# Patient Record
Sex: Female | Born: 1970
Health system: Southern US, Community
[De-identification: ages and names within clinical notes are randomized; demographics above are authoritative.]

## PROBLEM LIST (undated history)

## (undated) DIAGNOSIS — K219 Gastro-esophageal reflux disease without esophagitis: Secondary | ICD-10-CM

## (undated) DIAGNOSIS — K5792 Diverticulitis of intestine, part unspecified, without perforation or abscess without bleeding: Secondary | ICD-10-CM

## (undated) DIAGNOSIS — C801 Malignant (primary) neoplasm, unspecified: Secondary | ICD-10-CM

## (undated) HISTORY — PX: SMALL INTESTINE SURGERY: SHX150

## (undated) HISTORY — PX: LAPAROSCOPIC BILATERAL SALPINGO OOPHERECTOMY: SHX5890

## (undated) HISTORY — DX: Diverticulitis of intestine, part unspecified, without perforation or abscess without bleeding: K57.92

## (undated) HISTORY — PX: REMOVAL OF GASTROINTESTINAL STOMATIC  TUMOR OF STOMACH: SHX6339

## (undated) HISTORY — PX: SPLENECTOMY, TOTAL: SHX788

## (undated) HISTORY — PX: COLON SURGERY: SHX602

## (undated) HISTORY — PX: ABDOMINAL HYSTERECTOMY: SHX81

---

## 2000-10-20 ENCOUNTER — Ambulatory Visit (HOSPITAL_COMMUNITY): Admission: RE | Admit: 2000-10-20 | Discharge: 2000-10-20 | Payer: Self-pay | Admitting: Internal Medicine

## 2000-10-20 ENCOUNTER — Encounter: Payer: Self-pay | Admitting: Internal Medicine

## 2000-11-02 ENCOUNTER — Encounter: Admission: RE | Admit: 2000-11-02 | Discharge: 2000-11-02 | Payer: Self-pay | Admitting: Oncology

## 2000-11-02 ENCOUNTER — Encounter (HOSPITAL_COMMUNITY): Payer: Self-pay | Admitting: Oncology

## 2000-11-02 ENCOUNTER — Encounter (HOSPITAL_COMMUNITY): Admission: RE | Admit: 2000-11-02 | Discharge: 2000-12-02 | Payer: Self-pay | Admitting: Oncology

## 2000-11-03 ENCOUNTER — Emergency Department (HOSPITAL_COMMUNITY): Admission: EM | Admit: 2000-11-03 | Discharge: 2000-11-03 | Payer: Self-pay | Admitting: *Deleted

## 2001-02-28 ENCOUNTER — Emergency Department (HOSPITAL_COMMUNITY): Admission: EM | Admit: 2001-02-28 | Discharge: 2001-02-28 | Payer: Self-pay | Admitting: Emergency Medicine

## 2001-03-26 ENCOUNTER — Inpatient Hospital Stay (HOSPITAL_COMMUNITY): Admission: EM | Admit: 2001-03-26 | Discharge: 2001-03-28 | Payer: Self-pay | Admitting: *Deleted

## 2001-08-17 ENCOUNTER — Inpatient Hospital Stay (HOSPITAL_COMMUNITY): Admission: RE | Admit: 2001-08-17 | Discharge: 2001-08-20 | Payer: Self-pay | Admitting: Obstetrics and Gynecology

## 2001-08-23 ENCOUNTER — Observation Stay (HOSPITAL_COMMUNITY): Admission: AD | Admit: 2001-08-23 | Discharge: 2001-08-24 | Payer: Self-pay | Admitting: Obstetrics & Gynecology

## 2001-08-23 ENCOUNTER — Encounter: Payer: Self-pay | Admitting: Obstetrics & Gynecology

## 2001-09-12 ENCOUNTER — Ambulatory Visit (HOSPITAL_COMMUNITY): Admission: RE | Admit: 2001-09-12 | Discharge: 2001-09-12 | Payer: Self-pay | Admitting: Family Medicine

## 2001-09-12 ENCOUNTER — Encounter: Payer: Self-pay | Admitting: Family Medicine

## 2002-08-17 ENCOUNTER — Encounter: Payer: Self-pay | Admitting: Family Medicine

## 2002-08-17 ENCOUNTER — Ambulatory Visit (HOSPITAL_COMMUNITY): Admission: RE | Admit: 2002-08-17 | Discharge: 2002-08-17 | Payer: Self-pay | Admitting: Family Medicine

## 2003-04-23 ENCOUNTER — Emergency Department (HOSPITAL_COMMUNITY): Admission: EM | Admit: 2003-04-23 | Discharge: 2003-04-23 | Payer: Self-pay | Admitting: Emergency Medicine

## 2003-10-05 ENCOUNTER — Ambulatory Visit (HOSPITAL_COMMUNITY): Admission: RE | Admit: 2003-10-05 | Discharge: 2003-10-05 | Payer: Self-pay | Admitting: Family Medicine

## 2003-10-27 ENCOUNTER — Emergency Department (HOSPITAL_COMMUNITY): Admission: EM | Admit: 2003-10-27 | Discharge: 2003-10-27 | Payer: Self-pay | Admitting: Emergency Medicine

## 2003-11-23 ENCOUNTER — Ambulatory Visit (HOSPITAL_COMMUNITY): Admission: RE | Admit: 2003-11-23 | Discharge: 2003-11-23 | Payer: Self-pay | Admitting: Family Medicine

## 2003-12-03 ENCOUNTER — Ambulatory Visit (HOSPITAL_COMMUNITY): Admission: RE | Admit: 2003-12-03 | Discharge: 2003-12-03 | Payer: Self-pay | Admitting: Family Medicine

## 2003-12-25 ENCOUNTER — Emergency Department (HOSPITAL_COMMUNITY): Admission: EM | Admit: 2003-12-25 | Discharge: 2003-12-25 | Payer: Self-pay | Admitting: *Deleted

## 2004-07-20 ENCOUNTER — Emergency Department (HOSPITAL_COMMUNITY): Admission: EM | Admit: 2004-07-20 | Discharge: 2004-07-20 | Payer: Self-pay | Admitting: Emergency Medicine

## 2004-11-17 ENCOUNTER — Emergency Department (HOSPITAL_COMMUNITY): Admission: EM | Admit: 2004-11-17 | Discharge: 2004-11-17 | Payer: Self-pay | Admitting: Emergency Medicine

## 2005-07-06 ENCOUNTER — Ambulatory Visit (HOSPITAL_COMMUNITY): Admission: RE | Admit: 2005-07-06 | Discharge: 2005-07-06 | Payer: Self-pay | Admitting: Family Medicine

## 2006-03-26 ENCOUNTER — Ambulatory Visit (HOSPITAL_COMMUNITY): Admission: RE | Admit: 2006-03-26 | Discharge: 2006-03-26 | Payer: Self-pay | Admitting: Podiatry

## 2006-07-04 ENCOUNTER — Emergency Department (HOSPITAL_COMMUNITY): Admission: EM | Admit: 2006-07-04 | Discharge: 2006-07-04 | Payer: Self-pay | Admitting: Emergency Medicine

## 2006-08-10 ENCOUNTER — Encounter (INDEPENDENT_AMBULATORY_CARE_PROVIDER_SITE_OTHER): Payer: Self-pay | Admitting: Specialist

## 2006-08-10 ENCOUNTER — Other Ambulatory Visit: Admission: RE | Admit: 2006-08-10 | Discharge: 2006-08-10 | Payer: Self-pay | Admitting: General Surgery

## 2006-08-16 ENCOUNTER — Ambulatory Visit: Payer: Self-pay | Admitting: Orthopedic Surgery

## 2006-08-19 ENCOUNTER — Ambulatory Visit (HOSPITAL_COMMUNITY): Admission: RE | Admit: 2006-08-19 | Discharge: 2006-08-19 | Payer: Self-pay | Admitting: Orthopedic Surgery

## 2006-08-26 ENCOUNTER — Ambulatory Visit: Payer: Self-pay | Admitting: Orthopedic Surgery

## 2007-09-04 ENCOUNTER — Ambulatory Visit: Payer: Self-pay | Admitting: Cardiology

## 2007-09-04 ENCOUNTER — Inpatient Hospital Stay (HOSPITAL_COMMUNITY): Admission: EM | Admit: 2007-09-04 | Discharge: 2007-09-05 | Payer: Self-pay | Admitting: Emergency Medicine

## 2007-09-09 ENCOUNTER — Ambulatory Visit (HOSPITAL_COMMUNITY): Admission: RE | Admit: 2007-09-09 | Discharge: 2007-09-09 | Payer: Self-pay | Admitting: Cardiology

## 2007-09-09 ENCOUNTER — Encounter: Payer: Self-pay | Admitting: Cardiology

## 2008-02-10 ENCOUNTER — Ambulatory Visit (HOSPITAL_COMMUNITY): Admission: RE | Admit: 2008-02-10 | Discharge: 2008-02-10 | Payer: Self-pay | Admitting: General Surgery

## 2008-10-18 ENCOUNTER — Emergency Department (HOSPITAL_COMMUNITY): Admission: EM | Admit: 2008-10-18 | Discharge: 2008-10-18 | Payer: Self-pay | Admitting: Emergency Medicine

## 2009-09-13 ENCOUNTER — Emergency Department (HOSPITAL_COMMUNITY): Admission: EM | Admit: 2009-09-13 | Discharge: 2009-09-13 | Payer: Self-pay | Admitting: Emergency Medicine

## 2010-06-08 ENCOUNTER — Emergency Department (HOSPITAL_COMMUNITY)
Admission: EM | Admit: 2010-06-08 | Discharge: 2010-06-08 | Payer: Self-pay | Source: Home / Self Care | Admitting: Emergency Medicine

## 2010-06-16 ENCOUNTER — Ambulatory Visit (HOSPITAL_COMMUNITY)
Admission: RE | Admit: 2010-06-16 | Discharge: 2010-06-16 | Payer: Self-pay | Source: Home / Self Care | Attending: Plastic Surgery | Admitting: Plastic Surgery

## 2010-07-19 NOTE — Op Note (Signed)
Peggy Franklin, Peggy Franklin                 ACCOUNT NO.:  [id number]  MEDICAL RECORD NO.:  [id number]          PATIENT TYPE:  AMB  LOCATION:  SDS                          FACILITY:  [facility]  PHYSICIAN:  Loreta Ave, MD DATE OF BIRTH:  1970/10/07  DATE OF PROCEDURE:  06/16/2010 DATE OF DISCHARGE:  06/16/2010                              OPERATIVE REPORT   PREOPERATIVE DIAGNOSIS:  Right distal radius fracture, intraarticular, 2- piece.  POSTOPERATIVE DIAGNOSIS:  Right distal radius fracture, intraarticular, 2-piece.  SURGEON:  Loreta Ave, MD  ASSISTANT:  Tonye Becket, nurse practitioner.  ANESTHESIA:  General.  TOURNIQUET TIME:  54 minutes at 250 mmHg.  ESTIMATED BLOOD LOSS:  Minimal.  CLINICAL INDICATION:  Peggy Franklin is a 40 year old female who sustained intraarticular and comminuted right distal radius fracture approximately 1 week ago.  I saw her in the outpatient setting as the hand surgeon on- call, evaluated her on physical examination and x-rays, and recommended operative treatment.  She understood the risks of surgery to include but not be limited to bleeding, infection, damage to nearby structures including bone, tendon, muscles, nerves, cartilage as well as stiffness, scarring, nonunion, and malunion.  She desired to proceed.  DESCRIPTION OF OPERATION:  The patient was brought to the operating room and placed in supine position on the operating room table.  One gram of Ancef was given preoperatively.  A well-padded pneumatic tourniquet was placed on the right arm.  After smooth and routine induction of general anesthesia, the right upper extremity was prepped with ChloraPrep and draped into a sterile field.  A standard volar Henry incision was made with a #15 blade and carried down through the skin and subcutaneous tissues sharply.  Superficial vessels were controlled with bipolar electrocautery.  The anterior aspect of the FCR tendon sheath was incised with a  #15 blade and the FCR tendon was retracted ulnarly.  The radial artery was retracted radially.  Sharp dissection revealed the flexor pollicis longus muscle belly which was retracted ulnarly.  The pronator quadratus was incised longitudinally with a #15 blade and dissected off the fracture fragment sharply in a subperiosteal fashion with a Art therapist.  The fracture was identified as being intraarticular and having 2 fracture fragments.  There was a butterfly piece of the volar aspect which was dislodged.  This volar component was elevated and using a combination of curette, rongeur, and a Therapist, nutritional the fracture fragments were freed up and provisionally reduced manually.  Next, a long narrow Acu-Loc volar distal radius plate was placed on the fracture.  A long plate was chosen because of the volar fracture fragment would have been in the region of the more distal screw holes in the shaft of the plate.  Next, the fracture was provisionally affixed to the plate with K-wires and assessed with fluoroscopy. Reduction and hardware alignment were found to be adequate.  Next, a 2.8- mm drill was used in the oblong hole of the shaft of the plate.  This was measured and a 3.5 nonlocking screw was placed which affixed to the plate to the radial shaft.  Next, the radial styloid  holes were drilled, measured and fully-threaded locking screws were then placed.  Next, the ulnar most distal hole was drilled, measured and a fully-threaded nonlocking screw was placed.  The distal most radial hole was then drilled, measured and a fully-threaded nonlocking screw was placed.  The remainder of the distal holes were drilled and were filled with fully- threaded locking screws.  Next, the shaft of the plate was affixed to the radial shaft.  This was done with 3.5 locking screws placed in a bicortical fashion.  Next, the entire construct with subsequent PA and lateral planes and reduction and hardware alignment  were found to be adequate.  Sponge and needle counts were reported as correct x2.  The wound was irrigated with normal saline and the pronator quadratus was repaired with interrupted 4-0 buried Monocryl sutures.  The skin was closed with buried interrupted 4-0 Monocryl sutures.  Steri-Strips, Xeroform as well as a dry sterile dressing, and a volar resting splint were then applied.  The patient tolerated the procedure well, was extubated and transferred to the recovery room in stable condition.     Loreta Ave, MD     CF/MEDQ  D:  06/16/2010  T:  06/17/2010  Job:  [id number]  Electronically Signed by Loreta Ave MD on 07/19/2010 03:16:08 PM

## 2010-09-01 LAB — SURGICAL PCR SCREEN: Staphylococcus aureus: NEGATIVE

## 2010-09-01 LAB — CBC
Hemoglobin: 14.9 g/dL (ref 12.0–15.0)
MCV: 91 fL (ref 78.0–100.0)
RBC: 4.65 MIL/uL (ref 3.87–5.11)
RDW: 12.1 % (ref 11.5–15.5)
WBC: 9.1 10*3/uL (ref 4.0–10.5)

## 2010-10-01 LAB — DIFFERENTIAL
Eosinophils Relative: 1 % (ref 0–5)
Monocytes Relative: 6 % (ref 3–12)
Neutro Abs: 3.4 10*3/uL (ref 1.7–7.7)
Neutrophils Relative %: 62 % (ref 43–77)

## 2010-10-01 LAB — CBC
Hemoglobin: 13.3 g/dL (ref 12.0–15.0)
MCHC: 35.3 g/dL (ref 30.0–36.0)
RBC: 4.17 MIL/uL (ref 3.87–5.11)

## 2010-10-01 LAB — COMPREHENSIVE METABOLIC PANEL
Alkaline Phosphatase: 60 U/L (ref 39–117)
BUN: 7 mg/dL (ref 6–23)
CO2: 26 mEq/L (ref 19–32)
Chloride: 100 mEq/L (ref 96–112)
Glucose, Bld: 80 mg/dL (ref 70–99)
Potassium: 3.5 mEq/L (ref 3.5–5.1)
Total Bilirubin: 0.7 mg/dL (ref 0.3–1.2)
Total Protein: 6 g/dL (ref 6.0–8.3)

## 2010-10-01 LAB — URINALYSIS, ROUTINE W REFLEX MICROSCOPIC
Bilirubin Urine: NEGATIVE
Glucose, UA: NEGATIVE mg/dL
Nitrite: NEGATIVE
Protein, ur: NEGATIVE mg/dL

## 2010-11-04 NOTE — H&P (Signed)
Peggy Franklin, Peggy Franklin                 ACCOUNT NO.:  [id number]   MEDICAL RECORD NO.:  [id number]          PATIENT TYPE:  INP   LOCATION:  A209                          FACILITY:  [facility]   PHYSICIAN:  Donna Bernard, M.D.DATE OF BIRTH:  May 12, 1971   DATE OF ADMISSION:  09/04/2007  DATE OF DISCHARGE:  LH                              HISTORY & PHYSICAL   CHIEF COMPLAINT:  Chest pain.   HISTORY OF PRESENT ILLNESS:  This patient is a 40 year old female who  presented to the emergency room on the day of admission with complaints  of chest pain.  She describes it as an elephant sitting on my chest.  It is primarily substernal, radiating to the left side, and deep.  The  patient did have some nausea with it, along with a sense of shortness of  breath.  There was no diaphoresis.  The patient does note that she had a  sister die of heart disease.  She also had a father who had early  bypass surgery.  The patient does smoke a pack per day.  She claims  minimal reflux symptomatology.  The patient notes that after  nitroglycerin sublingual x2 the patient had complete resolution of her  symptoms.   CURRENT MEDICATIONS:  None.   ALLERGIES:  MEDROL.   PAST SURGICAL HISTORY:  Remote cesarean section, hysterectomy, and  oophorectomy.   SOCIAL HISTORY:  No alcohol abuse.  Positive tobacco abuse.   REVIEW OF SYSTEMS:  Negative.   PHYSICAL EXAMINATION:  VITAL SIGNS:  Blood pressure 116/64, pulse 82,  respiratory rate 16.  Initial O2 saturation 97%.  GENERAL:  The patient is alert, positive anxiety.  HEENT:  Normal.  TM's normal.  Oropharynx normal.  NECK:  Supple.  LUNGS:  Clear.  HEART:  Regular rate and rhythm.  ABDOMEN:  Epigastrium tender to palpation.  No rebound and no guarding.  Good bowel sounds.  EXTREMITIES:  Normal.  NEUROLOGY:  Intact.   LABORATORY DATA:  Cardiac enzymes x1 negative.  Initial EKG nonspecific  T wave changes anteriorly, no significant T segment changes.   Portable  chest x-ray negative.   MET-7 within normal limits.  CBC within normal limits.   IMPRESSION:  Chest pain.  The patient gives a classic description for  angina-like pain.  She also has very significant risk factors with a  sibling with heart disease a father with premature coronary artery  disease and a personal smoking habit.  With the epigastric tenderness,  the probability is that this chest pain was esophageal spasm, however,  there is a possibility of underlying coronary ischemia as an etiology  also.  This all was discussed in length with the patient who  understandably is upset in part of her uninsure status.   PLAN:  Admit to the hospital with serial cardiac enzymes, CCMT, close  monitoring, nitroglycerin p.r.n., morphine p.r.n., and Zofran p.r.n.  Check EKG in the morning.  Cardiology consult.  Likely will need a  stress test after infarction is ruled out.  We will also cover with  Lovenox and aspirin.  Donna Bernard, M.D.  Electronically Signed     WSL/MEDQ  D:  09/04/2007  T:  09/04/2007  Job:  [id number]

## 2010-11-04 NOTE — Consult Note (Signed)
Peggy Franklin, RENNINGER                 ACCOUNT NO.:  [id number]   MEDICAL RECORD NO.:  [id number]          PATIENT TYPE:  INP   LOCATION:  A209                          FACILITY:  [facility]   PHYSICIAN:  Gerrit Friends. Dietrich Pates, MD, FACCDATE OF BIRTH:  05-Feb-1971   DATE OF CONSULTATION:  09/05/2007  DATE OF DISCHARGE:                                 CONSULTATION   HISTORY OF PRESENT ILLNESS:  A 40 year old woman admitted to the  hospital with 24 hours of chest discomfort.  Ms. Dezeeuw has no prior  cardiac history.  She has been a cigarette smoker, but has recently  tapered usage down to approximately one-quarter pack per day.  She has  not been told of lipid abnormalities in the past.  She has not had  diabetes.  There is a family history of coronary disease in her father  and a history of sudden death in a sister at age 11.  She had premature  menopause, having undergone unilateral oophorectomies in 2000 and then  again in 2003.  She ultimately had a hysterectomy.   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  This did not prevent her from continuing her work.  When she returned  home, she laid down, initially with improvement; however, symptoms  recurred.  When she returned to work the following day, her boss advised  her to return home.  She decided to go to the emergency department from  whence she was admitted.  The discomfort has radiated through to the  back.  There has been no associated dyspnea, diaphoresis or nausea.  She  did have some epigastric tenderness.  She has no history of  gastrointestinal problems.  She took ibuprofen at one point with  improvement.  She also received nitroglycerin in the emergency  department with uncertain initial results.  She has had no chest  discomfort since hospital admission.   PAST MEDICAL HISTORY:  1. Notable for a prior cesarean section on 3 occasions  plus the other      gynecologic procedures described above.  2. She has also had carpal tunnel surgery bilaterally.   ALLERGIES:  She reports an allergy to MEDROL.   MEDICATIONS:  She takes no medications routinely.   SOCIAL HISTORY:  No excessive use of alcohol; lives locally and has an  autistic son who requires considerable care.   REVIEW OF SYSTEMS:  Notable for a regular diet.  She had gestational  diabetes with all of her pregnancies.  All other systems reviewed and  are negative.   PHYSICAL EXAMINATION:  GENERAL:  A pleasant, well-appearing woman.  VITAL SIGNS:  The temperature is 97.8, blood pressure 100/55, heart rate  65 and regular, respirations 20, O2 saturation 98%.  HEENT:  Extraocular movements are full; normal lids and conjunctivae;  normal oral mucosa.  PSYCHIATRIC:  Alert and oriented; normal affect.  NECK:  No jugular venous distention; normal carotid upstrokes without  bruits.  ENDOCRINE:  No thyromegaly.  HEMATOPOIETIC:  No adenopathy.  LUNGS:  Clear.  CARDIAC:  Normal first and second heart sounds; normal PMI.  ABDOMEN:  Soft and nontender; no organomegaly; no masses.  EXTREMITIES:  Normal distal pulses; no edema.  NEUROLOGIC:  Symmetric strength and tone; normal cranial nerves.   ELECTROCARDIOGRAM:  Normal sinus rhythm; low voltage in the limb leads;  nondiagnostic T-wave abnormalities in leads V2-V3.   Other laboratory is unremarkable.  Chest x-ray is normal.  Cardiac  markers are negative, as is a D-dimer.   IMPRESSION:  Ms. Stanke presents with symptoms compatible with chest pain  of cardiac origin, but with a continuing low risk for coronary disease  despite a positive family history, premature menopause and use of  tobacco products.  The likelihood of a GI origin exceeds that of the  possibility of cardiac disease.  Nonetheless, stress testing is  warranted.  This can be done on an outpatient basis in the form of a  stress echocardiogram.  The  patient will be discharged on an H2 blocker  and sublingual nitroglycerin p.r.n.   I will plan to see this nice woman again in the office after stress  testing has been completed.  We greatly appreciate the opportunity to  participate in her care.      Gerrit Friends. Dietrich Pates, MD, [facility]  Electronically Signed     RMR/MEDQ  D:  09/05/2007  T:  09/05/2007  Job:  [id number]

## 2010-11-04 NOTE — Discharge Summary (Signed)
NAMECLOIE, WOODEN                 ACCOUNT NO.:  [id number]   MEDICAL RECORD NO.:  [id number]          PATIENT TYPE:  INP   LOCATION:  A209                          FACILITY:  [facility]   PHYSICIAN:  Scott A. Gerda Diss, MD    DATE OF BIRTH:  1971/03/31   DATE OF ADMISSION:  09/04/2007  DATE OF DISCHARGE:  03/16/2009LH                               DISCHARGE SUMMARY   DISCHARGE DIAGNOSES:  1. Chest pain, problem noncardiac.  2. Family history of heart disease.  3. Tobacco abuse.  4. Probable esophageal reflux.   Was sent home with the orders of no smoking, Zantac by mouth, and to  follow up for a stress test with Cardiology plus also follow up in the  office in 1 week.   Her hospital course is uneventful.  She did see cardiology, which did  not feel like anything needed to be done while in the hospital other  than what has already been done.  Her enzymes ruled her out for any  heart attack.  She was sent home in good condition.      Scott A. Gerda Diss, MD  Electronically Signed     SAL/MEDQ  D:  10/17/2007  T:  10/17/2007  Job:  [id number]

## 2010-11-04 NOTE — Procedures (Signed)
[name], Peggy Franklin                 ACCOUNT NO.:  [id number]   MEDICAL RECORD NO.:  [id number]          PATIENT TYPE:  OUT   LOCATION:  RAD                           FACILITY:  [facility]   PHYSICIAN:  Gerrit Friends. Dietrich Pates, MD, FACCDATE OF BIRTH:  1971-05-08   DATE OF PROCEDURE:  09/09/2007  DATE OF DISCHARGE:                                ECHOCARDIOGRAM   PROCEDURE:  Stress echocardiogram.   REFERRING PHYSICIAN:  Scott A. Gerda Diss, MD   CLINICAL DATA:  A 40 year old woman with multiple cardiovascular risk  factors recently admitted to hospital with chest pain.  Myocardial  infarction ruled out.   1. Treadmill exercise performed to a workload of 10 METS and a heart      rate of 178, 97% of age predicted maximum.  Exercise discontinued      due to fatigue and dyspnea; no chest discomfort reported.  2. Blood pressure increased from a resting value of 110/60 to 120/60      early in recovery, a flat response.  3. No arrhythmias noted.  4. Baseline EKG:  Normal sinus rhythm; minor nonspecific T-wave      abnormality.   Stress EKG:  Interpretation impaired by baseline wander and artifact; no  definite ST-segment abnormalities identified.  Baseline echocardiogram  shows normal chamber dimensions, normal aortic and mitral valves and  normal right and left ventricular function.   Post exercise imaging:  There is a clear increase in contractility in  the inferior and posterolateral regions.  There is little change noted  in the anterior and anterolateral segments.   IMPRESSION:  Probably negative stress echocardiogram revealing good  exercise tolerance, no reproduction of the patient's chest discomfort,  no diagnostic stress induced EKG abnormalities and a nondiagnostic  echocardiographic response in the anterior myocardium.  Other myocardial  region showed no evidence for ischemia or infarction.      Gerrit Friends. Dietrich Pates, MD, [facility]  Electronically Signed     RMR/MEDQ  D:  09/09/2007  T:   09/09/2007  Job:  [id number]

## 2010-11-04 NOTE — Group Therapy Note (Signed)
[name], Peggy Franklin                 ACCOUNT NO.:  [id number]   MEDICAL RECORD NO.:  [id number]          PATIENT TYPE:  INP   LOCATION:  A209                          FACILITY:  [facility]   PHYSICIAN:  Scott A. Gerda Diss, MD    DATE OF BIRTH:  June 01, 1971   DATE OF PROCEDURE:  09/05/2007  DATE OF DISCHARGE:                                 PROGRESS NOTE   Overnight she did pretty well.  She did not have any chest pressure,  pain, discomfort or shortness of breath.  She denies any nausea,  vomiting.  She has eaten a little bit of food.  Her lab results came  back looking cardiac enzymes all being negative.   PHYSICAL EXAMINATION:  LUNGS:  Clear.  No crackles, no respiratory  distress.  CHEST:  Chest wall nontender.  HEART:  Regular, no murmurs.  ABDOMEN:  Soft, no epigastric tenderness.   A/P chest pain - resolved.  Overall I feel there is a low likelihood of  cardiac disease, but because of her risk factors it does need to be  ruled out.  Certainly this could be tested while in the hospital or  tested as an outpatient.  Cardiology to see today and help guide that.  Certainly if they feel that testing can be done as an outpatient we will  let her go home today, and follow her up closely in the office as well  as cardiology following her up.  In addition to all of this I have  encouraged her to quit smoking.  I also feel omeprazole as an outpatient  one per day would be a reasonable thing to do, and certainly work up  gallbladder and erosive esophageal disease could be done as well  depending on clinical course.      Scott A. Gerda Diss, MD  Electronically Signed     SAL/MEDQ  D:  09/05/2007  T:  09/05/2007  Job:  [id number]

## 2010-11-07 NOTE — Op Note (Signed)
[facility]  Patient:    Peggy Franklin, Peggy Franklin Visit Number: [id number] MRN: [id number]          Service Type: OUT Location: RAD Attending Physician:  Darlin Priestly Dictated by:   Christin Bach, M.D. Proc. Date: 08/17/01 Admit Date:  09/12/2001                             Operative Report  ADMITTING DIAGNOSES:  Right ovarian cyst; history of extensive pelvic adhesions.  POSTOPERATIVE DIAGNOSIS:  Right ovarian cyst.  PROCEDURE:  Right salpingo-oophorectomy.  SURGEON:  Christin Bach, M.D.  ASSISTANTMarlinda Mike, R.N.  ANESTHESIA:  General.  COMPLICATIONS:  None.  ESTIMATED BLOOD LOSS:  200 cc.  FINDINGS:  Omental adhesions to anterior abdominal wall and bladder abdominal area, and diffuse oozing throughout the lower abdomen. JP drains noted, and JP drain in the pelvis necessary.  DETAILS OF PROCEDURE:  The patient was taken to the operating room and prepped and draped in the usual fashion for lower abdominal surgery. The previous lower abdominal incision, a vertical laparotomy incision was performed at the site of the previous incision in October of last year. Some technical challenges were encountered identifying the abdominal cavity as we encountered omental fat and had some challenges separating it from the anterior abdominal wall. At no time was bowel jeopardized by this process. The bowel had been prepped the night before. A tendency toward diffuse oozing was encountered throughout the procedure. The omentum was gradually dissected free from the peritoneal surfaces and dissected free on either side. The tip of the omentum was adherent all the way down to the bladder dome area. Some of it was so dense as to be difficult to delineate from the bladder itself, so we worked around these adhesions. The right adnexa could be identified, and the tube and ovary identified on the pelvic sidewall and sharply dissected free from adhesions to the  sidewall. The retroperitoneal space was entered between the round ligament and the pelvic sidewall, and the infundibulopelvic ligament isolated. The ureter was identified retroperitoneally and confirmed as being out of harms way. The IP ligament was then clamped, cut, and suture ligated. The tube and ovary were then shelled off of the sidewall with ease, with Kelly clamps used to crossclamp the remaining attachments to the remnants of the round ligament. These were transected, tied down, and the procedure was considered satisfactorily completed. Diffuse oozing required point cautery as applicable, but was considered sufficient, but the JP drain was considered necessary. No frank bleeding was encountered during this portion of the case. We then prepped, we then placed the JP drain through a second stabbing through the inferior aspects of the incision and closed the peritoneal cavity with 2-0 chromic, the fascia with continuous 0 Flexon, and subcu tissues were reapproximated using interrupted 2-0 plain. Staple closure of the skin completed the procedure with an estimated blood loss of 200 cc. Dictated by:   Christin Bach, M.D. Attending Physician:  Darlin Priestly DD:  09/12/01 TD:  09/13/01 Job: [id number]

## 2010-11-07 NOTE — H&P (Signed)
[facility]  Patient:    Peggy Franklin, Peggy Franklin Visit Number: [id number] MRN: [id number]          Service Type: Attending:  Christin Bach, M.D. Dictated by:   Christin Bach, M.D.                           History and Physical    ADMITTING DIAGNOSIS:  Painful right ovarian cyst, history of extensive pelvic adhesions scheduled for laparotomy, right salpingo-oophorectomy on August 17, 2001.  HISTORY OF PRESENT ILLNESS:  This 40 year old female now four months status post laparotomy for ovarian cyst and infarcted epiploic fat with adhesions is admitted at this time for removal of the remaining ovary which is on the right.  [name] has been seen in our office on August 15, 2001, after severe right-sided pain.  She "feels just like the left side did before her surgery in 2002."  She had to leave home from work over the weekend.  She is "sick and tired of the pain."  Seen in our office, she is revealing a 3.9 x 4.4 x 3.7 cm right ovary with multiple cysts up to 1.7 cm in diameter.  This is immobile and adherent to the right side wall.  Contact with the ultrasound probe in the area of the ovary reproduces the pelvic pain.  The patient has already had laparotomy removal of the left ovary and identification infarcted epiploic fat of the sigmoid colon.  She also has a small hernia in the pelvic floor.  The omentum was adherent to the anterior abdominal wall.  Laparotomy was performed through a vertical abdominal incision.  The patient is aware that surgical removal of the ovary will result in surgical menopause and that she will be having to decide whether to take hormone therapy due to surgical menopause. Patient is very comfortable with the idea of hormone replacement therapy.  PAST MEDICAL HISTORY:  Benign surgical history.  C sections x 3, abdominal hysterectomy.  ALLERGIES:  No known drug allergies.  MEDICATIONS:  Lortab, Motrin p.r.n., Tylox prescription  written two days ago with preoperative visit.  PHYSICAL EXAMINATION: VITAL SIGNS:  Height 5 feet 5 inches.  Weight 141.  Blood presure 100/60. GENERAL:  A slim Caucasian female in obvious discomfort.  Alert and oriented x 3.  CARDIOVASCULAR:  Unremarkable.  ABDOMEN:  Well-healed midline abdominal scar.  GENITALIA:  Normal. PELVIC:  Vaginal exam normal secretions.  Cuff smooth and well healed.  LABORATORY DATEA:  Vaginal ultrasound performed showed previously mentioned 3.7 x 3.9 x 4.4 cm right ovary cyst.  PLAN:  Right salpino-oophorectomy through laparotomy incision on August 16, 2001. Dictated by:   Christin Bach, M.D. Attending:  Christin Bach, M.D. DD:  08/16/01 TD:  08/16/01 Job: [id number]

## 2010-11-07 NOTE — Op Note (Signed)
[facility]  Patient:    Peggy Franklin, Peggy Franklin Visit Number: [id number] MRN: [id number]          Service Type: MED Location: 4 A428 01 Attending Physician:  Tilda Burrow Dictated by:   Christin Bach, M.D. Admit Date:  03/26/2001 Discharge Date: 03/28/2001   CC:         Barbaraann Barthel, M.D.   Operative Report  PREOPERATIVE DIAGNOSIS:  Pelvic pain, suspected left ovarian cyst, rule out adnexal torsion.  POSTOPERATIVE DIAGNOSES:  Pelvic pain secondary to left ovarian cyst, multiple adhesions, infarcted epiploic fat of sigmoid colon, pelvic floor hernia.  PROCEDURE:  Diagnostic laparoscopy, laparotomy with left salpingo-oophorectomy, lysis of adhesions, appendectomy, closure of pelvic floor hernia.  SURGEON:  Christin Bach, M.D.  ASSISTANTAmie Critchley, CST  INTRAOPERATIVE CONSULTANT AND SUBSEQUENT ASSISTANT:  Barbaraann Barthel, M.D.  COMPLICATIONS:  Extensive adhesions of omentum to the entire anterior abdominal wall prevented any visibility of the pelvis.  Underside of omentum was clear of adhesions.  Adnexal structures completely obscured.  DETAILS OF PROCEDURE:  The patient was taken to the operating room and prepped and draped for combined abdominal and vaginal procedure, with Foley catheter inserted and single-tooth tenaculum attached to the vaginal apex for orientation and identification of the vaginal cuff.  The legs were supported in yellow fin leg supports and the abdomen prepped and draped.  Infraumbilical vertical 1-cm skin incision was made, approximately 2 cm above the upper aspects of the midline abdominal incisions, and Veress needle introduced and water drop technique used to confirm intraperitoneal placement.  Insufflation of 3 L CO2 was performed without difficulty and then the laparoscopic trocar introduced with orientation toward the pelvis.  Visualization inside revealed that we were beneath an extremely large widespread  omentum which was attached through to the anterior abdominal wall from one side to the other and all the way to the pelvic floor.  The small bowel could be seen under it and was normal in appearance except for a small bit of CO2, which was in the mesentery to the small bowel, related to the insufflation process.  We looked around and after some manipulation of the omentum from underneath, realized that there would be absolutely no ability to free up the omentum laparoscopically.  We then decided to convert to open procedure.  The abdomen was kept inflated, the umbilical trocar left in place, the cameras and laparoscopic equipment removed and the patient opened through the prior vertical lower abdominal incision with wide excision of the old cicatrix. Peritoneal cavity was entered with some significant difficulty due to the extensive matted adhesions to the anterior abdominal wall, which were dissected free gradually.  Most of these adhesions were old and once the cleavage planes could be identified, the dissection went quite well.  The tip of the omentum was thickened and somewhat indurated and stuck down in the left lower quadrant over a palpable mass effect.  Balfour retractor was in place and attention directed to the pelvis.  As we gradually dissected the adhesions free, the left adnexa could be identified.  The omentum was dissected off of the top of the sigmoid colon, which was attached to the top of the left ovary. Upon freeing up the bowel, there was a thickened, matted, indurated portion of the sigmoid which was quite lax and mobile once freed up, but it showed extensive reddened erythema and some ecchymosis over its surface that was long-standing.  Original impression was that it perhaps represented an inflammatory  process such as diverticulitis.  The epiploic fatty appendages along the side of the sigmoid colon were matted over the top of the bowel surface.  A decision was made  to obtain intraoperative consult to confirm that this was a surface inflammatory process and not intrinsic bowel problem.  A call was made to Dr. Scarlett Presto for intraoperative consultation.  During the time of his arrival, we proceeded with surgical evaluation of the pelvis, which had included complete mobilization of the left tube and ovary from the pelvic sidewall, identification of the left ureter, identification of a retroperitoneal site that the IP ligament could be isolated, and gradual careful dissection of the epiploic fat appendages free from the surface of the sigmoid colon.  As we dissected the fat free from the underlying colon, it was obvious that the problem was the surface of the epiploic fat rather than the bowel itself.  It appeared that one of the epiploic fat appendages had become infarcted from its entrapment beneath the adjacent adhesions and that this was resulting in the fairly significant inflammatory process.  This was confirmed by Dr. Malvin Johns upon his arrival.  Rest of pelvis was inspected by Dr. Malvin Johns in his intraoperative consult note.  Decision was made to proceed with appendectomy as well as the left salpingo-oophorectomy.  In a combined fashion, Dr. Malvin Johns and I then proceeded to remove the appendix using standard technique after the bowel had been explored along its entirety manually and visually as careful as possible.  The right upper quadrant did not have any adhesions.  The Endo GIA was used to cross the appendiceal base.  The mesoappendix portions were individually ligated with 2-0 silk.  The left tube and ovary were placed on traction with Balfour retractor, the ureter carefully identified to ensure its being out of the surgical field and the IP ligament -- infundibulopelvic ligament -- on the left isolated, doubly clamped with Kelly clamps, transected and ligated with 0 chromic.  The left tube and ovary were then shaved off of the  remaining adhesions to the sidewall  and two small areas of adhesions grasped with Kelly clamps, transected and then tied with 0 chromic.  We stayed out of the retroperitoneum.  The ureter was felt well out of harms way throughout this portion of the procedure.  The pelvis was irrigated copiously, inspected and a small 2 x 1 x 2-cm deep hernia defect on the left side of the colon behind the vagina noted.  This was closed with a couple of stitches of 2-0 chromic placed very superficially to avoid underlying structures.  Consideration was given to a Moschcowitz procedure, as the patient had extreme pelvic laxity.  The laxity of the structures was so extensive that we felt that orientation of structures such as ureters, etc, might not be traditional in location, so a decision to try to reduce the pelvic floor weakness was discontinued.  We then proceeded to close the abdomen with continuous running 0 Maxon, followed by subcutaneous irrigation and interrupted 2-0 plain tissue approximation and staple closure of the skin.  Estimated blood loss was 250 cc. Dictated by:   Christin Bach, M.D. Attending Physician:  Tilda Burrow DD:  03/26/01 TD:  03/28/01 Job: [id number]

## 2010-11-07 NOTE — Op Note (Signed)
[facility]  Patient:    Peggy Franklin, Peggy Franklin Visit Number: [id number] MRN: [id number]          Service Type: MED Location: 4 A428 01 Attending Physician:  Tilda Burrow Dictated by:   Barbaraann Barthel, M.D. Proc. Date: 03/26/01 Admit Date:  03/26/2001 Discharge Date: 03/28/2001   CC:         Christin Bach, M.D.   Operative Report  SURGEON:  Christin Bach, M.D.  ASSISTANT:  Barbaraann Barthel, M.D.  NOTE:  This is a 40 year old female who was taken to surgery on Dr. Robinette Haines service.  An intraoperative consult was requested and surgery responded immediately.  Dr. Emelda Fear had an attempted laparoscopic procedure, encountered dense adhesions and then later opened this patient infraumbilically in the midline, where he noted that the rectosigmoid appendices epiploicae were scarred and adhesed and edematous.  This was related to the adhesions to the left adnexa. I examined the bowel with him and concurred and no further bowel surgery was necessary.  I assisted in lysing with the adhesions and we then proceeded with a left salpingo-oophorectomy.  For details of this procedure, consult Dr. Robinette Haines notes.  I also suggested the need for an incidental appendectomy, as palpated fecaliths were noted, and it is likely that this female may very well encounter pelvic pain, in which the presence of an appendix does not need to add to the diagnostic dilemma.  This was done, ligating the mesoappendix with 2-0 silk and using the TA-30 stapler device.  An omental biopsy was performed.  The wound was then irrigated and abdominal closure was obtained. For details of the operative procedure, see Dr. Robinette Haines notes.  SUGGESTIONS:  In order to thoroughly clarify the status of the bowel, I suggested colonoscopy in the postoperative period, #1, and also suggested omental biopsy. Dictated by:   Barbaraann Barthel, M.D. Attending Physician:  Tilda Burrow DD:   03/26/01 TD:  03/28/01 Job: [id number]

## 2010-11-07 NOTE — H&P (Signed)
[facility]  Patient:    Peggy Franklin, Peggy Franklin Visit Number: [id number] MRN: [id number]          Service Type: OBV Location: 3A A336 01 Attending Physician:  Lazaro Arms Dictated by:   Duane Lope, M.D. Admit Date:  08/23/2001                           History and Physical  HISTORY OF PRESENT ILLNESS:  The patient is a 40 year old white female, gravida 3, para 3, who has had now six laparotomy surgeries, three C sections, abdominal hysterectomy, left salpingo-oophorectomy and subsequently last week a right salpingo-oophorectomy.   The patient underwent surgery again approximately one week ago by Dr. Emelda Fear and had a relatively uneventful intraoperative and postoperative course.  Complicating matters in general are all the laparotomies the patient has had and, of course, there was abundant adhesive disease found at the time of surgery last week to remove the right tube and ovary.  There were some omental adhesions, some bowel adhesions, some infarcted and incarcerated omental fat and epiploic fat, but the patient had an uneventful postoperative course and in fact was doing quite well as of March 2.  She was at her grandmother.  She was without pain and having normal bowel function.  However, on the night prior to admission on August 22, 2001, the patient stated she was "up all night" with nausea, vomiting, and watery diarrhea.  She denies fever.  She states the emesis has not been bilious, and she also states that she feels more bloated now than she did when she was in the hospital postoperatively.  In fact, she states she feels worse now than she did immediately postoperatively.  She states that no one else in the family has been ill recently.  On examination in the office, she has normal bowel sounds, no rushes, no tinkles.  Her incision is clean, dry, and intact.  There is, I would say, mild distention but certainly not a clear cut case of obstruction.   However, the patient is admitted for observation status, for hydration, laboratory evaluation, radiologic evaluation, and time to see how her symptoms evolve. The patient and her husband are aware of this logic and agree to admission.  PAST MEDICAL HISTORY:  Essentially negative.  PAST SURGICAL HISTORY:  As stated above.  MEDICATIONS:  Tylox for pain, otherwise negative.  REVIEW OF SYSTEMS:  As per HPI, otherwise negative.  PHYSICAL EXAMINATION:  VITAL SIGNS:  Height 5 feet 1 inch, 135 pounds.  HEENT:  Unremarkable.  NECK:  Thyroid is normal.  LUNGS:  Clear.  HEART:  Regular rate and rhythm without regurgitation or gallop.  BREASTS:  Deferred.  ABDOMEN:  As stated above.  She has staples in the midline from her midline incision which is healing quite well with no evidence of infection, abscess, hematoma, or seroma.  Her abdomen is mildly distended.  There are good bowel sounds in all four quadrants.  There is no rebound, and there is no involuntary guarding.  There are no rushes or tinkles.  PELVIC:  Exam was not performed.  EXTREMITIES:  Warm with no edema.  NEUROLOGIC:  Grossly intact.  IMPRESSION: 1. Six days postoperatively from exploratory laparotomy with right    salpingo-oophorectomy with findings at the time of surgery of widespread    peritoneal adhesive disease. 2. New onset nausea, vomiting, and diarrhea.  PLAN:  The patient will be admitted for IV  hydration, laboratory evaluation including comprehensive metabolic panel.  She will have an acute abdominal series.  She will be rehydrated, and we will observe her status n.p.o.  The patient understands this may represent simply a coincidental viral gastroenteritis.  We will also check a C. difficile toxin if she has any more diarrhea.  We will control her nausea with Zofran and given her IV Stadol for pain.  She also understands this may represent an ileus or possibly even early bowel obstruction given her  adhesive disease from last week.  She understands this may take some time to delineate, and she may be in just tonight or a couple of days. Dictated by:   Duane Lope, M.D. Attending Physician:  Lazaro Arms DD:  08/23/01 TD:  08/23/01 Job: [id number]

## 2010-11-07 NOTE — Discharge Summary (Signed)
[facility]  Patient:    Peggy Franklin, Peggy Franklin Visit Number: [id number] MRN: [id number]          Service Type: MED Location: 4 A428 01 Attending Physician:  Tilda Burrow Dictated by:   Christin Bach, M.D. Admit Date:  03/26/2001 Discharge Date: 03/28/2001               Scarlett Presto, M.D.   Discharge Summary  ADMITTING DIAGNOSES:  Left ovarian cyst, rule out ovarian torsion, severe left lower quadrant pain.  DISCHARGE DIAGNOSIS:  Pelvic pain secondary to left ovarian cyst, pericolonic adhesions with infarction of epiploic fat.  PROCEDURE:  Diagnostic laparoscopy, laparotomy with lysis of adhesions, left salpingo-oophorectomy.  Appendectomy and biopsy of omentum.  DISCHARGE MEDICATIONS: 1. Tylox 1 q.4h. p.r.n. pain. 2. Laxative of choice p.o. q.d.  CONSULTANT:  Intraoperative consultant Dr. Scarlett Presto.  BRIEF HISTORY:  This 40 year old female gravida 3, para 3, status post C-section x 3 as well as hysterectomy has been followed for left lower quadrant pain for the past month with ultrasound confirming left ovarian cyst treated with oral contraceptives with only partial relief of the pain.  The birth control pills were stopped 1 week ago.  The patient presented to the emergency room with severe  left lower quadrant pain for 12 hours not responding to p.o. Lortab plus Motrin 800s and described as worse than her hysterectomy in pain severity.  Inpatient IV analgesics versus surgical evaluation reviewed and we decided to operate.  REVIEW OF SYSTEMS:  Notable for normal bowel function with normal bowel movemenT at 4 a.m. with no change in discomfort level.  SOCIAL HISTORY:  C-section x 3, abdominal.  ALLERGIES:  No known drug allergies.  MEDICATIONS: 1. Lortab. 2. Motrin. 3. Birth control pills until 1 week ago.  PHYSICAL EXAMINATION:  VITAL SIGNS:  Height 5 feet 1 inch, weight 135 pounds with abdomen showing relaxed abdominal wall,  vertical lower abdominal scar Pfannenstiel type incision, for with initial C-section.  VAGINAL EXAM:  Normal.  Bimanual showing 3+ tenderness in the left adnexa with slight fullness.  ABDOMEN:  Left lower quadrant palpation feels a fullness in the left lower quadrant.  HOSPITAL COURSE:  The patient was taken to the operating room where diagnostic laparoscopy revealed extensive adhesions from the omentum to the anterior abdominal wall and to the left lower quadrant necessitating conversion to laparotomy.  The laparotomy revealed the aforementioned omental adhesions, no evidence of abdominal trauma, no evidence of bowel trauma related to entry but did reveal the sigmoid colon was resting over the top of the ovary adherent down to its surface with several ecchymotic and hemorrhagic appearing epiploic fat.  Dissection free was performed gradually and it became apparent that the scar tissue had incarcerated a couple of the epiploic fat tags and caused them to infarct, probably due to ischemic changes.  The bowel itself was not involved in the process.  Intraoperative consult confirmed the decision to leave the bowel alone by Dr. Scarlett Presto was performed and after assessment we decided to biopsy the omentum at the area where it had been attached to the bowel surface.  This was sent off for histology evaluation.  Additionally the appendix was removed.  We did leave the right ovary, which was mobile and without adjacent adhesions. The left tube and ovary were removed.  Postoperatively the patient did well with discharge in 2 days with stable hemoglobin 10.6, hematocrit 30.9, compared to 12.3, and 34.3, on admission.  FOLLOWUP:  Follow up will be in 1 week for staple removal and 4 weeks for postoperative check. Dictated by:   Christin Bach, M.D. Attending Physician:  Tilda Burrow DD:  03/28/01 TD:  03/28/01 Job: [id number]

## 2010-11-07 NOTE — Discharge Summary (Signed)
[facility]  Patient:    Peggy Franklin, Peggy Franklin Visit Number: [id number] MRN: [id number]          Service Type: MED Location: 4A A419 01 Attending Physician:  Tilda Burrow Dictated by:   Christin Bach, M.D. Admit Date:  08/17/2001 Discharge Date: 08/20/2001                             Discharge Summary  ADMISSION DIAGNOSIS:  Painful right ovarian cyst. History of extensive pelvic adhesions.  DISCHARGE DIAGNOSIS: 1. Right ovarian cyst. 2. Pelvic adhesions.  PROCEDURE:  Right salpingo-oophorectomy.  DISCHARGE MEDICATIONS: 1. Tylox 1-2 q.4h. p.r.n. pain. The patient has prescription. 2. Estradiol 1.5 mg dispense 30, 1 p.o. q.d., refill one year.  DISPOSITION:  Followup August 24, 2001, staple removal.  HISTORY OF PRESENT ILLNESS:  This 40 year old female foremost status post laparotomy for left ovarian cyst and infarcted epiploic fat with subsequent adhesions is admitted for a removal of her remaining tube and ovary on the right. She is status post hysterectomy. See HPI for further details.  PAST SURGICAL HISTORY:  The patient has had five surgeries through a midline lower abdominal incision, C-sections x3, abdominal hysterectomy x one, laparotomy for left salpingo-oophorectomy.  ALLERGIES:  None known.  HOSPITAL COURSE:  The patient was taken to the operating room through Day Surgery on August 17, 2001, where the right ovary was found to be adherent to the side wall. The omentum was adherent to the interior abdominal wall as was some small bowel. The bladder dome was also adherent to the omentum. There was a tendency towards diffuse oozing throughout the case, which was treated with careful surgical technique and a postoperative Jackson-Pratt drain.  POSTOPERATIVE COURSE:  Notable for generous serous drainage from the pelvic JP drain for the first 24 hours. Hematocrit dropped to 33.5 at eight hours postoperatively compared to a preoperative  hematocrit of 40%. The following day the hematocrit remains unchanged, and the JP drain was removed at 20 hours postoperatively. The patient then had an excellent uneventful postoperative course remaining afebrile, tolerating regular diet within two days, and discharged to home on postoperative day #3 in good condition. The patient was stable for discharge with staples in place with hormone replacement initiated. Followup will be next Wednesday for staple removal and then four weeks for routine postop care. Dictated by:   Christin Bach, M.D. Attending Physician:  Tilda Burrow DD:  08/20/01 TD:  08/20/01 Job: [id number]

## 2011-01-22 ENCOUNTER — Other Ambulatory Visit: Payer: Self-pay | Admitting: Family Medicine

## 2011-01-22 DIAGNOSIS — Z139 Encounter for screening, unspecified: Secondary | ICD-10-CM

## 2011-01-26 ENCOUNTER — Ambulatory Visit (HOSPITAL_COMMUNITY)
Admission: RE | Admit: 2011-01-26 | Discharge: 2011-01-26 | Disposition: A | Discharge status: Home / Self Care | Payer: BC Managed Care – PPO | Source: Ambulatory Visit | Attending: Family Medicine | Admitting: Family Medicine

## 2011-01-26 DIAGNOSIS — Z1231 Encounter for screening mammogram for malignant neoplasm of breast: Secondary | ICD-10-CM | POA: Insufficient documentation

## 2011-01-26 DIAGNOSIS — Z139 Encounter for screening, unspecified: Secondary | ICD-10-CM

## 2011-03-16 LAB — DIFFERENTIAL
Eosinophils Absolute: 0.1
Lymphs Abs: 1.2
Monocytes Relative: 4
Neutrophils Relative %: 80 — ABNORMAL HIGH

## 2011-03-16 LAB — CBC
Hemoglobin: 14.5
MCV: 89.5
RBC: 4.64
WBC: 8

## 2011-03-16 LAB — BASIC METABOLIC PANEL
Chloride: 103
Creatinine, Ser: 0.56
GFR calc Af Amer: >60
Potassium: 3.6
Sodium: 137

## 2011-03-16 LAB — CARDIAC PANEL(CRET KIN+CKTOT+MB+TROPI)
Relative Index: INVALID
Relative Index: INVALID
Total CK: 30
Total CK: 78
Total CK: 84
Troponin I: 0.01

## 2011-03-16 LAB — POCT CARDIAC MARKERS
CKMB, poc: 1.4
Troponin i, poc: <0.05

## 2011-03-16 LAB — D-DIMER, QUANTITATIVE: D-Dimer, Quant: 0.22

## 2012-05-26 ENCOUNTER — Other Ambulatory Visit: Payer: Self-pay | Admitting: Family Medicine

## 2012-05-26 DIAGNOSIS — Z139 Encounter for screening, unspecified: Secondary | ICD-10-CM

## 2012-06-06 ENCOUNTER — Ambulatory Visit (HOSPITAL_COMMUNITY): Payer: BC Managed Care – PPO

## 2012-06-16 ENCOUNTER — Ambulatory Visit (HOSPITAL_COMMUNITY)
Admission: RE | Admit: 2012-06-16 | Discharge: 2012-06-16 | Disposition: A | Discharge status: Home / Self Care | Payer: BC Managed Care – PPO | Source: Ambulatory Visit | Attending: Family Medicine | Admitting: Family Medicine

## 2012-06-16 DIAGNOSIS — Z139 Encounter for screening, unspecified: Secondary | ICD-10-CM

## 2012-06-16 DIAGNOSIS — Z1231 Encounter for screening mammogram for malignant neoplasm of breast: Secondary | ICD-10-CM | POA: Insufficient documentation

## 2012-12-05 ENCOUNTER — Telehealth: Payer: Self-pay | Admitting: Family Medicine

## 2012-12-05 MED ORDER — PANTOPRAZOLE SODIUM 40 MG PO TBEC: 40.0000 mg | DELAYED_RELEASE_TABLET | Freq: Every day | ORAL | Status: DC

## 2012-12-05 NOTE — Telephone Encounter (Signed)
Call pt, prescribe protonix 40 mg one daily #30 1 refill, may use Maalox prn, fu OV if not improving or if ongoing. If BM continue as is then OV. Avoid Pepto.

## 2012-12-05 NOTE — Telephone Encounter (Signed)
Sent in protonix 40 mg one daily #30 1 refill to [facility]. Notified patient may use Maalox prn, fu OV if ongoing. If BM continue as is then OV. Avoid Pepto. Patient verbalized understanding.

## 2012-12-05 NOTE — Telephone Encounter (Signed)
Patient needs something called in for acid reflux. She states she had a spell yesterday causing her choking, eyes watering and now she has a sore throat.  She states during this her BS also dropped because she couldn't eat. It kept her up from 3am to 12 pm. She uses [facility] in [place].   Also, she would like a nurse to call her to see if greenish-black stools is a side effect of taking Tagamet?

## 2013-01-20 ENCOUNTER — Ambulatory Visit (INDEPENDENT_AMBULATORY_CARE_PROVIDER_SITE_OTHER): Payer: BC Managed Care – PPO | Admitting: Family Medicine

## 2013-01-20 ENCOUNTER — Ambulatory Visit (HOSPITAL_COMMUNITY)
Admission: RE | Admit: 2013-01-20 | Discharge: 2013-01-20 | Disposition: A | Discharge status: Home / Self Care | Payer: BC Managed Care – PPO | Source: Ambulatory Visit | Attending: Family Medicine | Admitting: Family Medicine

## 2013-01-20 ENCOUNTER — Encounter: Payer: Self-pay | Admitting: Family Medicine

## 2013-01-20 VITALS — BP 100/68 | Wt 136.8 lb

## 2013-01-20 DIAGNOSIS — R634 Abnormal weight loss: Secondary | ICD-10-CM

## 2013-01-20 DIAGNOSIS — M545 Low back pain, unspecified: Secondary | ICD-10-CM | POA: Insufficient documentation

## 2013-01-20 DIAGNOSIS — K911 Postgastric surgery syndromes: Secondary | ICD-10-CM

## 2013-01-20 DIAGNOSIS — M25552 Pain in left hip: Secondary | ICD-10-CM

## 2013-01-20 DIAGNOSIS — M549 Dorsalgia, unspecified: Secondary | ICD-10-CM

## 2013-01-20 DIAGNOSIS — M25559 Pain in unspecified hip: Secondary | ICD-10-CM

## 2013-01-20 LAB — CBC WITH DIFFERENTIAL/PLATELET
Basophils Relative: 0 % (ref 0–1)
Eosinophils Absolute: 0.1 10*3/uL (ref 0.0–0.7)
Lymphs Abs: 1.6 10*3/uL (ref 0.7–4.0)
MCH: 30.7 pg (ref 26.0–34.0)
Neutrophils Relative %: 64 % (ref 43–77)
Platelets: 209 10*3/uL (ref 150–400)
RBC: 4.49 MIL/uL (ref 3.87–5.11)

## 2013-01-20 MED ORDER — CHOLESTYRAMINE LIGHT 4 G PO POWD: 4.0000 g | Freq: Two times a day (BID) | ORAL | Status: DC

## 2013-01-20 MED ORDER — MELOXICAM 15 MG PO TABS: 15.0000 mg | ORAL_TABLET | Freq: Every day | ORAL | Status: DC

## 2013-01-20 NOTE — Patient Instructions (Signed)
Do your xrays at the hospital  You have had labs ordered as part of today's visit. These test are necessary for [place] to provide good care. Failure to complete the labs in a timely basis can cause significant risk to your health. It is your responsibility to do the test. These orders are good for 30 days. After 30 days they are cancelled by the system and require new orders. Labs are drawn at [facility], located across from the hospital.Please do your test as soon as possible!! All results are forwarded to you via phone call for urgent findings and via "My Chart" or letter for routine findings.Letters typically take 7 to 10 days for you to receive.If you don't receive results within 2 weeks please call us for the results.  In the future,for chronic visits we encourage you to have labs ordered and completed a few days before your visit so we can have a detailed discussion about the results. Doing labs for chronic health issues ( thyroid,diabetes, cholesterol,etc.) before your visit allows for you to have a more thorough and efficient visit.  Lab orders can be given by our office to you by request.

## 2013-01-20 NOTE — Progress Notes (Signed)
  Subjective:    Patient ID: Peggy Franklin, female    DOB: 01/02/1971, 42 y.o.   MRN: [id number]  Back Pain This is a new problem. The current episode started in the past 7 days. The problem has been gradually improving since onset.      Review of Systems  Musculoskeletal: Positive for back pain.       Objective:   Physical Exam Lungs are clear hearts regular pulse normal strength in arms and legs are good subjective discomfort in the low back more so on the left side has good range of motion of the hip with subjective discomfort in the hip. Reflexes good. No atrophy noted. Abdomen soft.       Assessment & Plan:  #1-dumping syndrome try Questran one scoop twice daily as directed see if this helps followup within 4-6 weeks if not doing better  #2 significant lumbar pain with sciatica and hip pain I do recommend some x-rays I showed her some exercises she can do she can also try some anti-inflammatory if not improved over the next 3-4 weeks consider possible MRI possible physical therapy  #3 moderate weight loss I believe this is related to this surgery she has had along with the dumping syndrome and as a result she is also having some weight loss also she is eating much healthier than what she used to do. We will check a couple lab tests. She ought to followup within 3 months.

## 2013-01-21 ENCOUNTER — Encounter: Payer: Self-pay | Admitting: Family Medicine

## 2013-01-21 LAB — HEPATIC FUNCTION PANEL
Albumin: 4.8 g/dL (ref 3.5–5.2)
Alkaline Phosphatase: 71 U/L (ref 39–117)
Total Bilirubin: 0.5 mg/dL (ref 0.3–1.2)

## 2013-02-16 ENCOUNTER — Other Ambulatory Visit: Payer: Self-pay | Admitting: Family Medicine

## 2013-03-17 ENCOUNTER — Other Ambulatory Visit: Payer: Self-pay | Admitting: Family Medicine

## 2013-06-23 ENCOUNTER — Other Ambulatory Visit: Payer: Self-pay | Admitting: Family Medicine

## 2013-06-23 DIAGNOSIS — Z139 Encounter for screening, unspecified: Secondary | ICD-10-CM

## 2013-06-26 ENCOUNTER — Ambulatory Visit (HOSPITAL_COMMUNITY): Payer: BC Managed Care – PPO

## 2013-07-03 ENCOUNTER — Ambulatory Visit (INDEPENDENT_AMBULATORY_CARE_PROVIDER_SITE_OTHER): Payer: BC Managed Care – PPO | Admitting: Family Medicine

## 2013-07-03 ENCOUNTER — Encounter: Payer: Self-pay | Admitting: Family Medicine

## 2013-07-03 VITALS — BP 122/72 | Ht 61.0 in | Wt 140.2 lb

## 2013-07-03 DIAGNOSIS — S40029A Contusion of unspecified upper arm, initial encounter: Secondary | ICD-10-CM

## 2013-07-03 DIAGNOSIS — S40021A Contusion of right upper arm, initial encounter: Secondary | ICD-10-CM

## 2013-07-03 NOTE — Progress Notes (Signed)
   Subjective:    Patient ID: Peggy Franklin, female    DOB: 1971-05-11, 43 y.o.   MRN: [id number]  Arm Injury  The incident occurred 12 to 24 hours ago. The incident occurred at home. The injury mechanism was a direct blow. The pain is present in the right forearm. The quality of the pain is described as aching, burning, cramping, shooting and stabbing. The pain does not radiate. The pain has been intermittent since the incident. The symptoms are aggravated by movement. She has tried ice and heat for the symptoms. The treatment provided mild relief.   PMH she's had a previous fracture in the right arm with a plate put in   Review of Systems She relates swelling pain discomfort with good range of motion.    Objective:   Physical Exam  On physical exam the right arm has a bruise noted on some swelling. She can flex and extend her arm completely. Pronation and supination perfectly normal. She does have moderate tenderness in the proximal forearm. I do not feel that the patient has any type of fracture.      Assessment & Plan:  Contusion of the forearm, cool compresses, ibuprofen if necessary. If progressive symptoms or if loss of mobility then the next step would be is to do an x-ray. No sign of DVT.

## 2013-07-24 ENCOUNTER — Ambulatory Visit (HOSPITAL_COMMUNITY)
Admission: RE | Admit: 2013-07-24 | Discharge: 2013-07-24 | Disposition: A | Discharge status: Home / Self Care | Payer: BC Managed Care – PPO | Source: Ambulatory Visit | Attending: Family Medicine | Admitting: Family Medicine

## 2013-07-24 DIAGNOSIS — Z139 Encounter for screening, unspecified: Secondary | ICD-10-CM

## 2013-07-24 DIAGNOSIS — Z1231 Encounter for screening mammogram for malignant neoplasm of breast: Secondary | ICD-10-CM | POA: Insufficient documentation

## 2013-08-18 ENCOUNTER — Telehealth: Payer: Self-pay | Admitting: Family Medicine

## 2013-08-18 NOTE — Telephone Encounter (Signed)
Patient says that she broke her arm over 3 years ago, and she had to have reconstructive surgery. Her doctor who did this has moved out of town, and now she is having issues with her middle finger and would like to know if she needs to find another bone doctor or come in for an [place] here?

## 2013-08-21 NOTE — Telephone Encounter (Signed)
Pt said her arm is not bothering her anymore lately. She said she will call and schedule office visit with [place] when it starts to hurt again.

## 2013-08-21 NOTE — Telephone Encounter (Signed)
Please discuss with her. If she would like to be referred to a hand specialist in [place] I can do that or if she would like to come in and be seen and discuss it further we can do it as well

## 2013-09-10 ENCOUNTER — Other Ambulatory Visit: Payer: Self-pay | Admitting: Family Medicine

## 2013-10-16 ENCOUNTER — Telehealth: Payer: Self-pay | Admitting: Family Medicine

## 2013-10-16 NOTE — Telephone Encounter (Signed)
I spoke with patient to get more info. It sounds like a hemmorid, but I explained to her that it is difficult to dx her over the phone w/o looking at the "knot". I told her we would need to see her in order to dx her correctly. She will call back to make an appt after she speaks with her work.

## 2013-10-16 NOTE — Telephone Encounter (Signed)
Patient says that she has a knot on her bottom. When she uses the bathroom she is in excruciating pain. She wants to know if there is something she can do at home to treat this? She says she has to work so its hard for her to come in.

## 2013-10-30 ENCOUNTER — Telehealth: Payer: Self-pay | Admitting: Family Medicine

## 2013-10-30 NOTE — Telephone Encounter (Signed)
Patient says she had a really bad acid reflux attack on Saturday. She took her pantoprazole later than she usually does. She said she is just really sore today from the burning. Please advise on what you suggest she should do.   [facility]

## 2013-10-30 NOTE — Telephone Encounter (Signed)
Discussed with patient. Patient having bad reflux- Patient to call back to schedule office visit if continued problems. ER if worse

## 2013-10-30 NOTE — Telephone Encounter (Signed)
Bland diet continue medication may well need an office visit. The nurse can speak with her more gathering more information if possible thank you

## 2013-12-08 ENCOUNTER — Other Ambulatory Visit: Payer: Self-pay | Admitting: Family Medicine

## 2014-02-02 ENCOUNTER — Ambulatory Visit (INDEPENDENT_AMBULATORY_CARE_PROVIDER_SITE_OTHER): Payer: BC Managed Care – PPO | Admitting: Nurse Practitioner

## 2014-02-02 ENCOUNTER — Encounter: Payer: Self-pay | Admitting: Nurse Practitioner

## 2014-02-02 VITALS — BP 110/70 | Ht 61.0 in | Wt 130.0 lb

## 2014-02-02 DIAGNOSIS — R5383 Other fatigue: Secondary | ICD-10-CM

## 2014-02-02 DIAGNOSIS — Z0189 Encounter for other specified special examinations: Secondary | ICD-10-CM

## 2014-02-02 DIAGNOSIS — K911 Postgastric surgery syndromes: Secondary | ICD-10-CM

## 2014-02-02 DIAGNOSIS — M79609 Pain in unspecified limb: Secondary | ICD-10-CM

## 2014-02-02 DIAGNOSIS — R5381 Other malaise: Secondary | ICD-10-CM

## 2014-02-02 DIAGNOSIS — M79601 Pain in right arm: Secondary | ICD-10-CM

## 2014-02-02 DIAGNOSIS — Z79899 Other long term (current) drug therapy: Secondary | ICD-10-CM

## 2014-02-08 ENCOUNTER — Encounter: Payer: Self-pay | Admitting: Nurse Practitioner

## 2014-02-08 NOTE — Progress Notes (Signed)
Subjective:  Presents for c/o right lower arm pain around the site of a steel plate in her forearm from previous surgery. Has noticed for a few months. Her job requires repetitive movement of the arm. Gets slightly better when she is off work. Mild swelling in the area. Surgery was done years ago. Also needs routine labs. History of dumping syndrome related to abd/gastric surgery for a mass. Overall controlled by her diet.   Objective:   BP 110/70  Ht 5\' 1"  (1.549 m)  Wt 130 lb (58.968 kg)  BMI 24.58 kg/m2 NAD. Alert, oriented. Lungs clear. Heart RRR. Right forearm minimal edema, no erythema, tender to palpation at previous surgical site.  Assessment: Right arm pain  Postsurgical dumping syndrome - Plan: CBC with Differential  Encounter for long-term (current) use of other medications - Plan: Hepatic function panel, Basic metabolic panel  Other specified examination - Plan: Lipid panel  Other malaise and fatigue - Plan: TSH  Plan: ice/heart applications to arm. Consider some type of arm band/support. Defers referral back to specialist. Call back if persists.

## 2014-02-10 LAB — CBC WITH DIFFERENTIAL/PLATELET
BASOS ABS: 0 10*3/uL (ref 0.0–0.1)
Basophils Relative: 0 % (ref 0–1)
EOS ABS: 0.1 10*3/uL (ref 0.0–0.7)
EOS PCT: 1 % (ref 0–5)
HEMATOCRIT: 42.3 % (ref 36.0–46.0)
Hemoglobin: 14.5 g/dL (ref 12.0–15.0)
LYMPHS PCT: 19 % (ref 12–46)
Lymphs Abs: 1 10*3/uL (ref 0.7–4.0)
MCH: 30.6 pg (ref 26.0–34.0)
MCHC: 34.3 g/dL (ref 30.0–36.0)
MCV: 89.2 fL (ref 78.0–100.0)
MONO ABS: 0.3 10*3/uL (ref 0.1–1.0)
Monocytes Relative: 6 % (ref 3–12)
Neutro Abs: 4.1 10*3/uL (ref 1.7–7.7)
Neutrophils Relative %: 74 % (ref 43–77)
Platelets: 191 10*3/uL (ref 150–400)
RBC: 4.74 MIL/uL (ref 3.87–5.11)
RDW: 13.5 % (ref 11.5–15.5)
WBC: 5.5 10*3/uL (ref 4.0–10.5)

## 2014-02-10 LAB — BASIC METABOLIC PANEL
BUN: 11 mg/dL (ref 6–23)
CHLORIDE: 104 meq/L (ref 96–112)
CO2: 27 meq/L (ref 19–32)
CREATININE: 0.62 mg/dL (ref 0.50–1.10)
Calcium: 9.2 mg/dL (ref 8.4–10.5)
GLUCOSE: 90 mg/dL (ref 70–99)
POTASSIUM: 3.8 meq/L (ref 3.5–5.3)
Sodium: 139 mEq/L (ref 135–145)

## 2014-02-10 LAB — HEPATIC FUNCTION PANEL
ALK PHOS: 59 U/L (ref 39–117)
ALT: 10 U/L (ref 0–35)
AST: 15 U/L (ref 0–37)
Albumin: 4.4 g/dL (ref 3.5–5.2)
BILIRUBIN DIRECT: 0.1 mg/dL (ref 0.0–0.3)
BILIRUBIN INDIRECT: 0.5 mg/dL (ref 0.2–1.2)
BILIRUBIN TOTAL: 0.6 mg/dL (ref 0.2–1.2)
Total Protein: 6.2 g/dL (ref 6.0–8.3)

## 2014-02-10 LAB — LIPID PANEL
Cholesterol: 156 mg/dL (ref 0–200)
HDL: 40 mg/dL (ref >39)
LDL CALC: 98 mg/dL (ref 0–99)
Total CHOL/HDL Ratio: 3.9 Ratio
Triglycerides: 90 mg/dL (ref <150)
VLDL: 18 mg/dL (ref 0–40)

## 2014-02-10 LAB — TSH: TSH: 0.46 u[IU]/mL (ref 0.350–4.500)

## 2014-02-15 ENCOUNTER — Encounter: Payer: Self-pay | Admitting: Nurse Practitioner

## 2014-03-08 ENCOUNTER — Other Ambulatory Visit: Payer: Self-pay | Admitting: Family Medicine

## 2014-06-05 ENCOUNTER — Other Ambulatory Visit: Payer: Self-pay | Admitting: Nurse Practitioner

## 2014-07-26 ENCOUNTER — Other Ambulatory Visit: Payer: Self-pay | Admitting: Family Medicine

## 2014-07-26 DIAGNOSIS — Z1231 Encounter for screening mammogram for malignant neoplasm of breast: Secondary | ICD-10-CM

## 2014-07-30 ENCOUNTER — Ambulatory Visit (HOSPITAL_COMMUNITY)
Admission: RE | Admit: 2014-07-30 | Discharge: 2014-07-30 | Disposition: A | Discharge status: Home / Self Care | Payer: BLUE CROSS/BLUE SHIELD | Source: Ambulatory Visit | Attending: Family Medicine | Admitting: Family Medicine

## 2014-07-30 DIAGNOSIS — Z1231 Encounter for screening mammogram for malignant neoplasm of breast: Secondary | ICD-10-CM | POA: Diagnosis not present

## 2014-09-05 ENCOUNTER — Other Ambulatory Visit: Payer: Self-pay | Admitting: Family Medicine

## 2014-12-06 ENCOUNTER — Other Ambulatory Visit: Payer: Self-pay | Admitting: Family Medicine

## 2014-12-07 ENCOUNTER — Other Ambulatory Visit: Payer: Self-pay | Admitting: Family Medicine

## 2014-12-07 NOTE — Telephone Encounter (Signed)
Needs office visit.

## 2014-12-27 ENCOUNTER — Ambulatory Visit (HOSPITAL_COMMUNITY)
Admission: RE | Admit: 2014-12-27 | Discharge: 2014-12-27 | Disposition: A | Discharge status: Home / Self Care | Payer: BLUE CROSS/BLUE SHIELD | Source: Ambulatory Visit | Attending: Nurse Practitioner | Admitting: Nurse Practitioner

## 2014-12-27 ENCOUNTER — Encounter: Payer: Self-pay | Admitting: Nurse Practitioner

## 2014-12-27 ENCOUNTER — Ambulatory Visit (INDEPENDENT_AMBULATORY_CARE_PROVIDER_SITE_OTHER): Payer: BLUE CROSS/BLUE SHIELD | Admitting: Nurse Practitioner

## 2014-12-27 VITALS — BP 128/70 | Temp 98.6°F | Ht 61.0 in | Wt 131.0 lb

## 2014-12-27 DIAGNOSIS — J0100 Acute maxillary sinusitis, unspecified: Secondary | ICD-10-CM | POA: Diagnosis not present

## 2014-12-27 DIAGNOSIS — Z13220 Encounter for screening for lipoid disorders: Secondary | ICD-10-CM | POA: Diagnosis not present

## 2014-12-27 DIAGNOSIS — Z79899 Other long term (current) drug therapy: Secondary | ICD-10-CM | POA: Diagnosis not present

## 2014-12-27 DIAGNOSIS — M25561 Pain in right knee: Secondary | ICD-10-CM

## 2014-12-27 DIAGNOSIS — R5383 Other fatigue: Secondary | ICD-10-CM | POA: Diagnosis not present

## 2014-12-27 DIAGNOSIS — J300 Vasomotor rhinitis: Secondary | ICD-10-CM

## 2014-12-27 MED ORDER — AZITHROMYCIN 250 MG PO TABS: ORAL_TABLET | ORAL | Status: DC

## 2014-12-27 MED ORDER — FLUTICASONE PROPIONATE 50 MCG/ACT NA SUSP: 2.0000 | Freq: Every day | NASAL | Status: DC

## 2014-12-27 NOTE — Patient Instructions (Signed)
Loratadine or allegra as directed

## 2014-12-27 NOTE — Progress Notes (Signed)
Subjective:   Presents for complaints of pain off-and-on in the right knee for the past 4-6 months. Remote history of injury as a teenager. Has been wearing a neoprene knee support which helps slightly.   Pain behind the patella , will occasionally lock up. Patient is able to get the joint to move.  Also complaints of right ear pressure and popping that began back in March. No cough sore throat head congestion or sneezing. Right maxillary area sinus pressure. Puffiness underneath both eyes. Has 2 small areas on the base of the ring and small fingers on the left hand, does repetitive motion at work with this hand. Areas are nontender.  Also needs routine lab work.  Objective:   BP 128/70 mmHg  Temp(Src) 98.6 F (37 C) (Oral)  Ht 5\' 1"  (1.549 m)  Wt 131 lb (59.421 kg)  BMI 24.76 kg/m2  NAD. Alert, oriented.  TMs significantly retracted more on the right, no erythema. Pharynx mildly erythematous with green PND noted. Neck supple with mild soft anterior adenopathy.Lungs clear. Heart regular rate rhythm. Right knee significant laxity especially compared to the left. Minimal tenderness with range of motion. Gait normal limit. 2 small smooth Rounds lesions noted at the same area at the base of the ring and small fingers left hand.  Assessment: Vasomotor rhinitis  Acute maxillary sinusitis, recurrence not specified  Right knee pain - Plan: DG Knee Complete 4 Views Right  Screening cholesterol level - Plan: Lipid panel  High risk medication use - Plan: Hepatic function panel, Basic metabolic panel  Other fatigue - Plan: TSH, Vit D  25 hydroxy (rtn osteoporosis monitoring)  Plan:  Meds ordered this encounter  Medications  . azithromycin (ZITHROMAX Z-PAK) 250 MG tablet    Sig: Take 2 tablets (500 mg) on  Day 1,  followed by 1 tablet (250 mg) once daily on Days 2 through 5.    Dispense:  6 each    Refill:  0    Order Specific Question:  Supervising Provider    Answer:  Mikey Kirschner [[id number]]  .  fluticasone (FLONASE) 50 MCG/ACT nasal spray    Sig: Place 2 sprays into both nostrils daily.    Dispense:  16 g    Refill:  5    Order Specific Question:  Supervising Provider    Answer:  Mikey Kirschner [[id number]]    explained lesions are most likely a form of callus or possible small cyst. No further intervention since she is asymptomatic. OTC antihistamine as directed. Routine labs ordered.  Defers orthopedic referral as of today. X-ray ordered, recommend knee brace.  Recheck if symptoms worsen or persist.

## 2014-12-28 ENCOUNTER — Encounter: Payer: Self-pay | Admitting: Nurse Practitioner

## 2014-12-28 LAB — HEPATIC FUNCTION PANEL
ALBUMIN: 4.7 g/dL (ref 3.5–5.5)
ALK PHOS: 70 IU/L (ref 39–117)
ALT: 17 IU/L (ref 0–32)
AST: 17 IU/L (ref 0–40)
BILIRUBIN TOTAL: 0.3 mg/dL (ref 0.0–1.2)
BILIRUBIN, DIRECT: 0.12 mg/dL (ref 0.00–0.40)
TOTAL PROTEIN: 6.7 g/dL (ref 6.0–8.5)

## 2014-12-28 LAB — TSH: TSH: 1.01 u[IU]/mL (ref 0.450–4.500)

## 2014-12-28 LAB — BASIC METABOLIC PANEL
BUN / CREAT RATIO: 15 (ref 9–23)
BUN: 9 mg/dL (ref 6–24)
CALCIUM: 9.4 mg/dL (ref 8.7–10.2)
CHLORIDE: 99 mmol/L (ref 97–108)
CO2: 26 mmol/L (ref 18–29)
CREATININE: 0.61 mg/dL (ref 0.57–1.00)
GFR calc Af Amer: 127 mL/min/{1.73_m2} (ref >59)
GFR calc non Af Amer: 110 mL/min/{1.73_m2} (ref >59)
GLUCOSE: 79 mg/dL (ref 65–99)
Potassium: 4.5 mmol/L (ref 3.5–5.2)
Sodium: 140 mmol/L (ref 134–144)

## 2014-12-28 LAB — LIPID PANEL
Chol/HDL Ratio: 3.5 ratio units (ref 0.0–4.4)
Cholesterol, Total: 173 mg/dL (ref 100–199)
HDL: 49 mg/dL (ref >39)
LDL CALC: 98 mg/dL (ref 0–99)
Triglycerides: 131 mg/dL (ref 0–149)
VLDL CHOLESTEROL CAL: 26 mg/dL (ref 5–40)

## 2014-12-28 LAB — VITAMIN D 25 HYDROXY (VIT D DEFICIENCY, FRACTURES): VIT D 25 HYDROXY: 53.8 ng/mL (ref 30.0–100.0)

## 2015-01-30 ENCOUNTER — Other Ambulatory Visit: Payer: Self-pay | Admitting: Family Medicine

## 2015-03-01 ENCOUNTER — Other Ambulatory Visit: Payer: Self-pay | Admitting: Family Medicine

## 2015-03-07 ENCOUNTER — Ambulatory Visit (INDEPENDENT_AMBULATORY_CARE_PROVIDER_SITE_OTHER): Payer: BLUE CROSS/BLUE SHIELD | Admitting: Nurse Practitioner

## 2015-03-07 ENCOUNTER — Encounter: Payer: Self-pay | Admitting: Nurse Practitioner

## 2015-03-07 VITALS — BP 112/70 | Temp 98.9°F | Ht 61.0 in | Wt 130.0 lb

## 2015-03-07 DIAGNOSIS — J329 Chronic sinusitis, unspecified: Secondary | ICD-10-CM

## 2015-03-07 DIAGNOSIS — K219 Gastro-esophageal reflux disease without esophagitis: Secondary | ICD-10-CM

## 2015-03-07 DIAGNOSIS — J310 Chronic rhinitis: Secondary | ICD-10-CM

## 2015-03-07 DIAGNOSIS — Z23 Encounter for immunization: Secondary | ICD-10-CM

## 2015-03-07 MED ORDER — AZITHROMYCIN 250 MG PO TABS: ORAL_TABLET | ORAL | Status: DC

## 2015-03-07 MED ORDER — PANTOPRAZOLE SODIUM 40 MG PO TBEC: 40.0000 mg | DELAYED_RELEASE_TABLET | Freq: Every day | ORAL | Status: DC

## 2015-03-07 NOTE — Progress Notes (Signed)
Subjective:   Presents for recheck on her reflux. Symptoms are completely stable as long as she takes her Protonix daily.  Has had one flareup over the past year when she took her medication late. No abdominal pain. Also complaints of head congestion and a nasty taste in her mouth for the past week. No fever. No headache. Rare cough. Ear pressure. Has been taking Flonase and over-the-counter sinus pill with minimal relief. Is no longer using E cigarettes, now back on regular cigarettes less than one pack per day.  Objective:   BP 112/70 mmHg  Temp(Src) 98.9 F (37.2 C) (Oral)  Ht 5\' 1"  (1.549 m)  Wt 130 lb (58.968 kg)  BMI 24.58 kg/m2  NAD. Alert, oriented. TMs clear effusion, no erythema. Pharynx injected with PND noted. Neck supple with mild soft anterior adenopathy. Lungs clear. Heart regular rhythm. Abdomen soft nontender.   Assessment:  Problem List Items Addressed This Visit      Digestive   Gastroesophageal reflux disease without esophagitis   Relevant Medications   pantoprazole (PROTONIX) 40 MG tablet    Other Visit Diagnoses    Rhinosinusitis    -  Primary    Relevant Medications    azithromycin (ZITHROMAX Z-PAK) 250 MG tablet      Plan:  Meds ordered this encounter  Medications  . azithromycin (ZITHROMAX Z-PAK) 250 MG tablet    Sig: Take 2 tablets (500 mg) on  Day 1,  followed by 1 tablet (250 mg) once daily on Days 2 through 5.    Dispense:  6 each    Refill:  0    Order Specific Question:  Supervising Provider    Answer:  Mikey Kirschner [[id number]]  . pantoprazole (PROTONIX) 40 MG tablet    Sig: Take 1 tablet (40 mg total) by mouth daily.    Dispense:  30 tablet    Refill:  11    Order Specific Question:  Supervising Provider    Answer:  Mikey Kirschner [[id number]]    continue OTC meds as directed for congestion. Discussed importance of smoking cessation. Discussed potential long-term effects of PPI use. At this time  Benefits outweigh any risk. Strongly recommend  preventive health physical. Return in about 1 year (around 03/06/2016).

## 2015-03-28 ENCOUNTER — Encounter: Payer: Self-pay | Admitting: Family Medicine

## 2015-03-28 ENCOUNTER — Ambulatory Visit (INDEPENDENT_AMBULATORY_CARE_PROVIDER_SITE_OTHER): Payer: BLUE CROSS/BLUE SHIELD | Admitting: Nurse Practitioner

## 2015-03-28 ENCOUNTER — Encounter: Payer: Self-pay | Admitting: Nurse Practitioner

## 2015-03-28 VITALS — BP 128/80 | Ht 60.0 in | Wt 132.4 lb

## 2015-03-28 DIAGNOSIS — K644 Residual hemorrhoidal skin tags: Secondary | ICD-10-CM | POA: Insufficient documentation

## 2015-03-28 DIAGNOSIS — Z0000 Encounter for general adult medical examination without abnormal findings: Secondary | ICD-10-CM | POA: Diagnosis not present

## 2015-03-28 MED ORDER — PANTOPRAZOLE SODIUM 40 MG PO TBEC: 40.0000 mg | DELAYED_RELEASE_TABLET | Freq: Every day | ORAL | Status: DC

## 2015-03-28 NOTE — Progress Notes (Signed)
   Subjective:    Patient ID: Peggy Franklin, female    DOB: 1971-05-17, 44 y.o.   MRN: [id number]  HPI presents for her wellness exam. Same sexual partner. Regular vision exams. Needs a dental exam. Mammogram is up-to-date. Very active. Doing well with her diet. Complaints of 2 skin tags in the anal area that makes it difficult for her to stay clean.    Review of Systems  Constitutional: Negative for fever, activity change, appetite change and fatigue.  HENT: Negative for dental problem, ear pain, sinus pressure and sore throat.   Respiratory: Negative for cough, chest tightness, shortness of breath and wheezing.   Cardiovascular: Negative for chest pain.  Gastrointestinal: Negative for nausea, vomiting, abdominal pain, diarrhea, constipation and abdominal distention.  Genitourinary: Negative for dysuria, urgency, frequency, vaginal discharge, enuresis, difficulty urinating, genital sores and pelvic pain.  Skin:       Anal skin tags       Objective:   Physical Exam  Constitutional: She is oriented to person, place, and time. She appears well-developed. No distress.  HENT:  Right Ear: External ear normal.  Left Ear: External ear normal.  Mouth/Throat: Oropharynx is clear and moist.  Neck: Normal range of motion. Neck supple. No tracheal deviation present. No thyromegaly present.  Cardiovascular: Normal rate, regular rhythm and normal heart sounds.  Exam reveals no gallop.   No murmur heard. Pulmonary/Chest: Effort normal and breath sounds normal.  Abdominal: Soft. She exhibits no distension. There is no tenderness.  Genitourinary: Vagina normal and uterus normal. No vaginal discharge found.  External GU: no rashes or lesions. Vagina: pale and slightly dry; no discharge. Bimanual exam normal. 2 small pink skin tags noted at the anus.   Musculoskeletal: She exhibits no edema.  Lymphadenopathy:    She has no cervical adenopathy.  Neurological: She is alert and oriented to person,  place, and time.  Skin: Skin is warm and dry. No rash noted.  Psychiatric: She has a normal mood and affect. Her behavior is normal.  Vitals reviewed. Breast exam: no masses; axillae no adenopathy.       Assessment & Plan:   Problem List Items Addressed This Visit      Digestive   Anal skin tag    Other Visit Diagnoses    Routine general medical examination at a health care facility    -  Primary      Defers surgical referral at this time for removal of skin tags, to call back if referral is needed. Encourage daily vitamin D and calcium supplementation. Return in about 1 year (around 03/27/2016) for physical.

## 2015-06-26 ENCOUNTER — Telehealth: Payer: Self-pay | Admitting: Nurse Practitioner

## 2015-06-26 NOTE — Telephone Encounter (Signed)
Peggy Franklin,   Pt states that you wrote her a script for a knee brace this past  Summer. She never did go get the brace from [place],  [place] running out of time to go get it fitted. Wants to know if you will Send another script into [place] [facility] for this please. States  Her knee is giving her a great deal of pain again with this cold Weather.

## 2015-06-27 NOTE — Telephone Encounter (Signed)
Left message on voicemail notifying patient script was faxed to pharmacy.

## 2015-06-27 NOTE — Telephone Encounter (Signed)
Written. Will leave at nurses desk.

## 2015-08-13 ENCOUNTER — Telehealth: Payer: Self-pay | Admitting: Family Medicine

## 2015-08-13 NOTE — Telephone Encounter (Signed)
Either way. It patient would like to be seen first we'll be happy to do so she would rather have a referral we can do this but some insurance companies require that we see the patient in ordered to send over proper referral. Please check with [place].

## 2015-08-13 NOTE — Telephone Encounter (Signed)
Pt LMOVM asking if she NTBS here or can we just refer  Pt states she was diagnosed with a stress fracture in 2008 or 2009 and is currently having a flare up of pain (pt did not specify where the stress fracture is)  Wants to know if she has to be seen here or can we refer to Dr. Aline Brochure  Please advise

## 2015-08-14 NOTE — Telephone Encounter (Signed)
LMRC

## 2015-08-20 NOTE — Telephone Encounter (Signed)
Patient stated she will call back next week to schedule an appt with doctor to discuss referral when she gets her work schedule

## 2015-08-30 ENCOUNTER — Other Ambulatory Visit: Payer: Self-pay | Admitting: Family Medicine

## 2015-08-30 DIAGNOSIS — Z1231 Encounter for screening mammogram for malignant neoplasm of breast: Secondary | ICD-10-CM

## 2015-09-09 ENCOUNTER — Ambulatory Visit (HOSPITAL_COMMUNITY)
Admission: RE | Admit: 2015-09-09 | Discharge: 2015-09-09 | Disposition: A | Discharge status: Home / Self Care | Payer: BLUE CROSS/BLUE SHIELD | Source: Ambulatory Visit | Attending: Family Medicine | Admitting: Family Medicine

## 2015-09-09 DIAGNOSIS — Z1231 Encounter for screening mammogram for malignant neoplasm of breast: Secondary | ICD-10-CM | POA: Diagnosis present

## 2016-02-04 ENCOUNTER — Telehealth: Payer: Self-pay | Admitting: Family Medicine

## 2016-02-04 DIAGNOSIS — R5383 Other fatigue: Secondary | ICD-10-CM

## 2016-02-04 DIAGNOSIS — Z79899 Other long term (current) drug therapy: Secondary | ICD-10-CM

## 2016-02-04 DIAGNOSIS — Z13220 Encounter for screening for lipoid disorders: Secondary | ICD-10-CM

## 2016-02-04 NOTE — Telephone Encounter (Signed)
bw orders for work wellness  Standard labs please  Last labs 12/27/14 Lip, Hep, BMP, TSH, Vit D

## 2016-02-05 NOTE — Telephone Encounter (Signed)
Lipid, metabolic 7, CBC

## 2016-02-06 NOTE — Telephone Encounter (Signed)
Bloodwork ordered. Left message on voicemail notifying patient.

## 2016-03-07 DIAGNOSIS — Z79899 Other long term (current) drug therapy: Secondary | ICD-10-CM | POA: Diagnosis not present

## 2016-03-07 DIAGNOSIS — R5383 Other fatigue: Secondary | ICD-10-CM | POA: Diagnosis not present

## 2016-03-07 DIAGNOSIS — Z13220 Encounter for screening for lipoid disorders: Secondary | ICD-10-CM | POA: Diagnosis not present

## 2016-03-08 ENCOUNTER — Encounter: Payer: Self-pay | Admitting: Family Medicine

## 2016-03-08 LAB — LIPID PANEL
CHOLESTEROL TOTAL: 179 mg/dL (ref 100–199)
Chol/HDL Ratio: 3.6 ratio units (ref 0.0–4.4)
HDL: 50 mg/dL (ref >39)
LDL CALC: 110 mg/dL — AB (ref 0–99)
TRIGLYCERIDES: 97 mg/dL (ref 0–149)
VLDL CHOLESTEROL CAL: 19 mg/dL (ref 5–40)

## 2016-03-08 LAB — CBC WITH DIFFERENTIAL/PLATELET
BASOS: 0 %
Basophils Absolute: 0 10*3/uL (ref 0.0–0.2)
EOS (ABSOLUTE): 0.1 10*3/uL (ref 0.0–0.4)
Eos: 1 %
HEMOGLOBIN: 13.9 g/dL (ref 11.1–15.9)
Hematocrit: 40.7 % (ref 34.0–46.6)
IMMATURE GRANS (ABS): 0 10*3/uL (ref 0.0–0.1)
Immature Granulocytes: 0 %
LYMPHS ABS: 1.3 10*3/uL (ref 0.7–3.1)
LYMPHS: 15 %
MCH: 32 pg (ref 26.6–33.0)
MCHC: 34.2 g/dL (ref 31.5–35.7)
MCV: 94 fL (ref 79–97)
Monocytes Absolute: 0.4 10*3/uL (ref 0.1–0.9)
Monocytes: 4 %
NEUTROS ABS: 6.9 10*3/uL (ref 1.4–7.0)
Neutrophils: 80 %
PLATELETS: 219 10*3/uL (ref 150–379)
RBC: 4.35 x10E6/uL (ref 3.77–5.28)
RDW: 13.1 % (ref 12.3–15.4)
WBC: 8.6 10*3/uL (ref 3.4–10.8)

## 2016-03-08 LAB — BASIC METABOLIC PANEL
BUN / CREAT RATIO: 20 (ref 9–23)
BUN: 10 mg/dL (ref 6–24)
CALCIUM: 9.3 mg/dL (ref 8.7–10.2)
CO2: 24 mmol/L (ref 18–29)
Chloride: 104 mmol/L (ref 96–106)
Creatinine, Ser: 0.51 mg/dL — ABNORMAL LOW (ref 0.57–1.00)
GFR, EST AFRICAN AMERICAN: 133 mL/min/{1.73_m2} (ref >59)
GFR, EST NON AFRICAN AMERICAN: 116 mL/min/{1.73_m2} (ref >59)
Glucose: 98 mg/dL (ref 65–99)
POTASSIUM: 4.1 mmol/L (ref 3.5–5.2)
Sodium: 144 mmol/L (ref 134–144)

## 2016-03-30 ENCOUNTER — Other Ambulatory Visit: Payer: Self-pay | Admitting: Nurse Practitioner

## 2016-03-30 ENCOUNTER — Other Ambulatory Visit: Payer: Self-pay | Admitting: Family Medicine

## 2016-04-24 ENCOUNTER — Other Ambulatory Visit: Payer: Self-pay | Admitting: Family Medicine

## 2016-05-01 ENCOUNTER — Telehealth: Payer: Self-pay | Admitting: Family Medicine

## 2016-05-01 MED ORDER — PANTOPRAZOLE SODIUM 40 MG PO TBEC: DELAYED_RELEASE_TABLET | ORAL | 0 refills | Status: DC

## 2016-05-01 NOTE — Telephone Encounter (Signed)
30 day supply sent in. Told patient that she has to absolutely have an office visit before any further. Patient verbalized understanding.

## 2016-05-01 NOTE — Telephone Encounter (Signed)
Pt is not able to come in for OV due to recently being put on 12 hour shifts (11:00pm-11:00am)  This will be her hours until at least after Thanksgiving and she works Sunday night through Friday mornings She can take a half vacation day to get in here but that can't happen until at least the Monday after Thanksgiving    Can we refill until pantoprazole (PROTONIX) 40 MG tablet  she can get in here to be seen?  Just got a 15 day supply   Please advise OK to leave message on voicemail     [facility]

## 2016-05-21 ENCOUNTER — Other Ambulatory Visit: Payer: Self-pay | Admitting: Family Medicine

## 2016-05-21 MED ORDER — PANTOPRAZOLE SODIUM 40 MG PO TBEC: DELAYED_RELEASE_TABLET | ORAL | 1 refills | Status: DC

## 2016-05-21 NOTE — Telephone Encounter (Signed)
Spoke with patient and informed her per [place] on Protonix were sent into pharmacy. Patient verbalized understanding.

## 2016-05-21 NOTE — Telephone Encounter (Signed)
Patient requesting another refill for protonix 40 mg until appointment on 12/14. She states has one pill left was only prescribe 15 pills. [facility]

## 2016-05-21 NOTE — Telephone Encounter (Signed)
May have this plus one additional refill 

## 2016-06-04 ENCOUNTER — Ambulatory Visit (INDEPENDENT_AMBULATORY_CARE_PROVIDER_SITE_OTHER): Payer: BLUE CROSS/BLUE SHIELD | Admitting: Nurse Practitioner

## 2016-06-04 ENCOUNTER — Telehealth: Payer: Self-pay | Admitting: Pediatrics

## 2016-06-04 VITALS — BP 126/76 | Ht 60.0 in | Wt 132.0 lb

## 2016-06-04 DIAGNOSIS — R1013 Epigastric pain: Secondary | ICD-10-CM

## 2016-06-04 NOTE — Telephone Encounter (Signed)
Per insurance co. No PA needed for abdominal US.

## 2016-06-04 NOTE — Telephone Encounter (Signed)
-----   Message from Dorothyann Gibbs, LPN sent at [phone]  2:32 PM EST ----- Regarding: PA for [place] PA needed for abdominal US. Sch for 06/23/16

## 2016-06-05 ENCOUNTER — Encounter: Payer: Self-pay | Admitting: Nurse Practitioner

## 2016-06-05 NOTE — Progress Notes (Signed)
Subjective:  Presents for c/o abdominal pain occurring 3-4 times per month, lasting about 15 min to 4 hours per episode. Usually occurs within 30 minutes of eating. Unassociated with any particular foods. Describes as similar to labor cramps. Better with stretching her torso. Rare pain in the lower sternum. Taking Protonix daily. 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 Has had surgery in the epigastric area where discomfort occurs. Also has a job requiring lifting and pulling. Dry heaves at times. No constipation or diarrhea. Stools normal color. Drinks a large amount of caffeine. Smokes 1/2 ppd. No alcohol or excessive NSAID use.   Objective:   BP 126/76   Ht 5' (1.524 m)   Wt 132 lb (59.9 kg)   BMI 25.78 kg/m  NAD. Alert, oriented. Lungs clear. Heart RRR. Abdomen soft, non distended with active BS. Linear scar noted mid epigastric area; moderate tenderness to palpation. No obvious masses.   Assessment: Epigastric pain - Plan: US Abdomen Limited, Lipase, Lipid Profile, Lipase, Lipid Profile  Plan: continue Protonix. [place] and labs pending. May need referral to GI specialist. To call back or go to ED in the meantime if symptoms worsen.

## 2016-06-23 ENCOUNTER — Ambulatory Visit (HOSPITAL_COMMUNITY)
Admission: RE | Admit: 2016-06-23 | Discharge: 2016-06-23 | Disposition: A | Discharge status: Home / Self Care | Payer: BLUE CROSS/BLUE SHIELD | Source: Ambulatory Visit | Attending: Nurse Practitioner | Admitting: Nurse Practitioner

## 2016-06-23 DIAGNOSIS — R1084 Generalized abdominal pain: Secondary | ICD-10-CM | POA: Diagnosis not present

## 2016-06-23 DIAGNOSIS — K838 Other specified diseases of biliary tract: Secondary | ICD-10-CM | POA: Diagnosis not present

## 2016-06-23 DIAGNOSIS — R1013 Epigastric pain: Secondary | ICD-10-CM | POA: Insufficient documentation

## 2016-06-29 ENCOUNTER — Other Ambulatory Visit: Payer: Self-pay | Admitting: Nurse Practitioner

## 2016-06-29 DIAGNOSIS — R1030 Lower abdominal pain, unspecified: Secondary | ICD-10-CM | POA: Diagnosis not present

## 2016-06-29 LAB — LIPID PANEL
CHOL/HDL RATIO: 2.8 ratio (ref <5.0)
CHOLESTEROL: 154 mg/dL (ref <200)
HDL: 55 mg/dL (ref >50)
LDL CALC: 74 mg/dL (ref <100)
Triglycerides: 125 mg/dL (ref <150)
VLDL: 25 mg/dL (ref <30)

## 2016-06-29 LAB — LIPASE: Lipase: 24 U/L (ref 7–60)

## 2016-07-13 ENCOUNTER — Encounter: Payer: Self-pay | Admitting: Nurse Practitioner

## 2016-07-16 ENCOUNTER — Ambulatory Visit (INDEPENDENT_AMBULATORY_CARE_PROVIDER_SITE_OTHER): Payer: BLUE CROSS/BLUE SHIELD | Admitting: Otolaryngology

## 2016-07-16 DIAGNOSIS — J342 Deviated nasal septum: Secondary | ICD-10-CM | POA: Diagnosis not present

## 2016-07-16 DIAGNOSIS — J343 Hypertrophy of nasal turbinates: Secondary | ICD-10-CM

## 2016-08-10 ENCOUNTER — Other Ambulatory Visit: Payer: Self-pay | Admitting: Family Medicine

## 2016-08-10 DIAGNOSIS — Z1231 Encounter for screening mammogram for malignant neoplasm of breast: Secondary | ICD-10-CM

## 2016-08-12 DIAGNOSIS — B079 Viral wart, unspecified: Secondary | ICD-10-CM | POA: Diagnosis not present

## 2016-08-12 DIAGNOSIS — B078 Other viral warts: Secondary | ICD-10-CM | POA: Diagnosis not present

## 2016-08-13 ENCOUNTER — Ambulatory Visit (INDEPENDENT_AMBULATORY_CARE_PROVIDER_SITE_OTHER): Payer: BLUE CROSS/BLUE SHIELD | Admitting: Otolaryngology

## 2016-08-13 DIAGNOSIS — J310 Chronic rhinitis: Secondary | ICD-10-CM

## 2016-08-13 DIAGNOSIS — J342 Deviated nasal septum: Secondary | ICD-10-CM | POA: Diagnosis not present

## 2016-08-13 DIAGNOSIS — J343 Hypertrophy of nasal turbinates: Secondary | ICD-10-CM

## 2016-08-25 ENCOUNTER — Other Ambulatory Visit: Payer: Self-pay | Admitting: Family Medicine

## 2016-08-26 ENCOUNTER — Other Ambulatory Visit: Payer: Self-pay | Admitting: *Deleted

## 2016-08-26 MED ORDER — PANTOPRAZOLE SODIUM 40 MG PO TBEC: DELAYED_RELEASE_TABLET | ORAL | 5 refills | Status: DC

## 2016-09-10 ENCOUNTER — Ambulatory Visit (HOSPITAL_COMMUNITY)
Admission: RE | Admit: 2016-09-10 | Discharge: 2016-09-10 | Disposition: A | Discharge status: Home / Self Care | Payer: BLUE CROSS/BLUE SHIELD | Source: Ambulatory Visit | Attending: Family Medicine | Admitting: Family Medicine

## 2016-09-10 DIAGNOSIS — Z1231 Encounter for screening mammogram for malignant neoplasm of breast: Secondary | ICD-10-CM | POA: Insufficient documentation

## 2016-10-16 ENCOUNTER — Ambulatory Visit (INDEPENDENT_AMBULATORY_CARE_PROVIDER_SITE_OTHER): Payer: BLUE CROSS/BLUE SHIELD | Admitting: Nurse Practitioner

## 2016-10-16 ENCOUNTER — Encounter: Payer: Self-pay | Admitting: Nurse Practitioner

## 2016-10-16 VITALS — BP 122/82 | Temp 98.1°F | Ht 60.0 in | Wt 130.0 lb

## 2016-10-16 DIAGNOSIS — H00014 Hordeolum externum left upper eyelid: Secondary | ICD-10-CM | POA: Diagnosis not present

## 2016-10-16 DIAGNOSIS — R5383 Other fatigue: Secondary | ICD-10-CM

## 2016-10-16 MED ORDER — CEPHALEXIN 500 MG PO CAPS: 500.0000 mg | ORAL_CAPSULE | Freq: Three times a day (TID) | ORAL | 0 refills | Status: DC

## 2016-10-16 MED ORDER — SULFACETAMIDE SODIUM 10 % OP SOLN: 1.0000 [drp] | OPHTHALMIC | 0 refills | Status: DC

## 2016-10-16 NOTE — Progress Notes (Signed)
Subjective:  Presents for c/o pain and swelling in the left upper eyelid that began 3 days ago. No history of injury. Now having throbbing pain with scratchy sensation. Itching and burning. No actual eye pain or visual changes. Also has yellowish discoloration along dry areas on hands probably due to her work.   Objective:   BP 122/82   Temp 98.1 F (36.7 C) (Oral)   Ht 5' (1.524 m)   Wt 130 lb (59 kg)   BMI 25.39 kg/m  NAD. Alert, oriented. Conjunctivae mildly injected bilat. Area of edema and erythema at the inner upper left eyelid. Tender to palpation. No tenderness around the eye. Tetracaine drop applied to left eye. Fluorescin stain applied. Small superficial corneal abrasion noted approximately 6 o'clock. No foreign body noted. No jaundice noted in the sclera. Dry eczematous along fingers with slight yellowish staining.   Assessment:  Hordeolum externum of left upper eyelid  Other fatigue - Plan: Hepatic function panel    Plan:   Meds ordered this encounter  Medications  . sulfacetamide (BLEPH-10) 10 % ophthalmic solution    Sig: Place 1 drop into the left eye every 2 (two) hours while awake. Then QID starting tomorrow    Dispense:  10 mL    Refill:  0    Order Specific Question:   Supervising Provider    Answer:   Mikey Kirschner [[id number]]  . cephALEXin (KEFLEX) 500 MG capsule    Sig: Take 1 capsule (500 mg total) by mouth 3 (three) times daily.    Dispense:  21 capsule    Refill:  0    Order Specific Question:   Supervising Provider    Answer:   Mikey Kirschner [[id number]]   Warm compresses to eye. Warning signs reviewed. Call back in 72 hours if no improvement, go to ED over the weekend if worse. Liver profile to assess enzymes.  Return if symptoms worsen or fail to improve.

## 2016-10-16 NOTE — Patient Instructions (Signed)
Stye A stye is a bump on your eyelid caused by a bacterial infection. A stye can form inside the eyelid (internal stye) or outside the eyelid (external stye). An internal stye may be caused by an infected oil-producing gland inside your eyelid. An external stye may be caused by an infection at the base of your eyelash (hair follicle). Styes are very common. Anyone can get them at any age. They usually occur in just one eye, but you may have more than one in either eye. What are the causes? The infection is almost always caused by bacteria called Staphylococcus aureus. This is a common type of bacteria that lives on your skin. What increases the risk? You may be at higher risk for a stye if you have had one before. You may also be at higher risk if you have:  Diabetes.  Long-term illness.  Long-term eye redness.  A skin condition called seborrhea.  High fat levels in your blood (lipids). What are the signs or symptoms? Eyelid pain is the most common symptom of a stye. Internal styes are more painful than external styes. Other signs and symptoms may include:  Painful swelling of your eyelid.  A scratchy feeling in your eye.  Tearing and redness of your eye.  Pus draining from the stye. How is this diagnosed? Your health care provider may be able to diagnose a stye just by examining your eye. The health care provider may also check to make sure:  You do not have a fever or other signs of a more serious infection.  The infection has not spread to other parts of your eye or areas around your eye. How is this treated? Most styes will clear up in a few days without treatment. In some cases, you may need to use antibiotic drops or ointment to prevent infection. Your health care provider may have to drain the stye surgically if your stye is:  Large.  Causing a lot of pain.  Interfering with your vision. This can be done using a thin blade or a needle. Follow these instructions at  home:  Take medicines only as directed by your health care provider.  Apply a clean, warm compress to your eye for 10 minutes, 4 times a day.  Do not wear contact lenses or eye makeup until your stye has healed.  Do not try to pop or drain the stye. Contact a health care provider if:  You have chills or a fever.  Your stye does not go away after several days.  Your stye affects your vision.  Your eyeball becomes swollen, red, or painful. This information is not intended to replace advice given to you by your health care provider. Make sure you discuss any questions you have with your health care provider. Document Released: 03/18/2005 Document Revised: 02/02/2016 Document Reviewed: 09/22/2013 Elsevier Interactive Patient Education  2017 [employer].

## 2016-10-17 LAB — HEPATIC FUNCTION PANEL
ALK PHOS: 63 IU/L (ref 39–117)
ALT: 10 IU/L (ref 0–32)
AST: 15 IU/L (ref 0–40)
Albumin: 4.5 g/dL (ref 3.5–5.5)
Bilirubin Total: 0.3 mg/dL (ref 0.0–1.2)
Bilirubin, Direct: 0.12 mg/dL (ref 0.00–0.40)
TOTAL PROTEIN: 6.6 g/dL (ref 6.0–8.5)

## 2016-10-20 ENCOUNTER — Telehealth: Payer: Self-pay | Admitting: Family Medicine

## 2016-10-20 ENCOUNTER — Other Ambulatory Visit: Payer: Self-pay | Admitting: Nurse Practitioner

## 2016-10-20 MED ORDER — FLUCONAZOLE 150 MG PO TABS: ORAL_TABLET | ORAL | 0 refills | Status: DC

## 2016-10-20 NOTE — Telephone Encounter (Signed)
Already done by Dr. Nicki Reaper

## 2016-10-20 NOTE — Telephone Encounter (Signed)
Message for Peggy Franklin was seen on 4/27 with eye infection and was given antibiotic which now she has a yeast infection and wanting something called into [place].

## 2016-10-20 NOTE — Telephone Encounter (Signed)
Left message return 10/20/2016 (diflucan sent into pharmacy)

## 2016-12-17 ENCOUNTER — Encounter: Payer: Self-pay | Admitting: Family Medicine

## 2016-12-17 ENCOUNTER — Ambulatory Visit (INDEPENDENT_AMBULATORY_CARE_PROVIDER_SITE_OTHER): Payer: BLUE CROSS/BLUE SHIELD | Admitting: Family Medicine

## 2016-12-17 VITALS — BP 112/72 | Temp 98.4°F | Ht 60.0 in | Wt 128.0 lb

## 2016-12-17 DIAGNOSIS — L0291 Cutaneous abscess, unspecified: Secondary | ICD-10-CM | POA: Diagnosis not present

## 2016-12-17 MED ORDER — DOXYCYCLINE HYCLATE 100 MG PO TABS: 100.0000 mg | ORAL_TABLET | Freq: Two times a day (BID) | ORAL | 0 refills | Status: DC

## 2016-12-17 NOTE — Progress Notes (Signed)
   Subjective:    Patient ID: Peggy Franklin, female    DOB: [birth year], 46 y.o.   MRN: [id number]  HPICyst on left hip. Came up a couple of weeks ago.  She states she has a frequent history of this it occurs multiple different places causing redness tenderness denies fever chills sweats PMH benign She relates it seems to be getting larger.  Review of Systems Please see above    Objective:   Physical Exam Patient has an abscess approximately 1 cm in diameter on the left lower flank near the hip the patient has tenderness and pain and redness some fluctuation  Verbal consent was obtained patient is aware that draining it would lead to quicker resolution, 1% lidocaine with epinephrine approximately half milliliter was injected, cleansed with Betadine, #11 surgical blade use small incision made a proximally 4 mm in length 3 mm in depth, pus was drained, small packing placed, bandage applied, no complications       Assessment & Plan:  Abscess-drained, doxycycline twice a day 7 days, if progressive troubles or if worse follow-up

## 2016-12-20 LAB — WOUND CULTURE: ORGANISM ID, BACTERIA: NONE SEEN

## 2017-02-19 ENCOUNTER — Other Ambulatory Visit: Payer: Self-pay | Admitting: Nurse Practitioner

## 2017-02-19 ENCOUNTER — Encounter: Payer: Self-pay | Admitting: Nurse Practitioner

## 2017-02-19 ENCOUNTER — Ambulatory Visit (INDEPENDENT_AMBULATORY_CARE_PROVIDER_SITE_OTHER): Payer: BLUE CROSS/BLUE SHIELD | Admitting: Nurse Practitioner

## 2017-02-19 VITALS — BP 122/70 | Ht 60.0 in | Wt 129.0 lb

## 2017-02-19 DIAGNOSIS — M545 Low back pain, unspecified: Secondary | ICD-10-CM

## 2017-02-19 LAB — POCT URINALYSIS DIPSTICK: pH, UA: 5 (ref 5.0–8.0)

## 2017-02-19 MED ORDER — CYCLOBENZAPRINE HCL 10 MG PO TABS: 10.0000 mg | ORAL_TABLET | Freq: Three times a day (TID) | ORAL | 0 refills | Status: DC | PRN

## 2017-02-19 MED ORDER — NABUMETONE 750 MG PO TABS: 750.0000 mg | ORAL_TABLET | Freq: Two times a day (BID) | ORAL | 0 refills | Status: DC | PRN

## 2017-02-19 NOTE — Patient Instructions (Addendum)
TENS unit Biofreeze Lidocaine patches Conan Bowens; [facility] Chiropractor    Back Exercises The following exercises strengthen the muscles that help to support the back. They also help to keep the lower back flexible. Doing these exercises can help to prevent back pain or lessen existing pain. If you have back pain or discomfort, try doing these exercises 2-3 times each day or as told by your health care provider. When the pain goes away, do them once each day, but increase the number of times that you repeat the steps for each exercise (do more repetitions). If you do not have back pain or discomfort, do these exercises once each day or as told by your health care provider. Exercises Single Knee to Chest  Repeat these steps 3-5 times for each leg: 1. Lie on your back on a firm bed or the floor with your legs extended. 2. Bring one knee to your chest. Your other leg should stay extended and in contact with the floor. 3. Hold your knee in place by grabbing your knee or thigh. 4. Pull on your knee until you feel a gentle stretch in your lower back. 5. Hold the stretch for 10-30 seconds. 6. Slowly release and straighten your leg.  Pelvic Tilt  Repeat these steps 5-10 times: 1. Lie on your back on a firm bed or the floor with your legs extended. 2. Bend your knees so they are pointing toward the ceiling and your feet are flat on the floor. 3. Tighten your lower abdominal muscles to press your lower back against the floor. This motion will tilt your pelvis so your tailbone points up toward the ceiling instead of pointing to your feet or the floor. 4. With gentle tension and even breathing, hold this position for 5-10 seconds.  Cat-Cow  Repeat these steps until your lower back becomes more flexible: 1. Get into a hands-and-knees position on a firm surface. Keep your hands under your shoulders, and keep your knees under your hips. You may place padding under your knees for comfort. 2. Let  your head hang down, and point your tailbone toward the floor so your lower back becomes rounded like the back of a cat. 3. Hold this position for 5 seconds. 4. Slowly lift your head and point your tailbone up toward the ceiling so your back forms a sagging arch like the back of a cow. 5. Hold this position for 5 seconds.  Press-Ups  Repeat these steps 5-10 times: 1. Lie on your abdomen (face-down) on the floor. 2. Place your palms near your head, about shoulder-width apart. 3. While you keep your back as relaxed as possible and keep your hips on the floor, slowly straighten your arms to raise the top half of your body and lift your shoulders. Do not use your back muscles to raise your upper torso. You may adjust the placement of your hands to make yourself more comfortable. 4. Hold this position for 5 seconds while you keep your back relaxed. 5. Slowly return to lying flat on the floor.  Bridges  Repeat these steps 10 times: 1. Lie on your back on a firm surface. 2. Bend your knees so they are pointing toward the ceiling and your feet are flat on the floor. 3. Tighten your buttocks muscles and lift your buttocks off of the floor until your waist is at almost the same height as your knees. You should feel the muscles working in your buttocks and the back of your thighs. If you  do not feel these muscles, slide your feet 1-2 inches farther away from your buttocks. 4. Hold this position for 3-5 seconds. 5. Slowly lower your hips to the starting position, and allow your buttocks muscles to relax completely.  If this exercise is too easy, try doing it with your arms crossed over your chest. Abdominal Crunches  Repeat these steps 5-10 times: 1. Lie on your back on a firm bed or the floor with your legs extended. 2. Bend your knees so they are pointing toward the ceiling and your feet are flat on the floor. 3. Cross your arms over your chest. 4. Tip your chin slightly toward your chest without  bending your neck. 5. Tighten your abdominal muscles and slowly raise your trunk (torso) high enough to lift your shoulder blades a tiny bit off of the floor. Avoid raising your torso higher than that, because it can put too much stress on your low back and it does not help to strengthen your abdominal muscles. 6. Slowly return to your starting position.  Back Lifts Repeat these steps 5-10 times: 1. Lie on your abdomen (face-down) with your arms at your sides, and rest your forehead on the floor. 2. Tighten the muscles in your legs and your buttocks. 3. Slowly lift your chest off of the floor while you keep your hips pressed to the floor. Keep the back of your head in line with the curve in your back. Your eyes should be looking at the floor. 4. Hold this position for 3-5 seconds. 5. Slowly return to your starting position.  Contact a health care provider if:  Your back pain or discomfort gets much worse when you do an exercise.  Your back pain or discomfort does not lessen within 2 hours after you exercise. If you have any of these problems, stop doing these exercises right away. Do not do them again unless your health care provider says that you can. Get help right away if:  You develop sudden, severe back pain. If this happens, stop doing the exercises right away. Do not do them again unless your health care provider says that you can. This information is not intended to replace advice given to you by your health care provider. Make sure you discuss any questions you have with your health care provider. Document Released: 07/16/2004 Document Revised: 10/16/2015 Document Reviewed: 08/02/2014 Elsevier Interactive Patient Education  2017 [employer].

## 2017-02-19 NOTE — Telephone Encounter (Signed)
Last seen 12/17/16 

## 2017-02-20 ENCOUNTER — Encounter: Payer: Self-pay | Admitting: Nurse Practitioner

## 2017-02-20 NOTE — Progress Notes (Signed)
Subjective:  Presents for c/o left sided low back pain that began about 5-6 days ago. No history of injury. Worse with standing, sitting and prolonged walking. No dysuria, frequency, urgency or incontinence. No flank pain. Localized pain in lower thoracic/upper lumbar area left side.   Objective:   BP 122/70   Ht 5' (1.524 m)   Wt 129 lb (58.5 kg)   BMI 25.19 kg/m  NAD. Alert, oriented. Lungs clear. Heart RRR. No spinal tenderness or discomfort right low back to palpation. Distinct tenderness lower thoracic/ upper lumbar area left side. SLR pos on right and neg on left. Patellar reflexes normal. Gait slow but steady.  Results for orders placed or performed in visit on 02/19/17  POCT urinalysis dipstick  Result Value Ref Range   Color, UA     Clarity, UA     Glucose, UA     Bilirubin, UA     Ketones, UA     Spec Grav, UA <=1.005 (A) 1.010 - 1.025   Blood, UA     pH, UA 5.0 5.0 - 8.0   Protein, UA     Urobilinogen, UA  0.2 or 1.0 E.U./dL   Nitrite, UA     Leukocytes, UA Trace (A) Negative   Urine micro neg.   Assessment:  Acute left-sided low back pain without sciatica - Plan: POCT urinalysis dipstick, CANCELED: POCT urinalysis dipstick    Plan:   Meds ordered this encounter  Medications  . DISCONTD: nabumetone (RELAFEN) 750 MG tablet    Sig: Take 1 tablet (750 mg total) by mouth 2 (two) times daily as needed.    Dispense:  30 tablet    Refill:  0    Order Specific Question:   Supervising Provider    Answer:   Mikey Kirschner [[id number]]  . cyclobenzaprine (FLEXERIL) 10 MG tablet    Sig: Take 1 tablet (10 mg total) by mouth 3 (three) times daily as needed for muscle spasms.    Dispense:  30 tablet    Refill:  0    Order Specific Question:   Supervising Provider    Answer:   Mikey Kirschner [[id number]]   Take Nabumetone with food. DC if any reflux.  TENS unit Biofreeze Lidocaine patches Massage therapy Ice/heat applications Stretching and back exercises Call back in 10-14  days if no improvement, sooner if worse.

## 2017-03-17 ENCOUNTER — Other Ambulatory Visit: Payer: Self-pay | Admitting: Family Medicine

## 2017-03-19 ENCOUNTER — Other Ambulatory Visit: Payer: Self-pay | Admitting: Nurse Practitioner

## 2017-06-30 ENCOUNTER — Encounter: Payer: Self-pay | Admitting: Nurse Practitioner

## 2017-06-30 ENCOUNTER — Ambulatory Visit (INDEPENDENT_AMBULATORY_CARE_PROVIDER_SITE_OTHER): Payer: BLUE CROSS/BLUE SHIELD | Admitting: Nurse Practitioner

## 2017-06-30 VITALS — BP 110/72 | Temp 99.0°F | Ht 60.0 in | Wt 126.0 lb

## 2017-06-30 DIAGNOSIS — J069 Acute upper respiratory infection, unspecified: Secondary | ICD-10-CM | POA: Diagnosis not present

## 2017-06-30 DIAGNOSIS — B9689 Other specified bacterial agents as the cause of diseases classified elsewhere: Secondary | ICD-10-CM | POA: Diagnosis not present

## 2017-06-30 MED ORDER — AZITHROMYCIN 250 MG PO TABS: ORAL_TABLET | ORAL | 0 refills | Status: DC

## 2017-07-01 ENCOUNTER — Encounter: Payer: Self-pay | Admitting: Nurse Practitioner

## 2017-07-01 NOTE — Progress Notes (Signed)
Subjective:  Presents for c/o cough and congestion x 3 d. No fever. Sore throat. Headache mainly with cough. Head congestion. Frequent non productive cough. No wheezing. Right ear pressure.   Objective:   BP 110/72   Temp 99 F (37.2 C) (Oral)   Ht 5' (1.524 m)   Wt 126 lb (57.2 kg)   BMI 24.61 kg/m  NAD. Alert, oriented. TMs retracted, no erythema. Pharynx injected with PND noted. Neck supple with mild anterior adenopathy. Lungs clear. Occasional non productive cough. Heart RRR.   Assessment:  Bacterial upper respiratory infection    Plan:   Meds ordered this encounter  Medications  . azithromycin (ZITHROMAX Z-PAK) 250 MG tablet    Sig: Take 2 tablets (500 mg) on  Day 1,  followed by 1 tablet (250 mg) once daily on Days 2 through 5.    Dispense:  6 each    Refill:  0    Order Specific Question:   Supervising Provider    Answer:   Mikey Kirschner [[id number]]   OTC meds as directed. Call back in 5-7 d if no improvement, sooner if worse.

## 2017-07-31 ENCOUNTER — Other Ambulatory Visit: Payer: Self-pay | Admitting: Nurse Practitioner

## 2017-08-06 ENCOUNTER — Telehealth: Payer: Self-pay | Admitting: Family Medicine

## 2017-08-06 ENCOUNTER — Other Ambulatory Visit: Payer: Self-pay | Admitting: Nurse Practitioner

## 2017-08-06 MED ORDER — CYCLOBENZAPRINE HCL 10 MG PO TABS: 10.0000 mg | ORAL_TABLET | Freq: Three times a day (TID) | ORAL | 0 refills | Status: DC | PRN

## 2017-08-06 NOTE — Telephone Encounter (Signed)
Sent in muscle relaxer

## 2017-08-06 NOTE — Telephone Encounter (Signed)
Patient wants to know if something for pain (such as a muscle relaxer) can be called in because she is experiencing pain in her arm from the steel plate she has in it.  She said the surgeon who did this years ago is not in town anymore.   [facility] [employer]

## 2017-08-09 NOTE — Telephone Encounter (Signed)
Pt.notified

## 2017-08-09 NOTE — Telephone Encounter (Signed)
Left message to return call 

## 2017-09-02 ENCOUNTER — Other Ambulatory Visit: Payer: Self-pay | Admitting: Nurse Practitioner

## 2017-09-03 ENCOUNTER — Other Ambulatory Visit: Payer: Self-pay | Admitting: Family Medicine

## 2017-09-03 NOTE — Telephone Encounter (Signed)
May have 1 refill, send green card recommending follow-up office visit before further refills

## 2017-10-11 ENCOUNTER — Encounter: Payer: Self-pay | Admitting: Podiatry

## 2017-10-11 ENCOUNTER — Ambulatory Visit (INDEPENDENT_AMBULATORY_CARE_PROVIDER_SITE_OTHER): Payer: BLUE CROSS/BLUE SHIELD

## 2017-10-11 ENCOUNTER — Other Ambulatory Visit: Payer: Self-pay | Admitting: Podiatry

## 2017-10-11 ENCOUNTER — Ambulatory Visit (INDEPENDENT_AMBULATORY_CARE_PROVIDER_SITE_OTHER): Payer: BLUE CROSS/BLUE SHIELD | Admitting: Podiatry

## 2017-10-11 VITALS — BP 106/68 | HR 75 | Resp 16

## 2017-10-11 DIAGNOSIS — M779 Enthesopathy, unspecified: Secondary | ICD-10-CM

## 2017-10-11 DIAGNOSIS — M778 Other enthesopathies, not elsewhere classified: Secondary | ICD-10-CM

## 2017-10-11 MED ORDER — TRIAMCINOLONE ACETONIDE 10 MG/ML IJ SUSP
10.0000 mg | Freq: Once | INTRAMUSCULAR | Status: AC
  Administered 2017-10-11: 10 mg

## 2017-10-12 NOTE — Progress Notes (Signed)
Subjective:   Patient ID: Peggy Franklin, female   DOB: 47 y.o.   MRN: [id number]   HPI Patient presents stating that she is had a lot of pain and both feet are makes it hard for her to be comfortable.  States that it scared ongoing for the last few months and is gradually becoming more difficult for her to be ambulatory and she points to the outside of both feet.  Patient does not smoke and likes to be active   Review of Systems  All other systems reviewed and are negative.       Objective:  Physical Exam  Constitutional: She appears well-developed and well-nourished.  Cardiovascular: Intact distal pulses.  Pulmonary/Chest: Effort normal.  Musculoskeletal: Normal range of motion.  Neurological: She is alert.  Skin: Skin is warm.  Nursing note and vitals reviewed.   Neurovascular status found to be intact muscle strength is adequate range of motion within normal limits.  Patient is found to have chronic discomfort lateral side of both feet around the fifth metatarsal base with inflammation fluid buildup and inability to walk comfortably.  Patient is noted to have good digital perfusion and is well oriented x3     Assessment:  Inflammatory tendinitis with fluid buildup around the outside of both feet that is very painful when palpated     Plan:  H&P conditions reviewed and at this point careful injection administered bilateral 3 mg Kenalog 5 mg Xylocaine advised on physical therapy anti-inflammatories and possibility of orthotic treatment.  Patient will be seen back to reevaluate and see the results of this treatment  X-ray indicates that there is no indications of fracture with mild arthritis and spur formation

## 2017-11-10 ENCOUNTER — Other Ambulatory Visit: Payer: Self-pay | Admitting: Family Medicine

## 2018-01-10 ENCOUNTER — Other Ambulatory Visit: Payer: Self-pay | Admitting: Family Medicine

## 2018-02-01 DIAGNOSIS — B070 Plantar wart: Secondary | ICD-10-CM | POA: Diagnosis not present

## 2018-02-01 DIAGNOSIS — M79671 Pain in right foot: Secondary | ICD-10-CM | POA: Diagnosis not present

## 2018-03-14 ENCOUNTER — Other Ambulatory Visit: Payer: Self-pay | Admitting: Family Medicine

## 2018-03-24 DIAGNOSIS — B070 Plantar wart: Secondary | ICD-10-CM | POA: Diagnosis not present

## 2018-03-24 DIAGNOSIS — M79672 Pain in left foot: Secondary | ICD-10-CM | POA: Diagnosis not present

## 2018-04-08 ENCOUNTER — Ambulatory Visit: Payer: BLUE CROSS/BLUE SHIELD | Admitting: Adult Health

## 2018-04-08 ENCOUNTER — Encounter: Payer: Self-pay | Admitting: Adult Health

## 2018-04-08 VITALS — BP 117/75 | HR 78 | Ht 61.0 in | Wt 112.8 lb

## 2018-04-08 DIAGNOSIS — F419 Anxiety disorder, unspecified: Secondary | ICD-10-CM

## 2018-04-08 DIAGNOSIS — A630 Anogenital (venereal) warts: Secondary | ICD-10-CM

## 2018-04-08 MED ORDER — ESCITALOPRAM OXALATE 10 MG PO TABS: 10.0000 mg | ORAL_TABLET | Freq: Every day | ORAL | 3 refills | Status: DC

## 2018-04-08 MED ORDER — IMIQUIMOD 5 % EX CREA: TOPICAL_CREAM | CUTANEOUS | 2 refills | Status: DC

## 2018-04-08 NOTE — Progress Notes (Addendum)
  Subjective:     Patient ID: Peggy Franklin, female   DOB: 1971-05-16, 47 y.o.   MRN: [id number]  HPI Peggy Franklin is a 47 year old white female in complaining of ?skin tags, they hurt when she wipes.She is having some anxiety over death anniversaries of several loved ones.She says she cries a lot. PCP is [facility].  Review of Systems Has ? Skin tags in vaginal area +anxiety,cries alot  Reviewed past medical,surgical, social and family history. Reviewed medications and allergies.     Objective:   Physical Exam BP 117/75 (BP Location: Right Arm, Patient Position: Sitting, Cuff Size: Normal)   Pulse 78   Ht 5\' 1"  (1.549 m)   Wt 112 lb 12.8 oz (51.2 kg)   BMI 21.31 kg/m    PHQ 2 score 0. Skin warm and dry, has several areas at introitus and rectal area that look like genital warts. Will start on lexapro now and let's try aldara, and if does not resolve may excise at follow up.  Examination chaperoned by Diona Fanti CMA.  Assessment:     1. Genital warts   2. Anxiety       Plan:     Meds ordered this encounter  Medications  . imiquimod (ALDARA) 5 % cream    Sig: Apply topically 3 (three) times a week.    Dispense:  12 each    Refill:  2    Order Specific Question:   Supervising Provider    Answer:   Elonda Husky, LUTHER H [[id number]]  . escitalopram (LEXAPRO) 10 MG tablet    Sig: Take 1 tablet (10 mg total) by mouth daily.    Dispense:  30 tablet    Refill:  3    Order Specific Question:   Supervising Provider    Answer:   Florian Buff [[id number]]  Review handout on genital warts F/U in about 5 weeks

## 2018-04-08 NOTE — Patient Instructions (Signed)
Genital Warts Genital warts are small growths in the genital area or anal area. They are caused by a type of germ (HPV virus). This germ is spread from person to person during sex. It can be spread through vaginal, anal, and oral sex. Genital warts can lead to other problems if they are not treated. Follow these instructions at home: Medicines  Apply over-the-counter and prescription medicines only as told by your doctor.  Do not use medicines that are meant for treating hand warts.  Talk with your doctor about using anti-itch creams. General instructions  Do not touch or scratch the warts.  Do not have sex until your treatment is done.  Tell your current and past sexual partners about your condition. They may need treatment.  Keep all follow-up visits as told by your doctor. This is important.  After treatment, use condoms during sex. Other Instructions for Women  Women who have genital warts may need to be checked more often for cervical cancer.  If you become pregnant, tell your doctor that you have had genital warts. The germ can be passed to the baby. Contact a doctor if:  You have redness, swelling, or pain in the area of the treated skin.  You have a fever.  You feel generally sick.  You feel lumps in and around your genital area or anal area.  You have bleeding in your genital area or anal area.  You have pain during sex. This information is not intended to replace advice given to you by your health care provider. Make sure you discuss any questions you have with your health care provider. Document Released: 09/02/2009 Document Revised: 11/14/2015 Document Reviewed: 09/03/2014 Elsevier Interactive Patient Education  [employer].

## 2018-05-13 ENCOUNTER — Ambulatory Visit: Payer: BLUE CROSS/BLUE SHIELD | Admitting: Adult Health

## 2018-06-01 ENCOUNTER — Ambulatory Visit: Payer: BLUE CROSS/BLUE SHIELD | Admitting: Adult Health

## 2018-06-09 ENCOUNTER — Other Ambulatory Visit: Payer: Self-pay | Admitting: Family Medicine

## 2018-06-28 ENCOUNTER — Telehealth: Payer: Self-pay | Admitting: Family Medicine

## 2018-06-28 DIAGNOSIS — Z131 Encounter for screening for diabetes mellitus: Secondary | ICD-10-CM

## 2018-06-28 DIAGNOSIS — R5383 Other fatigue: Secondary | ICD-10-CM

## 2018-06-28 DIAGNOSIS — Z13220 Encounter for screening for lipoid disorders: Secondary | ICD-10-CM

## 2018-06-28 NOTE — Telephone Encounter (Signed)
Last labs 10/16/16 lipase, lipid, liver

## 2018-06-28 NOTE — Telephone Encounter (Signed)
Pt has an appt 07/01/18 for wellness exam with Dr.Scott, pt requesting blood work. Advise.

## 2018-06-29 NOTE — Telephone Encounter (Signed)
Blood work ordered in Epic. Left message to return call to notify patient. 

## 2018-06-29 NOTE — Telephone Encounter (Signed)
Lipid metabolic 7 CBC, liver

## 2018-06-29 NOTE — Telephone Encounter (Signed)
Pt returned call and informed that lab orders were placed.

## 2018-07-01 ENCOUNTER — Ambulatory Visit (INDEPENDENT_AMBULATORY_CARE_PROVIDER_SITE_OTHER): Payer: BLUE CROSS/BLUE SHIELD | Admitting: Family Medicine

## 2018-07-01 ENCOUNTER — Encounter: Payer: Self-pay | Admitting: Family Medicine

## 2018-07-01 VITALS — BP 92/62 | Temp 97.7°F | Ht 60.0 in | Wt 117.0 lb

## 2018-07-01 DIAGNOSIS — Z23 Encounter for immunization: Secondary | ICD-10-CM | POA: Diagnosis not present

## 2018-07-01 DIAGNOSIS — Z0000 Encounter for general adult medical examination without abnormal findings: Secondary | ICD-10-CM

## 2018-07-01 DIAGNOSIS — Z1239 Encounter for other screening for malignant neoplasm of breast: Secondary | ICD-10-CM

## 2018-07-01 MED ORDER — PANTOPRAZOLE SODIUM 40 MG PO TBEC: 40.0000 mg | DELAYED_RELEASE_TABLET | Freq: Every day | ORAL | 1 refills | Status: DC

## 2018-07-01 NOTE — Progress Notes (Signed)
   Subjective:    Patient ID: Peggy Franklin, female    DOB: 04-Sep-1970, 48 y.o.   MRN: [id number]  HPI  Patient is here today for her wellness exam and to follow up on her Jerrye Bushy and anxiety.  The patient comes in today for a wellness visit.  She has a history of Jerrye Bushy and takes protonix 40 mg one per day  She was given Lexapro 10 mg once per day for her anxiety/depression by Anderson Malta her obgyn,but she states she never took it.  A review of their health history was completed.  A review of medications was also completed.  Any needed refills; Yes on protonix  Eating habits: Little to none  Falls/  MVA accidents in past few months: No  Regular exercise:Yes daily  Specialist pt sees on regular basis: None  Preventative health issues were discussed.   Additional concerns: Thinks she has a sinus infection,Runny nose her right ear is stopped up for the last 4 days. She is taking otc sudafed sinus.  Patient states she see's [facility] for her female needs.   Review of Systems  Constitutional: Negative for activity change, appetite change and fatigue.  HENT: Negative for congestion and rhinorrhea.   Eyes: Negative for discharge.  Respiratory: Negative for cough, chest tightness and wheezing.   Cardiovascular: Negative for chest pain.  Gastrointestinal: Negative for abdominal pain, blood in stool and vomiting.  Endocrine: Negative for polyphagia.  Genitourinary: Negative for difficulty urinating and frequency.  Musculoskeletal: Negative for neck pain.  Skin: Negative for color change.  Allergic/Immunologic: Negative for environmental allergies and food allergies.  Neurological: Negative for weakness and headaches.  Psychiatric/Behavioral: Negative for agitation and behavioral problems.       Objective:   Physical Exam Constitutional:      Appearance: She is well-developed.  HENT:     Head: Normocephalic.     Right Ear: External ear normal.     Left Ear: External ear  normal.  Eyes:     Pupils: Pupils are equal, round, and reactive to light.  Neck:     Musculoskeletal: Normal range of motion.     Thyroid: No thyromegaly.  Cardiovascular:     Rate and Rhythm: Normal rate and regular rhythm.     Heart sounds: Normal heart sounds. No murmur.  Pulmonary:     Effort: Pulmonary effort is normal. No respiratory distress.     Breath sounds: Normal breath sounds. No wheezing.  Abdominal:     General: Bowel sounds are normal. There is no distension.     Palpations: Abdomen is soft. There is no mass.     Tenderness: There is no abdominal tenderness.  Musculoskeletal: Normal range of motion.        General: No tenderness.  Lymphadenopathy:     Cervical: No cervical adenopathy.  Skin:    General: Skin is warm and dry.  Neurological:     Mental Status: She is alert and oriented to person, place, and time.     Motor: No abnormal muscle tone.  Psychiatric:        Behavior: Behavior normal.           Assessment & Plan:  Screening labs recommended  Healthy diet regular physical activity recommended  Mammogram recommended  Flu shot recommended  Reflux doing well uses Protonix on a daily basis  Patient does smoke she we encourage her to quit smoking.  She will try her best she does not want any medications

## 2018-07-08 DIAGNOSIS — R5383 Other fatigue: Secondary | ICD-10-CM | POA: Diagnosis not present

## 2018-07-08 DIAGNOSIS — Z13220 Encounter for screening for lipoid disorders: Secondary | ICD-10-CM | POA: Diagnosis not present

## 2018-07-08 DIAGNOSIS — Z131 Encounter for screening for diabetes mellitus: Secondary | ICD-10-CM | POA: Diagnosis not present

## 2018-07-09 LAB — LIPID PANEL
CHOL/HDL RATIO: 2.8 ratio (ref 0.0–4.4)
Cholesterol, Total: 157 mg/dL (ref 100–199)
HDL: 56 mg/dL (ref >39)
LDL Calculated: 88 mg/dL (ref 0–99)
Triglycerides: 64 mg/dL (ref 0–149)
VLDL Cholesterol Cal: 13 mg/dL (ref 5–40)

## 2018-07-09 LAB — CBC WITH DIFFERENTIAL/PLATELET
Basophils Absolute: 0 10*3/uL (ref 0.0–0.2)
Basos: 0 %
EOS (ABSOLUTE): 0.1 10*3/uL (ref 0.0–0.4)
Eos: 1 %
Hematocrit: 39.1 % (ref 34.0–46.6)
Hemoglobin: 13.5 g/dL (ref 11.1–15.9)
Immature Grans (Abs): 0 10*3/uL (ref 0.0–0.1)
Immature Granulocytes: 0 %
Lymphocytes Absolute: 1.4 10*3/uL (ref 0.7–3.1)
Lymphs: 20 %
MCH: 30.8 pg (ref 26.6–33.0)
MCHC: 34.5 g/dL (ref 31.5–35.7)
MCV: 89 fL (ref 79–97)
Monocytes Absolute: 0.6 10*3/uL (ref 0.1–0.9)
Monocytes: 8 %
Neutrophils Absolute: 5 10*3/uL (ref 1.4–7.0)
Neutrophils: 71 %
Platelets: 243 10*3/uL (ref 150–450)
RBC: 4.38 x10E6/uL (ref 3.77–5.28)
RDW: 12.1 % (ref 11.7–15.4)
WBC: 7.1 10*3/uL (ref 3.4–10.8)

## 2018-07-09 LAB — BASIC METABOLIC PANEL
BUN/Creatinine Ratio: 15 (ref 9–23)
BUN: 9 mg/dL (ref 6–24)
CO2: 25 mmol/L (ref 20–29)
Calcium: 9 mg/dL (ref 8.7–10.2)
Chloride: 99 mmol/L (ref 96–106)
Creatinine, Ser: 0.59 mg/dL (ref 0.57–1.00)
GFR calc Af Amer: 125 mL/min/{1.73_m2} (ref >59)
GFR calc non Af Amer: 109 mL/min/{1.73_m2} (ref >59)
Glucose: 97 mg/dL (ref 65–99)
Potassium: 4.2 mmol/L (ref 3.5–5.2)
SODIUM: 142 mmol/L (ref 134–144)

## 2018-07-09 LAB — HEPATIC FUNCTION PANEL
ALT: 8 IU/L (ref 0–32)
AST: 11 IU/L (ref 0–40)
Albumin: 4.5 g/dL (ref 3.5–5.5)
Alkaline Phosphatase: 79 IU/L (ref 39–117)
BILIRUBIN, DIRECT: 0.11 mg/dL (ref 0.00–0.40)
Bilirubin Total: 0.3 mg/dL (ref 0.0–1.2)
Total Protein: 6.7 g/dL (ref 6.0–8.5)

## 2018-07-10 ENCOUNTER — Encounter: Payer: Self-pay | Admitting: Family Medicine

## 2018-07-11 ENCOUNTER — Encounter (HOSPITAL_COMMUNITY): Payer: Self-pay

## 2018-07-11 ENCOUNTER — Ambulatory Visit (HOSPITAL_COMMUNITY)
Admission: RE | Admit: 2018-07-11 | Discharge: 2018-07-11 | Disposition: A | Discharge status: Home / Self Care | Payer: BLUE CROSS/BLUE SHIELD | Source: Ambulatory Visit | Attending: Family Medicine | Admitting: Family Medicine

## 2018-07-11 DIAGNOSIS — Z1231 Encounter for screening mammogram for malignant neoplasm of breast: Secondary | ICD-10-CM | POA: Diagnosis not present

## 2018-07-11 DIAGNOSIS — Z1239 Encounter for other screening for malignant neoplasm of breast: Secondary | ICD-10-CM

## 2018-08-24 ENCOUNTER — Other Ambulatory Visit: Payer: Self-pay | Admitting: Family Medicine

## 2018-09-21 ENCOUNTER — Telehealth: Payer: Self-pay | Admitting: Adult Health

## 2018-09-21 NOTE — Telephone Encounter (Signed)
Pt reports pain when wiping and sitting. Denies any itching or discharge. Doesn't see anything on the skin. Does she need to be seen?

## 2018-09-21 NOTE — Telephone Encounter (Signed)
Patient called, states that she doesn't feel anything in her private area, but she's in excruciating pain when she wipes, she actually has to pat it because it hurts.  She stated it hurts when she is just sitting.  This started yesterday.  No itching.  [employer]  [phone]

## 2018-09-22 ENCOUNTER — Telehealth: Payer: Self-pay | Admitting: *Deleted

## 2018-09-22 NOTE — Telephone Encounter (Signed)
Spoke with pt letting her know no visitors or children at tomorrow's appt. Also, pt hasn't come in contact with someone in the last month that has been confirmed or suspected of having Covid-19 nor is she experiencing symptoms of Covid-19. Pt plans to come to visit. [place]

## 2018-09-23 ENCOUNTER — Ambulatory Visit: Payer: BLUE CROSS/BLUE SHIELD | Admitting: Adult Health

## 2018-09-23 ENCOUNTER — Other Ambulatory Visit: Payer: Self-pay

## 2018-09-23 ENCOUNTER — Telehealth: Payer: Self-pay | Admitting: *Deleted

## 2018-09-23 ENCOUNTER — Encounter: Payer: Self-pay | Admitting: Adult Health

## 2018-09-23 VITALS — BP 112/68 | HR 87 | Temp 97.8°F | Ht 61.0 in | Wt 116.5 lb

## 2018-09-23 DIAGNOSIS — N9089 Other specified noninflammatory disorders of vulva and perineum: Secondary | ICD-10-CM

## 2018-09-23 DIAGNOSIS — L739 Follicular disorder, unspecified: Secondary | ICD-10-CM | POA: Diagnosis not present

## 2018-09-23 DIAGNOSIS — K644 Residual hemorrhoidal skin tags: Secondary | ICD-10-CM

## 2018-09-23 DIAGNOSIS — A630 Anogenital (venereal) warts: Secondary | ICD-10-CM | POA: Diagnosis not present

## 2018-09-23 MED ORDER — VALACYCLOVIR HCL 1 G PO TABS: 1000.0000 mg | ORAL_TABLET | Freq: Two times a day (BID) | ORAL | 1 refills | Status: DC

## 2018-09-23 MED ORDER — SULFAMETHOXAZOLE-TRIMETHOPRIM 800-160 MG PO TABS: 1.0000 | ORAL_TABLET | Freq: Two times a day (BID) | ORAL | 1 refills | Status: DC

## 2018-09-23 MED ORDER — LIDOCAINE 5 % EX OINT: 1.0000 "application " | TOPICAL_OINTMENT | Freq: Four times a day (QID) | CUTANEOUS | 1 refills | Status: DC | PRN

## 2018-09-23 NOTE — Progress Notes (Signed)
Patient ID: Peggy Franklin, female   DOB: Dec 31, 1970, 48 y.o.   MRN: [id number] History of Present Illness: Peggy Franklin is a 48 year old white female, single, G3P2, sp hysterectomy, in complaining of vaginal pain since Monday, hurts to sit, and even laying hurts, has red area on right labia. PCP is [facility], [facility]    Current Medications, Allergies, Past Medical History, Past Surgical History, Family History and Social History were reviewed in [employer] record.     Review of Systems:  Patient denies any headaches,fever, or cough, hearing loss, fatigue, blurred vision, shortness of breath, chest pain, abdominal pain, problems with bowel movements, urination, or intercourse. No joint pain or mood swings. +pain in vaginal area since Monday, hurts to sit and has red area right labia,   ?boil left groin and still has skin tags/warts in rectal area that are aggravating, esp when trying to wipe.she used Aldara last year and they did not go away.    Physical Exam:BP 112/68 (BP Location: Left Arm, Patient Position: Sitting, Cuff Size: Normal)   Pulse 87   Temp 97.8 F (36.6 C)   Ht 5\' 1"  (1.549 m)   Wt 116 lb 8 oz (52.8 kg)   BMI 22.01 kg/m  General:  Well developed, well nourished, no acute distress Skin:  Warm and dry Pelvic:  External genitalia is normal in appearance, has area of folliculitis left groin, tender, but not red. She has lost weight and has extra skin in this area.  She has area of excoriation right labia,HSV culture obtained, and it is tender.The vagina is normal in appearance. Urethra has no lesions or masses. The cervix and uterus are absent. No adnexal masses or tenderness noted.Bladder is non tender, no masses felt. She has numerous skin tags and ?warts near anal area. Psych:  No mood changes, alert and cooperative,seems happy Fall risk is low. Examination chaperoned by Peggy Pupa LPN. Explained are on labia looks like herpes, will send culture and treat as  such.She says she has never had this before.  No sex for now, pat, do not rub, use antibacterial soaps.  Face time 15 minutes with 50 % counseling and coordinating care.  Impression: 1. Vulvar irritation      Will rx valtrex and lidocaine ointment - Herpes simplex virus culture sent  2. Folliculitis Will rx septra ds   3. Skin tags, anus or rectum      Will bring back for Dr Peggy Franklin to excise  4. AGW (anogenital warts)      Will get then excised by Dr Peggy Franklin    Plan: HSV culture sent Meds ordered this encounter  Medications  . valACYclovir (VALTREX) 1000 MG tablet    Sig: Take 1 tablet (1,000 mg total) by mouth 2 (two) times daily.    Dispense:  20 tablet    Refill:  1    Order Specific Question:   Supervising Provider    Answer:   Peggy Franklin, Peggy H [[id number]]  . sulfamethoxazole-trimethoprim (BACTRIM DS,SEPTRA DS) 800-160 MG tablet    Sig: Take 1 tablet by mouth 2 (two) times daily. Take 1 bid    Dispense:  20 tablet    Refill:  1    Order Specific Question:   Supervising Provider    Answer:   Franklin, Peggy H [[id number]]  . lidocaine (XYLOCAINE) 5 % ointment    Sig: Apply 1 application topically 4 (four) times daily as needed.    Dispense:  30 g  Refill:  1    Order Specific Question:   Supervising Provider    Answer:   Peggy Franklin [[id number]]  Return 4/16 to see Dr Peggy Franklin for excision of skin tags/warts

## 2018-09-23 NOTE — Telephone Encounter (Signed)
Informed pt that her insurance does not cover the ointment that jennifer prescribed. Advised that she could go to [place] and get the OTC cream that has a lesser strength. Pt verbalized understanding.

## 2018-09-26 LAB — HERPES SIMPLEX VIRUS CULTURE

## 2018-10-05 ENCOUNTER — Telehealth: Payer: Self-pay | Admitting: *Deleted

## 2018-10-05 NOTE — Telephone Encounter (Signed)
Called to prescreen patient but she would like to reschedule.  Appt changed per pt request.

## 2018-10-06 ENCOUNTER — Ambulatory Visit: Payer: BLUE CROSS/BLUE SHIELD | Admitting: Obstetrics and Gynecology

## 2018-10-07 ENCOUNTER — Telehealth: Payer: Self-pay | Admitting: *Deleted

## 2018-10-07 NOTE — Telephone Encounter (Signed)
mychart message with restrictions

## 2018-10-10 ENCOUNTER — Ambulatory Visit: Payer: BLUE CROSS/BLUE SHIELD | Admitting: Obstetrics and Gynecology

## 2018-10-10 ENCOUNTER — Encounter: Payer: Self-pay | Admitting: Obstetrics and Gynecology

## 2018-10-10 ENCOUNTER — Other Ambulatory Visit: Payer: Self-pay | Admitting: Obstetrics and Gynecology

## 2018-10-10 ENCOUNTER — Other Ambulatory Visit: Payer: Self-pay

## 2018-10-10 VITALS — BP 115/72 | HR 67 | Ht 61.0 in | Wt 118.6 lb

## 2018-10-10 DIAGNOSIS — A630 Anogenital (venereal) warts: Secondary | ICD-10-CM

## 2018-10-10 DIAGNOSIS — K644 Residual hemorrhoidal skin tags: Secondary | ICD-10-CM

## 2018-10-10 DIAGNOSIS — D071 Carcinoma in situ of vulva: Secondary | ICD-10-CM | POA: Diagnosis not present

## 2018-10-10 NOTE — Progress Notes (Signed)
Patient ID: Peggy Franklin, female   DOB: 01/20/1971, 48 y.o.   MRN: [id number]  Chief Complaint  Patient presents with  . Follow-up    Wart removal    HPI Peggy Franklin is a 48 y.o. female.  She is seen to have for vulvar lesions excised.  She has 2 perianal skin tags that appear to be old condyloma one just anterior to the anus at 12:00, 1 at 3:00 on the anal skin folds.  These will be specimen 1 Lesion  2 is a apparent condyloma on a skin fold on the posterior fourchette Lesion 3 is a 1 cm long by 5 mm wide intradermal somewhat firm and red lesion at 8:00 on the right buttock just lateral to the anal skin folds.  This will be removed as a full-thickness lesion  indication: #3 is an itchy slightly tender and white lesion of the vulva Symptoms:   more painful than pruritic on the right side, #3, the others are just inconvenient for personal care Location:  anus  No past medical history on file.  Past Surgical History:  Procedure Laterality Date  . ABDOMINAL HYSTERECTOMY    . LAPAROSCOPIC BILATERAL SALPINGO OOPHERECTOMY Bilateral     Family History  Problem Relation Age of Onset  . Cancer Mother 23       pancreatic cancer  . Cancer Maternal Grandmother        renal and uterine cancer  . Heart attack Maternal Grandmother   . Emphysema Paternal Grandfather   . Stroke Paternal Grandmother   . Other Maternal Grandfather        accident  . Heart attack Daughter     Social History Social History   Tobacco Use  . Smoking status: Current Every Day Smoker    Packs/day: 0.50    Types: Cigarettes    Start date: 04/06/2013  . Smokeless tobacco: Never Used  . Tobacco comment: Smokes E cig  Substance Use Topics  . Alcohol use: No  . Drug use: No    Allergies  Allergen Reactions  . Other     Steroids     Current Outpatient Medications  Medication Sig Dispense Refill  . pantoprazole (PROTONIX) 40 MG tablet TAKE 1 TABLET BY MOUTH DAILY 45 tablet 0  . lidocaine (XYLOCAINE) 5  % ointment Apply 1 application topically 4 (four) times daily as needed. (Patient not taking: Reported on 10/10/2018) 30 g 1  . sulfamethoxazole-trimethoprim (BACTRIM DS,SEPTRA DS) 800-160 MG tablet Take 1 tablet by mouth 2 (two) times daily. Take 1 bid (Patient not taking: Reported on 10/10/2018) 20 tablet 1  . valACYclovir (VALTREX) 1000 MG tablet Take 1 tablet (1,000 mg total) by mouth 2 (two) times daily. (Patient not taking: Reported on 10/10/2018) 20 tablet 1   No current facility-administered medications for this visit.     Review of Systems Review of Systems  Blood pressure 115/72, pulse 67, height 5\' 1"  (1.549 m), weight 118 lb 9.6 oz (53.8 kg).  Physical Exam Physical Exam Genitourinary:      Data Reviewed Inspection palpation and excision  Assessment Numbers 1 and 2 are old condyloma #3 is a perianal skin lesion, rule out skin cancer    Prepping with Betadine Local anesthesia with 1% Buffered Lidocaine x8 cc Numbers 1 and 2 were treated with silver nitrate Silver Nitrate applied:  Yes #3 was a full-thickness excision with a #13 blade and then a single layer subcuticular closure with 2 interrupted sutures of 4-0 Vicryl  Well tolerated  Specimen appropriately and separately identified and sent to pathology    Plan    Follow-up: 2 weeks weeks results by phone in 1 week    Topical Neosporin 3 times daily  Jonnie Kind 10/10/2018, 11:24 AM

## 2018-10-13 ENCOUNTER — Telehealth: Payer: Self-pay | Admitting: Obstetrics and Gynecology

## 2018-10-13 NOTE — Telephone Encounter (Signed)
Patient informed that there is no invasive cancer at any locations.  She is also informed that the edges of the condyloma excision were not "clear", even tho visually normal edges. These will need wider excision once healing from this biopsy complete. Pt reports healing well, "sore, but okay" Will plan to obtain wider biopsies in 3 months. Pt comfortable with this plan.

## 2018-12-12 ENCOUNTER — Telehealth: Payer: Self-pay | Admitting: *Deleted

## 2018-12-12 NOTE — Telephone Encounter (Addendum)
Patient called and stated she had severe abdominal pain over the weekend that has had he double up in fetal position. Patient stated that this am she work up with bad nausea as well and has a history of a tumor in her abdomen a few years ago. Advised patient she needs to go straight to ER. Patient is not comfortable going to ER due to covid and is heading to the urgent care.

## 2018-12-12 NOTE — Telephone Encounter (Signed)
So noted 

## 2019-01-24 ENCOUNTER — Telehealth: Payer: Self-pay | Admitting: Family Medicine

## 2019-01-24 NOTE — Telephone Encounter (Signed)
Patient would like a copy of her labs that was done in January mailed to her.

## 2019-01-24 NOTE — Telephone Encounter (Signed)
Message sent to medical records 

## 2019-01-30 NOTE — Telephone Encounter (Signed)
Mailed out information today

## 2019-02-23 ENCOUNTER — Other Ambulatory Visit: Payer: Self-pay | Admitting: Family Medicine

## 2019-04-17 ENCOUNTER — Other Ambulatory Visit: Payer: Self-pay

## 2019-04-17 ENCOUNTER — Encounter (HOSPITAL_COMMUNITY): Payer: Self-pay | Admitting: Emergency Medicine

## 2019-04-17 DIAGNOSIS — R140 Abdominal distension (gaseous): Secondary | ICD-10-CM | POA: Insufficient documentation

## 2019-04-17 DIAGNOSIS — R1084 Generalized abdominal pain: Secondary | ICD-10-CM | POA: Diagnosis not present

## 2019-04-17 DIAGNOSIS — Z5321 Procedure and treatment not carried out due to patient leaving prior to being seen by health care provider: Secondary | ICD-10-CM | POA: Diagnosis not present

## 2019-04-17 LAB — COMPREHENSIVE METABOLIC PANEL
ALT: 11 U/L (ref 0–44)
AST: 16 U/L (ref 15–41)
Albumin: 4 g/dL (ref 3.5–5.0)
Alkaline Phosphatase: 52 U/L (ref 38–126)
Anion gap: 9 (ref 5–15)
BUN: 12 mg/dL (ref 6–20)
CO2: 28 mmol/L (ref 22–32)
Calcium: 9.3 mg/dL (ref 8.9–10.3)
Chloride: 102 mmol/L (ref 98–111)
Creatinine, Ser: 0.58 mg/dL (ref 0.44–1.00)
GFR calc Af Amer: >60 mL/min (ref >60)
GFR calc non Af Amer: >60 mL/min (ref >60)
Glucose, Bld: 84 mg/dL (ref 70–99)
Potassium: 3.6 mmol/L (ref 3.5–5.1)
Sodium: 139 mmol/L (ref 135–145)
Total Bilirubin: 0.5 mg/dL (ref 0.3–1.2)
Total Protein: 6.7 g/dL (ref 6.5–8.1)

## 2019-04-17 LAB — CBC
HCT: 41.4 % (ref 36.0–46.0)
Hemoglobin: 13.3 g/dL (ref 12.0–15.0)
MCH: 30.1 pg (ref 26.0–34.0)
MCHC: 32.1 g/dL (ref 30.0–36.0)
MCV: 93.7 fL (ref 80.0–100.0)
Platelets: 188 10*3/uL (ref 150–400)
RBC: 4.42 MIL/uL (ref 3.87–5.11)
RDW: 12.2 % (ref 11.5–15.5)
WBC: 6.4 10*3/uL (ref 4.0–10.5)
nRBC: 0 % (ref 0.0–0.2)

## 2019-04-17 LAB — LIPASE, BLOOD: Lipase: 24 U/L (ref 11–51)

## 2019-04-17 MED ORDER — SODIUM CHLORIDE 0.9% FLUSH: 3.0000 mL | Freq: Once | INTRAVENOUS | Status: DC

## 2019-04-17 NOTE — ED Triage Notes (Signed)
Pt c/o generalized abd pain and feels bloated. Pt states she has been having normal bms.

## 2019-04-18 ENCOUNTER — Emergency Department (HOSPITAL_COMMUNITY)
Admission: EM | Admit: 2019-04-18 | Discharge: 2019-04-18 | Disposition: A | Discharge status: Home / Self Care | Payer: BC Managed Care – PPO | Attending: Emergency Medicine | Admitting: Emergency Medicine

## 2019-04-18 ENCOUNTER — Ambulatory Visit (INDEPENDENT_AMBULATORY_CARE_PROVIDER_SITE_OTHER): Payer: BC Managed Care – PPO | Admitting: Family Medicine

## 2019-04-18 ENCOUNTER — Encounter: Payer: Self-pay | Admitting: Family Medicine

## 2019-04-18 DIAGNOSIS — R1013 Epigastric pain: Secondary | ICD-10-CM

## 2019-04-18 MED ORDER — SUCRALFATE 1 G PO TABS: ORAL_TABLET | ORAL | 0 refills | Status: DC

## 2019-04-18 NOTE — Progress Notes (Signed)
   Subjective:  Audio only  Patient ID: Peggy Franklin, female    DOB: [birth year], 48 y.o.   MRN: [id number]  Abdominal Pain This is a new problem. Episode onset: Sunday. Associated symptoms comments: Pain/ pressure/bloating; Feels like she "swallowed a gallon of air". Pt states she tried to eat but "couldn't shake the feeling". Pt states pain doubled her over on Monday morning. (Pt states that she works 12 hours and nibbles throughout her shift). Pt called nurse line; they advised ER. Pt went to ER and states that she sit there for 6 hours and left. Pt taking Tums and Protonix. Doesn't feel like acid reflux. Pt states she had to wear jogging pants last night. . Treatments tried: Tums, Protonix  The treatment provided no relief.   Virtual Visit via Telephone Note  I connected with Peggy Franklin on 04/18/19 at 11:00 AM EDT by telephone and verified that I am speaking with the correct person using two identifiers.  Location: Patient: home Provider: office   I discussed the limitations, risks, security and privacy concerns of performing an evaluation and management service by telephone and the availability of in person appointments. I also discussed with the patient that there may be a patient responsible charge related to this service. The patient expressed understanding and agreed to proceed.   History of Present Illness:    Observations/Objective:   Assessment and Plan:   Follow Up Instructions:    I discussed the assessment and treatment plan with the patient. The patient was provided an opportunity to ask questions and all were answered. The patient agreed with the plan and demonstrated an understanding of the instructions.   The patient was advised to call back or seek an in-person evaluation if the symptoms worsen or if the condition fails to improve as anticipated.  I provided 20 minutes of non-face-to-face time during this encounter.  Patient notes fairly severe abdominal  pain.  Epigastrium primarily.  Some bloating sensation.  No vomiting no fever.  Pain was fairly impressive a couple nights ago.  And again last night was fairly severe.  Patient went to emergency room.  Had blood work drawn.  As she waited 6 hours before walking  Reports overall feeling better today Vicente Males, LPN    Review of Systems  Gastrointestinal: Positive for abdominal pain.       Objective:   Physical Exam  Virtual      Assessment & Plan:  Impression abdominal pain.  Some improvement.  Concerning for potential small bowel full or partial obstruction.  With patient's history of nature of prior GI surgery.  Rationale discussed with patient.  Add Carafate for potential exacerbation of reflux with element of gastritis etc.  Warning signs discussed carefully if worsens must go back to ER for full work-up

## 2019-04-18 NOTE — Telephone Encounter (Signed)
Patient has virtual visit scheduled today with Dr Richardson Landry

## 2019-04-18 NOTE — Telephone Encounter (Signed)
Please assist her in getting an appointment for her today at [phone] or [phone] That is the soonest we have available

## 2019-04-19 ENCOUNTER — Encounter: Payer: Self-pay | Admitting: Family Medicine

## 2019-06-15 ENCOUNTER — Other Ambulatory Visit: Payer: Self-pay | Admitting: Family Medicine

## 2019-06-19 ENCOUNTER — Other Ambulatory Visit: Payer: Self-pay | Admitting: *Deleted

## 2019-06-19 MED ORDER — PANTOPRAZOLE SODIUM 40 MG PO TBEC: DELAYED_RELEASE_TABLET | ORAL | 0 refills | Status: DC

## 2019-06-19 NOTE — Telephone Encounter (Signed)
May have 30-day needs to do medicine check within the next 30 days may be a virtual visit if patient desires to do so

## 2019-06-19 NOTE — Telephone Encounter (Signed)
Patient is scheduled for a physical on 07/09/18.

## 2019-06-19 NOTE — Telephone Encounter (Signed)
Please schedule appt and then send back to nurses to send in refill

## 2019-06-19 NOTE — Telephone Encounter (Signed)
Wellness on 07/01/18

## 2019-06-29 ENCOUNTER — Encounter: Payer: Self-pay | Admitting: Family Medicine

## 2019-07-02 ENCOUNTER — Other Ambulatory Visit: Payer: Self-pay | Admitting: Family Medicine

## 2019-07-10 ENCOUNTER — Encounter: Payer: Self-pay | Admitting: Family Medicine

## 2019-07-10 ENCOUNTER — Ambulatory Visit (INDEPENDENT_AMBULATORY_CARE_PROVIDER_SITE_OTHER): Payer: BC Managed Care – PPO | Admitting: Family Medicine

## 2019-07-10 ENCOUNTER — Other Ambulatory Visit: Payer: Self-pay

## 2019-07-10 VITALS — BP 102/68 | Temp 97.6°F | Ht 60.0 in | Wt 117.8 lb

## 2019-07-10 DIAGNOSIS — Z9081 Acquired absence of spleen: Secondary | ICD-10-CM

## 2019-07-10 DIAGNOSIS — Z0000 Encounter for general adult medical examination without abnormal findings: Secondary | ICD-10-CM

## 2019-07-10 DIAGNOSIS — Z720 Tobacco use: Secondary | ICD-10-CM | POA: Diagnosis not present

## 2019-07-10 DIAGNOSIS — Z13220 Encounter for screening for lipoid disorders: Secondary | ICD-10-CM

## 2019-07-10 DIAGNOSIS — Z23 Encounter for immunization: Secondary | ICD-10-CM

## 2019-07-10 HISTORY — DX: Acquired absence of spleen: Z90.81

## 2019-07-10 NOTE — Progress Notes (Signed)
Subjective:    Patient ID: Peggy Franklin, female    DOB: [birth year], 49 y.o.   MRN: [id number]  HPI The patient comes in today for a wellness visit.  Patient states she could do a better job eating healthier she is very busy works 5 to 6 days a week this makes it difficult for her to be able to set aside proper time to she does smoke she has been counseled to quit.  A review of their health history was completed.  A review of medications was also completed.  Any needed refills; protonix  Eating habits: does not eat health conscious  Falls/  MVA accidents in past few months: none  Regular exercise: just work  Sales promotion account executive pt sees on regular basis: none  Preventative health issues were discussed.   Additional concerns: none Declines pelvic exam. Will do breast exam.   We did discuss quitting tobacco we discussed various ways that she can do so including patches or Wellbutrin or Chantix.  Review of Systems  Constitutional: Negative for activity change, appetite change and fatigue.  HENT: Negative for congestion and rhinorrhea.   Eyes: Negative for discharge.  Respiratory: Negative for cough, chest tightness and wheezing.   Cardiovascular: Negative for chest pain.  Gastrointestinal: Negative for abdominal pain, blood in stool and vomiting.  Endocrine: Negative for polyphagia.  Genitourinary: Negative for difficulty urinating and frequency.  Musculoskeletal: Negative for neck pain.  Skin: Negative for color change.  Allergic/Immunologic: Negative for environmental allergies and food allergies.  Neurological: Negative for weakness and headaches.  Psychiatric/Behavioral: Negative for agitation and behavioral problems.       Objective:   Physical Exam Constitutional:      Appearance: She is well-developed.  HENT:     Head: Normocephalic.     Right Ear: External ear normal.     Left Ear: External ear normal.  Eyes:     Pupils: Pupils are equal, round, and reactive to  light.  Neck:     Thyroid: No thyromegaly.  Cardiovascular:     Rate and Rhythm: Normal rate and regular rhythm.     Heart sounds: Normal heart sounds. No murmur.  Pulmonary:     Effort: Pulmonary effort is normal. No respiratory distress.     Breath sounds: Normal breath sounds. No wheezing.  Abdominal:     General: Bowel sounds are normal. There is no distension.     Palpations: Abdomen is soft. There is no mass.     Tenderness: There is no abdominal tenderness.  Musculoskeletal:        General: No tenderness. Normal range of motion.     Cervical back: Normal range of motion.  Lymphadenopathy:     Cervical: No cervical adenopathy.  Skin:    General: Skin is warm and dry.  Neurological:     Mental Status: She is alert and oriented to person, place, and time.     Motor: No abnormal muscle tone.  Psychiatric:        Behavior: Behavior normal.           Assessment & Plan:  Adult wellness-complete.wellness physical was conducted today. Importance of diet and exercise were discussed in detail.  In addition to this a discussion regarding safety was also covered. We also reviewed over immunizations and gave recommendations regarding current immunization needed for age.  In addition to this additional areas were also touched on including: Preventative health exams needed:  Colonoscopy not indicated currently at age 14 it  will be  Patient was advised yearly wellness exam Tobacco abuse-patient was counseled to quit smoking.  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

## 2019-07-10 NOTE — Progress Notes (Signed)
Pt added to tickler file for colonoscopy in July when she turns 49

## 2019-07-10 NOTE — Progress Notes (Signed)
Left message to return call 

## 2019-07-11 LAB — LIPID PANEL
Chol/HDL Ratio: 2.6 ratio (ref 0.0–4.4)
Cholesterol, Total: 156 mg/dL (ref 100–199)
HDL: 61 mg/dL (ref >39)
LDL Chol Calc (NIH): 80 mg/dL (ref 0–99)
Triglycerides: 80 mg/dL (ref 0–149)
VLDL Cholesterol Cal: 15 mg/dL (ref 5–40)

## 2019-07-11 LAB — BASIC METABOLIC PANEL
BUN/Creatinine Ratio: 16 (ref 9–23)
BUN: 11 mg/dL (ref 6–24)
CO2: 25 mmol/L (ref 20–29)
Calcium: 9.1 mg/dL (ref 8.7–10.2)
Chloride: 101 mmol/L (ref 96–106)
Creatinine, Ser: 0.68 mg/dL (ref 0.57–1.00)
GFR calc Af Amer: 119 mL/min/{1.73_m2} (ref >59)
GFR calc non Af Amer: 103 mL/min/{1.73_m2} (ref >59)
Glucose: 83 mg/dL (ref 65–99)
Potassium: 4.5 mmol/L (ref 3.5–5.2)
Sodium: 140 mmol/L (ref 134–144)

## 2019-07-11 NOTE — Progress Notes (Signed)
Patient advised that Dr Nicki Reaper recommends the pneumococcal 23 vaccination. Patient states she will get it at a local pharmacy or call us back to schedule a nurse visit if she decides to get it at our office. 07/11/2019 @ 9:18am

## 2019-09-04 ENCOUNTER — Telehealth: Payer: Self-pay | Admitting: Family Medicine

## 2019-09-04 NOTE — Telephone Encounter (Signed)
Patient had the [place] to resend over papers for FMLA due to they denied her claim stating number 1 and 3 needing to be completed and needing to know frequency of her illness. I tried to explain to mom we didn't see her it was done [facility] 1/7 2021 and 2/15/201 when she had to leave work.So mom got notes sent over for you to review. They are requesting the time she spent at the ER with her daughter.Donnald Garre been trying to contact mom to get time she left work. Please review form and complete,date and sign. Form and records are in your folder for review to complete form.

## 2019-09-10 NOTE — Telephone Encounter (Signed)
So this is a form that when I review of the records the records sent to me as for the January 7 I do not see the February 15  Her daughter although an adult has severe disabilities and it is necessary for the mother to be available when she is in the ER has significant health problems  I tried to fill out these forms as best as I can but they will need additional information from the February visit as well as any other information mother gives regarding the times that she was out  Thank you

## 2019-09-11 NOTE — Telephone Encounter (Signed)
Faxed over FMLA with updates on them for daughter's paper work from her visit in the hospital

## 2019-09-26 ENCOUNTER — Encounter (HOSPITAL_COMMUNITY): Payer: Self-pay | Admitting: Emergency Medicine

## 2019-09-26 ENCOUNTER — Other Ambulatory Visit: Payer: Self-pay

## 2019-09-26 ENCOUNTER — Emergency Department (HOSPITAL_COMMUNITY): Payer: BC Managed Care – PPO

## 2019-09-26 ENCOUNTER — Emergency Department (HOSPITAL_COMMUNITY)
Admission: EM | Admit: 2019-09-26 | Discharge: 2019-09-26 | Disposition: A | Discharge status: Home / Self Care | Payer: BC Managed Care – PPO | Attending: Emergency Medicine | Admitting: Emergency Medicine

## 2019-09-26 ENCOUNTER — Ambulatory Visit (INDEPENDENT_AMBULATORY_CARE_PROVIDER_SITE_OTHER): Payer: BC Managed Care – PPO | Admitting: Family Medicine

## 2019-09-26 ENCOUNTER — Encounter: Payer: Self-pay | Admitting: Family Medicine

## 2019-09-26 DIAGNOSIS — Z20822 Contact with and (suspected) exposure to covid-19: Secondary | ICD-10-CM | POA: Diagnosis not present

## 2019-09-26 DIAGNOSIS — F17210 Nicotine dependence, cigarettes, uncomplicated: Secondary | ICD-10-CM | POA: Insufficient documentation

## 2019-09-26 DIAGNOSIS — R0989 Other specified symptoms and signs involving the circulatory and respiratory systems: Secondary | ICD-10-CM | POA: Diagnosis not present

## 2019-09-26 DIAGNOSIS — Z79899 Other long term (current) drug therapy: Secondary | ICD-10-CM | POA: Insufficient documentation

## 2019-09-26 DIAGNOSIS — J019 Acute sinusitis, unspecified: Secondary | ICD-10-CM

## 2019-09-26 DIAGNOSIS — R05 Cough: Secondary | ICD-10-CM | POA: Diagnosis not present

## 2019-09-26 LAB — SARS CORONAVIRUS 2 (TAT 6-24 HRS): SARS Coronavirus 2: NEGATIVE

## 2019-09-26 LAB — GROUP A STREP BY PCR: Group A Strep by PCR: NOT DETECTED

## 2019-09-26 MED ORDER — AZITHROMYCIN 250 MG PO TABS: ORAL_TABLET | ORAL | 0 refills | Status: DC

## 2019-09-26 NOTE — Progress Notes (Signed)
   Subjective:    Patient ID: Peggy Franklin, female    DOB: 1970/12/27, 49 y.o.   MRN: [id number]  Cough This is a new problem. Episode onset: Sunday afternoon. The cough is non-productive. Associated symptoms comments: Weak, stuffy nose, scratchy throat, nausea (not unusual for pt). Treatments tried: Ice Water, ginger ale and coffee for throat. Dayquil  Improvement on treatment: Coffee soothes throat.  Pt was seen at ED last night and was tested for COVID and step. Both were negative.  Virtual Visit via Telephone Note  I connected with Peggy Franklin on 09/26/19 at 10:00 AM EDT by telephone and verified that I am speaking with the correct person using two identifiers.  Location: Patient: home Provider: office   I discussed the limitations, risks, security and privacy concerns of performing an evaluation and management service by telephone and the availability of in person appointments. I also discussed with the patient that there may be a patient responsible charge related to this service. The patient expressed understanding and agreed to proceed.   History of Present Illness:    Observations/Objective:   Assessment and Plan:   Follow Up Instructions:    I discussed the assessment and treatment plan with the patient. The patient was provided an opportunity to ask questions and all were answered. The patient agreed with the plan and demonstrated an understanding of the instructions.   The patient was advised to call back or seek an in-person evaluation if the symptoms worsen or if the condition fails to improve as anticipated.  I provided 17 minutes of non-face-to-face time during this encounter.   ER note reviewed    Review of Systems  Respiratory: Positive for cough.        Objective:   Physical Exam  Virtual unable to do physical exam      Assessment & Plan:  Viral syndrome Stay out of work next few days Await the results of Covid Acute bronchitis Patient  concerned about bacterial component Antibiotics were sent in follow-up if progressive troubles Warning signs were discussed in detail

## 2019-09-26 NOTE — ED Provider Notes (Signed)
[facility] EMERGENCY DEPARTMENT Provider Note   CSN: [id number] Arrival date & time: 09/26/19  0008   History Chief Complaint  Patient presents with  . multiple complaints    Peggy Franklin is a 49 y.o. female.  The history is provided by the patient.  She has history of asplenia and comes in complaining of a scratchy throat and a dry cough for the last 24 hours.  She denies fever, chills, sweats.  She denies nausea, vomiting, diarrhea.  She denies arthralgias or myalgias.  She denies dyspnea.  She states that she just does not feel well.  Her daughter returned from a trip to [place] 4 days ago and has developed symptoms suggestive of COVID-19.  Patient has not been vaccinated against COVID-19.  Past Medical History:  Diagnosis Date  . History of asplenia 07/10/2019    Patient Active Problem List   Diagnosis Date Noted  . History of asplenia 07/10/2019  . Genital warts 04/08/2018  . Anxiety 04/08/2018  . Anal skin tag 03/28/2015  . Gastroesophageal reflux disease without esophagitis 03/07/2015  . Postsurgical dumping syndrome 01/20/2013    Past Surgical History:  Procedure Laterality Date  . ABDOMINAL HYSTERECTOMY    . COLON SURGERY    . LAPAROSCOPIC BILATERAL SALPINGO OOPHERECTOMY Bilateral      OB History    Gravida  3   Para  3   Term  2   Preterm  1   AB      Living  2     SAB      TAB      Ectopic      Multiple      Live Births              Family History  Problem Relation Age of Onset  . Cancer Mother 34       pancreatic cancer  . Cancer Maternal Grandmother        renal and uterine cancer  . Heart attack Maternal Grandmother   . Emphysema Paternal Grandfather   . Stroke Paternal Grandmother   . Other Maternal Grandfather        accident  . Heart attack Daughter     Social History   Tobacco Use  . Smoking status: Current Every Day Smoker    Packs/day: 0.50    Types: Cigarettes    Start date: 04/06/2013  . Smokeless  tobacco: Never Used  . Tobacco comment: Smokes E cig  Substance Use Topics  . Alcohol use: No  . Drug use: No    Home Medications Prior to Admission medications   Medication Sig Start Date End Date Taking? Authorizing Provider  pantoprazole (PROTONIX) 40 MG tablet TAKE 1 TABLET(40 MG) BY MOUTH DAILY 06/19/19   Kathyrn Drown, MD    Allergies    Other  Review of Systems   Review of Systems  All other systems reviewed and are negative.   Physical Exam Updated Vital Signs BP 114/72 (BP Location: Left Arm)   Pulse 76   Temp 98.3 F (36.8 C) (Oral)   Resp 18   Ht 5\' 1"  (1.549 m)   Wt 49 kg   SpO2 98%   BMI 20.41 kg/m   Physical Exam Vitals and nursing note reviewed.   48 year old female, resting comfortably and in no acute distress. Vital signs are normal. Oxygen saturation is 98%, which is normal. Head is normocephalic and atraumatic. PERRLA, EOMI. Oropharynx is clear.  Tympanic membranes are  clear. Neck is nontender and supple without adenopathy or JVD. Back is nontender and there is no CVA tenderness. Lungs are clear without rales, wheezes, or rhonchi. Chest is nontender. Heart has regular rate and rhythm without murmur. Abdomen is soft, flat, nontender without masses or hepatosplenomegaly and peristalsis is normoactive. Extremities have no cyanosis or edema, full range of motion is present. Skin is warm and dry without rash. Neurologic: Mental status is normal, cranial nerves are intact, there are no motor or sensory deficits.  ED Results / Procedures / Treatments   Labs (all labs ordered are listed, but only abnormal results are displayed) Labs Reviewed  GROUP A STREP BY PCR  SARS CORONAVIRUS 2 (TAT 6-24 HRS)    Radiology DG Chest Port 1 View  Result Date: 09/26/2019 CLINICAL DATA:  Initial evaluation for acute cough. EXAM: PORTABLE CHEST 1 VIEW COMPARISON:  Prior radiograph from 09/04/2007. FINDINGS: The cardiac and mediastinal silhouettes are stable in  size and contour, and remain within normal limits. Aortic atherosclerosis. The lungs are normally inflated. No airspace consolidation, pleural effusion, or pulmonary edema. No pneumothorax. No acute osseous abnormality. IMPRESSION: 1. No radiographic evidence for active cardiopulmonary disease. 2.  Aortic Atherosclerosis (ICD10-I70.0). Electronically Signed   By: Jeannine Boga M.D.   On: 09/26/2019 01:51    Procedures Procedures  Medications Ordered in ED Medications - No data to display  ED Course  I have reviewed the triage vital signs and the nursing notes.  Pertinent labs & imaging results that were available during my care of the patient were reviewed by me and considered in my medical decision making (see chart for details).  Viral respiratory tract infection with pharyngitis.  Possible exposure to COVID-19.  Will check COVID-19 PCR swab and will check chest x-ray and strep screen.  Old records are reviewed, and she has no relevant past visits.  Strep screen is negative.  Chest x-ray shows no evidence of pneumonia.  Clinical presentation is strongly suggestive of COVID-19.  Patient vies to continue symptomatic treatment, return to the emergency department if she develops difficulty breathing.  MDM Rules/Calculators/A&P Peggy Franklin was evaluated in Emergency Department on 09/26/2019 for the symptoms described in the history of present illness. She was evaluated in the context of the global COVID-19 pandemic, which necessitated consideration that the patient might be at risk for infection with the SARS-CoV-2 virus that causes COVID-19. Institutional protocols and algorithms that pertain to the evaluation of patients at risk for COVID-19 are in a state of rapid change based on information released by regulatory bodies including the CDC and federal and state organizations. These policies and algorithms were followed during the patient's care in the ED.  Final Clinical Impression(s) / ED  Diagnoses Final diagnoses:  Suspected COVID-19 virus infection    Rx / DC Orders ED Discharge Orders    None       Delora Fuel, MD [id number]

## 2019-09-26 NOTE — ED Triage Notes (Signed)
Pt c/o of scratchy throat, headache, and cough.

## 2019-09-29 IMAGING — MG DIGITAL SCREENING BILATERAL MAMMOGRAM WITH TOMO AND CAD
8 series · 9 of 24 positions shown · non-contrast
Comparison: Previous exam(s).

CLINICAL DATA: Screening.

EXAM:
DIGITAL SCREENING BILATERAL MAMMOGRAM WITH TOMO AND CAD

[L CC synth-2D]
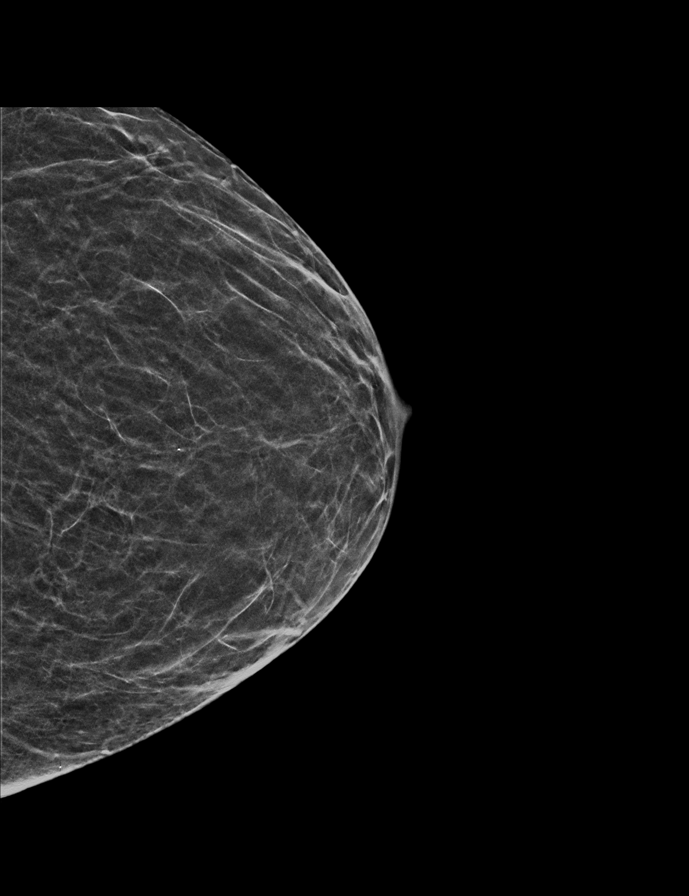

[L MLO synth-2D]
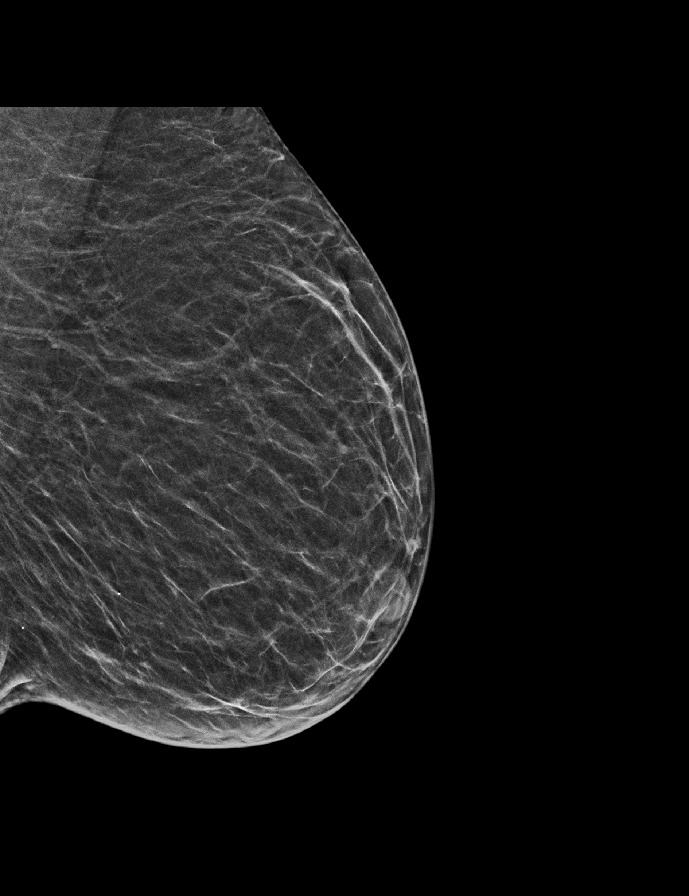

[R MLO synth-2D]
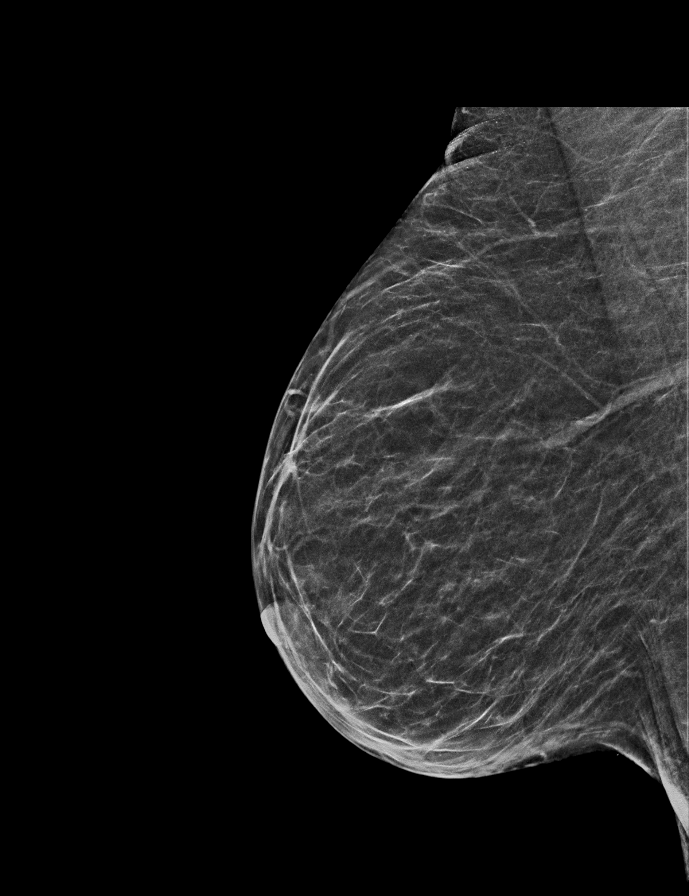

[R CC synth-2D]
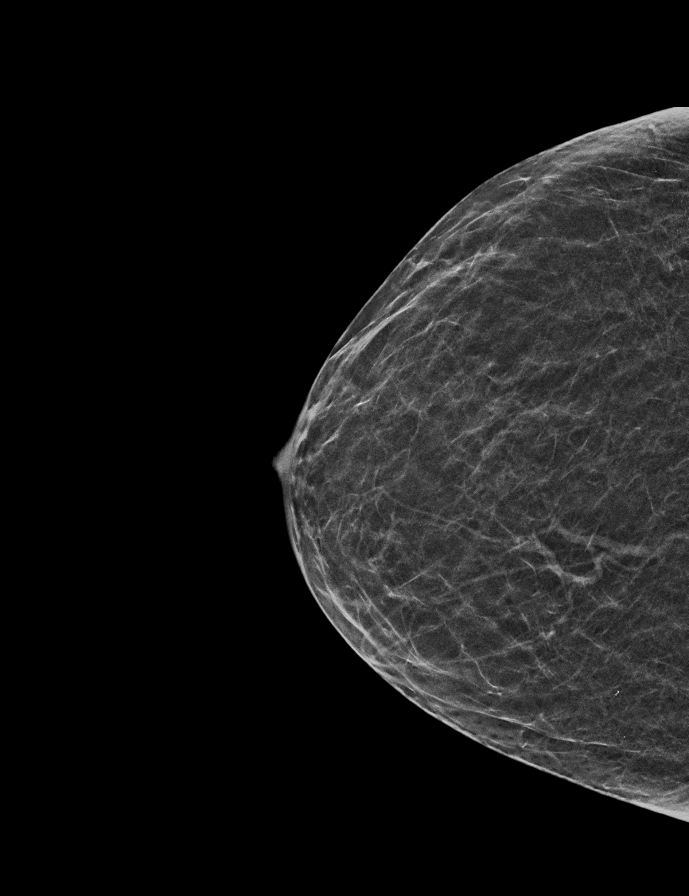

[R MLO tomo · 2 of 44 frames shown]
[frame 15/44]
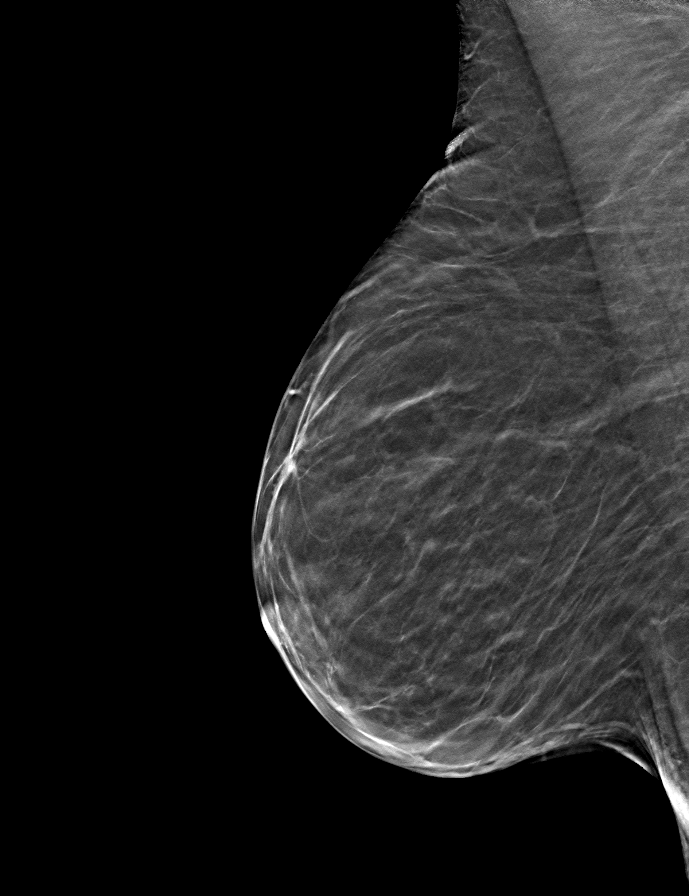
[frame 23/44]
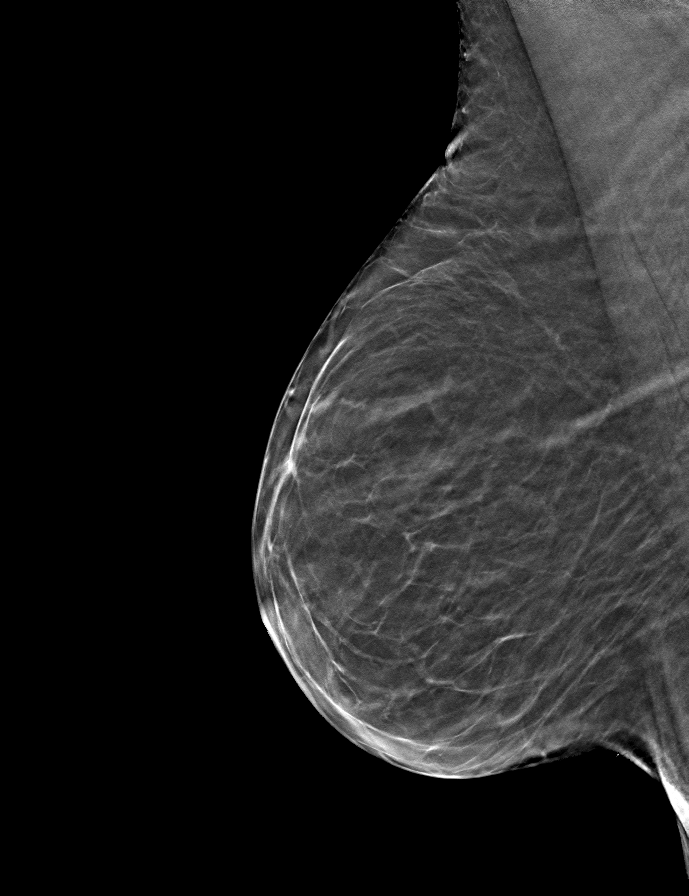

[L MLO tomo · tomo slice 21/42.0]
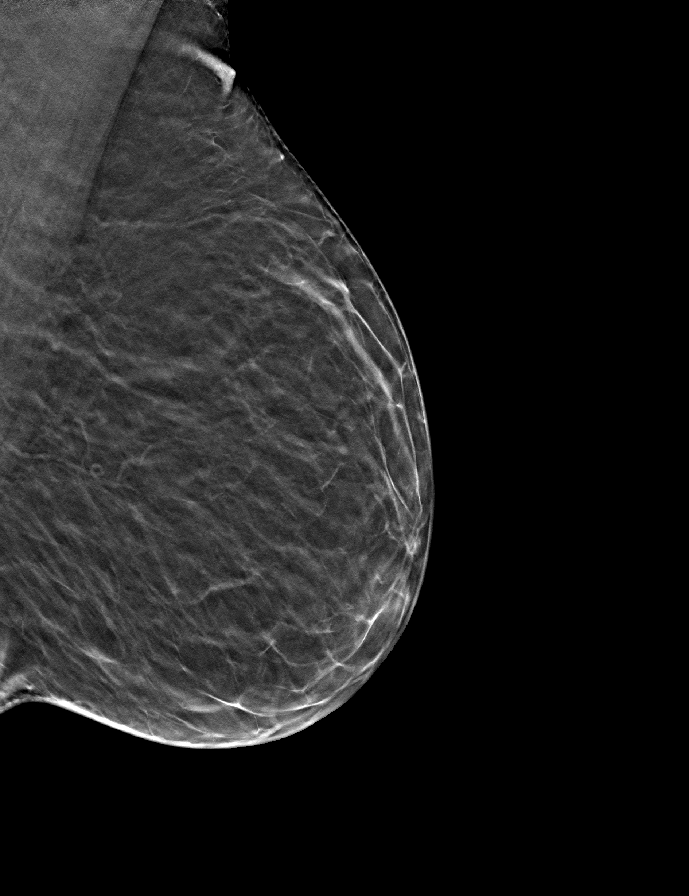

[R CC tomo · tomo slice 19/37.0]
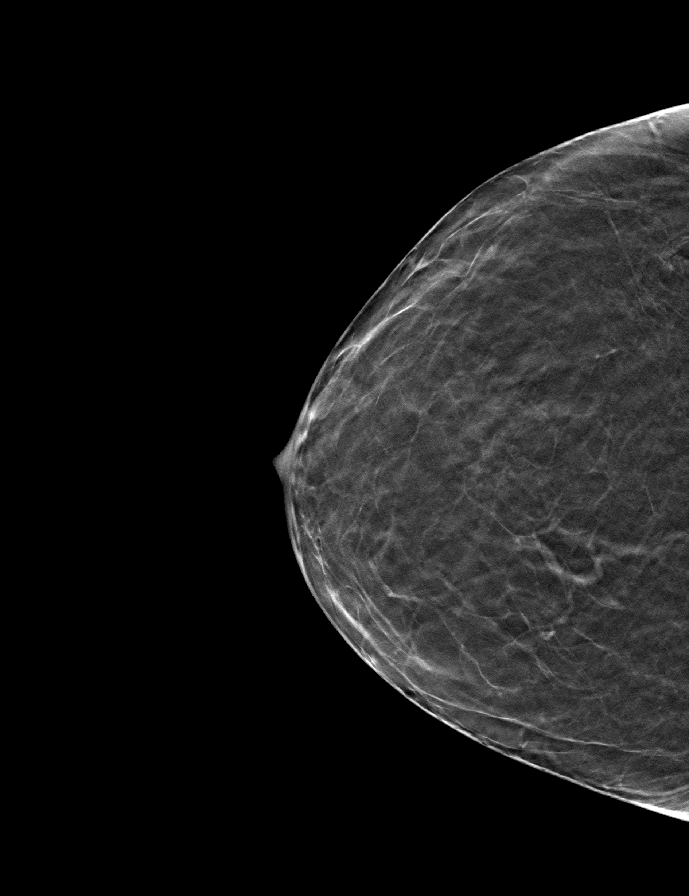

[L CC tomo · tomo slice 19/38.0]
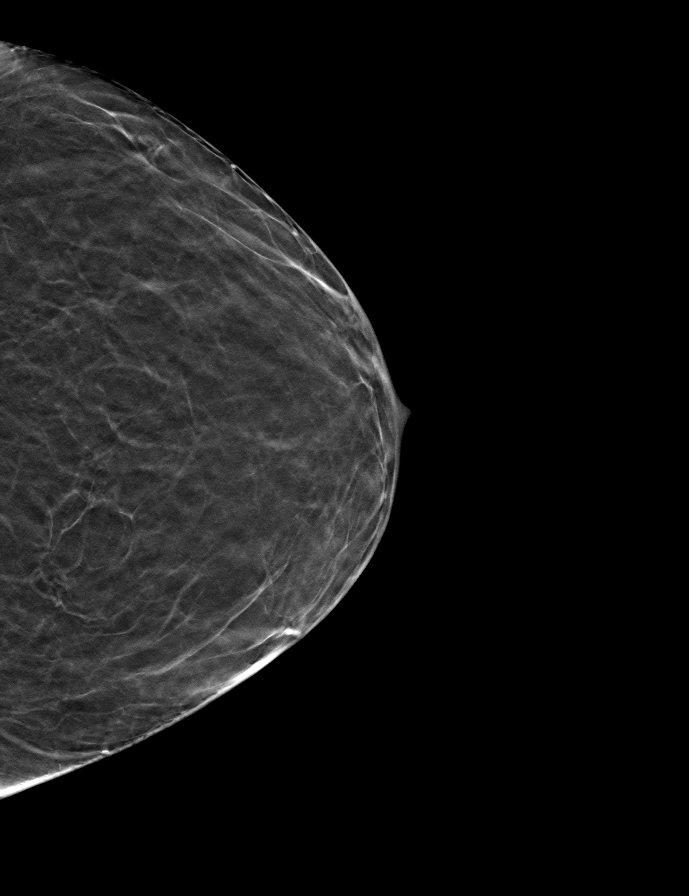

[9 of 24 positions shown; findings below may reference images not displayed]

ACR Breast Density Category b: There are scattered areas of
fibroglandular density.
FINDINGS: There are no findings suspicious for malignancy. Images were
processed with CAD.
IMPRESSION: No mammographic evidence of malignancy. A result letter of this
screening mammogram will be mailed directly to the patient.

RECOMMENDATION:
Screening mammogram in one year. (Code:[id number])

BI-RADS CATEGORY  1: Negative.

## 2019-10-30 DIAGNOSIS — L851 Acquired keratosis [keratoderma] palmaris et plantaris: Secondary | ICD-10-CM | POA: Diagnosis not present

## 2019-10-30 DIAGNOSIS — M7671 Peroneal tendinitis, right leg: Secondary | ICD-10-CM | POA: Diagnosis not present

## 2019-10-30 DIAGNOSIS — M79671 Pain in right foot: Secondary | ICD-10-CM | POA: Diagnosis not present

## 2019-10-31 ENCOUNTER — Ambulatory Visit: Payer: BC Managed Care – PPO | Admitting: Family Medicine

## 2019-10-31 ENCOUNTER — Encounter: Payer: Self-pay | Admitting: Family Medicine

## 2019-10-31 ENCOUNTER — Other Ambulatory Visit: Payer: Self-pay

## 2019-10-31 VITALS — BP 110/72 | HR 115 | Temp 97.8°F | Ht 61.0 in | Wt 115.6 lb

## 2019-10-31 DIAGNOSIS — B070 Plantar wart: Secondary | ICD-10-CM

## 2019-10-31 NOTE — Progress Notes (Signed)
    Patient ID: Peggy Franklin, female    DOB: May 23, 1971, 49 y.o.   MRN: [id number]   Chief Complaint  Patient presents with  . Foot Pain   Subjective:    HPI Pt seen for "Knot" on right foot. Very painful. Came up a couple of years ago and had surgery to remove but knot never completely went away but was not causing pain until a couple of weeks ago. Pt stating using lots of treatments otc for plantar wart, but not improving. epson salt soaks, apple cider vinegar, cushion pads, Wart removal topical tx, and duck tape. The area is getting more swollen and tender.  Affecting her ability to stand and work for 12 hrs shifts.  Had to be off this week due to the pain.  Has appt with podiatrist in 2 days but needing a work note about the rt foot pain. Pt was needing to get FMLA papers from her work due to this ailment.   Medical History Peggy Franklin has a past medical history of History of asplenia (07/10/2019).   Outpatient Encounter Medications as of 10/31/2019  Medication Sig  . pantoprazole (PROTONIX) 40 MG tablet TAKE 1 TABLET(40 MG) BY MOUTH DAILY  . [DISCONTINUED] azithromycin (ZITHROMAX Z-PAK) 250 MG tablet Take 2 tablets (500 mg) on  Day 1,  followed by 1 tablet (250 mg) once daily on Days 2 through 5.   No facility-administered encounter medications on file as of 10/31/2019.     Review of Systems  Constitutional: Negative for chills and fever.  Musculoskeletal: Negative.   Skin: Negative for rash and wound.       +rt foot pain/plantar wart     Vitals BP 110/72   Pulse (!) 115   Temp 97.8 F (36.6 C)   Ht 5\' 1"  (1.549 m)   Wt 115 lb 9.6 oz (52.4 kg)   SpO2 94%   BMI 21.84 kg/m   Objective:   Physical Exam Constitutional:      General: She is not in acute distress.    Appearance: Normal appearance. She is not ill-appearing.  Musculoskeletal:        General: Tenderness (rt lateral foot) present. Normal range of motion.     Right lower leg: No edema.     Left lower leg:  No edema.       Feet:  Skin:    General: Skin is warm and dry.     Findings: Lesion (rt lateral foot) present. No rash.  Neurological:     General: No focal deficit present.     Mental Status: She is alert and oriented to person, place, and time.      Assessment and Plan   1. Plantar wart of right foot   Pt not wanting to take medication for the pain.  Advising she could take tylenol or oibuprofne prn as needed.  Rest, ice, elevation, or warm soaks.  Follow up with podiatry. Pt in agreement.  F/u prn.  10/31/2019

## 2019-11-02 DIAGNOSIS — B070 Plantar wart: Secondary | ICD-10-CM | POA: Diagnosis not present

## 2019-11-02 DIAGNOSIS — M79672 Pain in left foot: Secondary | ICD-10-CM | POA: Diagnosis not present

## 2019-11-09 ENCOUNTER — Other Ambulatory Visit: Payer: Self-pay

## 2019-11-09 MED ORDER — PANTOPRAZOLE SODIUM 40 MG PO TBEC: DELAYED_RELEASE_TABLET | ORAL | 0 refills | Status: DC

## 2019-11-30 DIAGNOSIS — R2241 Localized swelling, mass and lump, right lower limb: Secondary | ICD-10-CM | POA: Diagnosis not present

## 2019-11-30 DIAGNOSIS — M79672 Pain in left foot: Secondary | ICD-10-CM | POA: Diagnosis not present

## 2019-12-06 DIAGNOSIS — R600 Localized edema: Secondary | ICD-10-CM | POA: Diagnosis not present

## 2019-12-06 DIAGNOSIS — M79671 Pain in right foot: Secondary | ICD-10-CM | POA: Diagnosis not present

## 2019-12-06 DIAGNOSIS — M67471 Ganglion, right ankle and foot: Secondary | ICD-10-CM | POA: Diagnosis not present

## 2019-12-06 DIAGNOSIS — R2241 Localized swelling, mass and lump, right lower limb: Secondary | ICD-10-CM | POA: Diagnosis not present

## 2019-12-11 ENCOUNTER — Other Ambulatory Visit: Payer: Self-pay | Admitting: *Deleted

## 2019-12-11 DIAGNOSIS — Z1211 Encounter for screening for malignant neoplasm of colon: Secondary | ICD-10-CM

## 2019-12-12 ENCOUNTER — Encounter: Payer: Self-pay | Admitting: Family Medicine

## 2019-12-13 ENCOUNTER — Encounter (INDEPENDENT_AMBULATORY_CARE_PROVIDER_SITE_OTHER): Payer: Self-pay | Admitting: *Deleted

## 2020-02-05 ENCOUNTER — Other Ambulatory Visit: Payer: Self-pay

## 2020-02-05 ENCOUNTER — Encounter: Payer: Self-pay | Admitting: Family Medicine

## 2020-02-05 ENCOUNTER — Telehealth (INDEPENDENT_AMBULATORY_CARE_PROVIDER_SITE_OTHER): Payer: BC Managed Care – PPO | Admitting: Family Medicine

## 2020-02-05 ENCOUNTER — Telehealth: Payer: Self-pay | Admitting: *Deleted

## 2020-02-05 VITALS — HR 80 | Temp 98.4°F | Resp 16

## 2020-02-05 DIAGNOSIS — R05 Cough: Secondary | ICD-10-CM

## 2020-02-05 DIAGNOSIS — R059 Cough, unspecified: Secondary | ICD-10-CM | POA: Insufficient documentation

## 2020-02-05 DIAGNOSIS — R0981 Nasal congestion: Secondary | ICD-10-CM | POA: Diagnosis not present

## 2020-02-05 NOTE — Telephone Encounter (Signed)
Peggy Franklin, Peggy Franklin are scheduled for a virtual visit with your provider today.    Just as we do with appointments in the office, we must obtain your consent to participate.  Your consent will be active for this visit and any virtual visit you may have with one of our providers in the next 365 days.    If you have a MyChart account, I can also send a copy of this consent to you electronically.  All virtual visits are billed to your insurance company just like a traditional visit in the office.  As this is a virtual visit, video technology does not allow for your provider to perform a traditional examination.  This may limit your provider's ability to fully assess your condition.  If your provider identifies any concerns that need to be evaluated in person or the need to arrange testing such as labs, EKG, etc, we will make arrangements to do so.    Although advances in technology are sophisticated, we cannot ensure that it will always work on either your end or our end.  If the connection with a video visit is poor, we may have to switch to a telephone visit.  With either a video or telephone visit, we are not always able to ensure that we have a secure connection.   I need to obtain your verbal consent now.   Are you willing to proceed with your visit today?   Peggy Franklin has provided verbal consent on 02/05/2020 for a virtual visit (video or telephone).   Patsy Lager, LPN 7/67/3419  3:79 AM

## 2020-02-05 NOTE — Progress Notes (Signed)
   Patient ID: Peggy Franklin, female    DOB: March 09, 1971, 49 y.o.   MRN: [id number]   Chief Complaint  Patient presents with  . Sinusitis   Subjective:    Sinusitis This is a new problem. Episode onset: yesterday  Associated symptoms include congestion, coughing and headaches. Pertinent negatives include no chills or ear pain. Treatments tried: otc sinus. The treatment provided mild relief.  Virtual Visit via Video Note  I connected with [name] on 02/05/20 at 11:20 AM EDT by a video enabled telemedicine application and verified that I am speaking with the correct person using two identifiers.  Location: Patient: home Provider: office   I discussed the limitations of evaluation and management by telemedicine and the availability of in person appointments. The patient expressed understanding and agreed to proceed.  History of Present Illness: Denies fever. Reports head is about to "explode" from pressure. Nasal congestion, tickle in throat. Pressure in face. Denies getting Covid vaccine.    Observations/Objective: Able to talk in complete sentences during interview.  Assessment and Plan: Needs Covid test. Will give work note pending results. Asked her to not take her daughter into [facility] today for her ultrasound- she agrees to sit in car. Continue to treat symptoms as you are doing. If this is bronchitis- it is usually viral and antibiotics are not appropriate for viral infections.  Isolate while your test result is pending.   Follow Up Instructions: Will notify you of test result by phone as soon as result is available.   I discussed the assessment and treatment plan with the patient. The patient was provided an opportunity to ask questions and all were answered. The patient agreed with the plan and demonstrated an understanding of the instructions.   The patient was advised to call back or seek an in-person evaluation if the symptoms worsen or if the condition fails to  improve as anticipated.  I provided 15 minutes of non-face-to-face time during this encounter.  Medical History [name] has a past medical history of History of asplenia (07/10/2019).   Outpatient Encounter Medications as of 02/05/2020  Medication Sig  . pantoprazole (PROTONIX) 40 MG tablet TAKE 1 TABLET(40 MG) BY MOUTH DAILY   No facility-administered encounter medications on file as of 02/05/2020.     Review of Systems  Constitutional: Negative for chills and fever.  HENT: Positive for congestion and sinus pain. Negative for ear pain.        Tickle in throat.  Respiratory: Positive for cough.   Neurological: Positive for headaches.     Vitals Pulse 80   Temp 98.4 F (36.9 C)   Resp 16   SpO2 91%    Objective:   Physical Exam Telephone visit/ met patient in parking lot for Covid test.   Assessment and Plan   1. Cough - Novel Coronavirus, NAA ([facility])  2. Head congestion   Continue to treat symptoms. Will notify you once Covid test results are back.  Will give you two days off work while [employer] test is pending.  Understands plan of care and that she needs to isolate until test is results. If symptoms worsen such as shortness of breath- seek immediate medical care.  [name], NP

## 2020-02-05 NOTE — Patient Instructions (Signed)
3 Key Steps to Take While Waiting for Your COVID-19 Test Result °To help stop the spread of COVID-19, take these 3 key steps NOW while waiting for your test results: °1. Stay home and monitor your health. °Stay home and monitor your health to help protect your friends, family, and others from possibly getting COVID-19 from you. °Stay home and away from others: °· If possible, stay away from others, especially people who are at higher risk for getting very sick from COVID-19, such as older adults and people with other medical conditions. °· If you have been in contact with someone with COVID-19, stay home and away from others for 14 days after your last contact with that person. °· If you have a fever, cough or other symptoms of COVID-19, stay home and away from others (except to get medical care). °Monitor your health: °· Watch for fever, cough, shortness of breath, or other symptoms of COVID-19. Remember, symptoms may appear 2-14 days after exposure to COVID-19 and can include: °? Fever or chills °? Cough °? Shortness of breath or difficulty breathing °? Tiredness °? Muscle or body aches °? Headache °? New loss of taste or smell °? Sore throat °? Congestion or runny nose °? Nausea or vomiting °? Diarrhea °2. Think about the people you have recently been around. °If you are diagnosed with COVID-19, a public health worker may call you to check on your health, discuss who you have been around, and ask where you spent time while you may have been able to spread COVID-19 to others. While you wait for your COVID-19 test result, think about everyone you have been around recently. This will be important information to give health workers if your test is positive.  °Complete the information on the back of this page to help you remember everyone you have been around. °3. Answer the phone call from the health department. °If a public health worker calls you, answer the call to help slow the spread of COVID-19 in your  community. °· Discussions with health department staff are confidential. This means that your personal and medical information will be kept private and only shared with those who may need to know, like your health care provider. °· Your name will not be shared with those you came in contact with. The health department will only notify people you were in close contact with (within 6 feet for more than 15 minutes) that they might have been exposed to COVID-19. °Think about the people you have recently been around °If you test positive and are diagnosed with COVID-19, someone from the health department may call to check-in on your health, discuss who you have been around, and ask where you spent time while you may have been able to spread COVID-19 to others. This form can help you think about people you have recently been around so you will be ready if a public health worker calls you. °Things to think about. Have you: °· Gone to work or school? °· Gotten together with others (eaten out at a restaurant, gone out for drinks, exercised with others or gone to a gym, had friends or family over to your house, volunteered, gone to a party, pool, or park)? °· Gone to a store in person (e.g., grocery store, mall)? °· Gone to in-person appointments (e.g., salon, barber, doctor's or dentist's office)? °· Ridden in a car with others (e.g., Uber or Lyft) or took public transportation? °· Been inside a church, synagogue, mosque or other places   of worship? °Who lives with you? °· ______________________________________________________________________ °· ______________________________________________________________________ °· ______________________________________________________________________ °· ______________________________________________________________________ °Who have you been around (within 6 feet for more than 15 minutes) in the last 10 days? (You may have more people to list than the space provided. If so, write on the  front of this sheet or a separate piece of paper.) °Name ______________________________________________ °· Phone number ____________________________________ °· Date you last saw them _____________________________ °· Where you last saw them ________________________________________________ °Name ______________________________________________ °· Phone number ____________________________________ °· Date you last saw them _____________________________ °· Where you last saw them ________________________________________________ °Name ______________________________________________ °· Phone number ____________________________________ °· Date you last saw them _____________________________ °· Where you last saw them ________________________________________________ °Name ______________________________________________ °· Phone number ____________________________________ °· Date you last saw them _____________________________ °· Where you last saw them ________________________________________________ °Name ______________________________________________ °· Phone number ____________________________________ °· Date you last saw them _____________________________ °· Where you last saw them ________________________________________________ °What have you done in the last 10 days with other people? °Activity _____________________________________________ °· Location _________________________________________ °· Date ____________________________________________ °Activity _____________________________________________ °· Location _________________________________________ °· Date ____________________________________________ °Activity _____________________________________________ °· Location _________________________________________ °· Date ____________________________________________ °Activity _____________________________________________ °· Location _________________________________________ °· Date  ____________________________________________ °Activity _____________________________________________ °· Location _________________________________________ °· Date ____________________________________________ °cdc.gov/coronavirus °01/16/2019 °This information is not intended to replace advice given to you by your health care provider. Make sure you discuss any questions you have with your health care provider. °Document Revised: 05/25/2019 Document Reviewed: 05/25/2019 °Elsevier Patient Education © 2020 [employer] Inc. ° °

## 2020-02-07 ENCOUNTER — Telehealth: Payer: Self-pay | Admitting: Family Medicine

## 2020-02-07 NOTE — Telephone Encounter (Signed)
Results not back yet. Pt notified. She needs work note extended. ( send to my chart) please call after sending work note per pt. States symptoms are all in head. No chest pain, no sob, no fever.

## 2020-02-07 NOTE — Telephone Encounter (Signed)
Pt was tested Monday here for Covid no results yet Pt was only written out of work for two days and still feels bad and was told not to go back to work till Systems [employer]. She will need another letter.   Pt call back [phone]

## 2020-02-08 LAB — SPECIMEN STATUS REPORT

## 2020-02-08 LAB — NOVEL CORONAVIRUS, NAA: SARS-CoV-2, NAA: DETECTED — AB

## 2020-02-08 NOTE — Telephone Encounter (Signed)
See lab result

## 2020-02-09 ENCOUNTER — Other Ambulatory Visit (HOSPITAL_COMMUNITY): Payer: Self-pay | Admitting: Family

## 2020-02-09 DIAGNOSIS — Q8901 Asplenia (congenital): Secondary | ICD-10-CM

## 2020-02-09 DIAGNOSIS — U071 COVID-19: Secondary | ICD-10-CM

## 2020-02-09 NOTE — Progress Notes (Signed)
  I connected by phone with [name] on 02/09/2020 at 9:08 PM to discuss the potential use of a new treatment for mild to moderate COVID-19 viral infection in non-hospitalized patients.  This patient is a 49 y.o. female that meets the FDA criteria for Emergency Use Authorization of COVID monoclonal antibody casirivimab/imdevimab.  Has a (+) direct SARS-CoV-2 viral test result  Has mild or moderate COVID-19   Is NOT hospitalized due to COVID-19  Is within 10 days of symptom onset  Has at least one of the high risk factor(s) for progression to severe COVID-19 and/or hospitalization as defined in EUA.  Specific high risk criteria : Immunosuppressive Disease or Treatment The date of onset of symptoms was 02/05/2020  I have spoken and communicated the following to the patient or parent/caregiver regarding COVID monoclonal antibody treatment:  1. FDA has authorized the emergency use for the treatment of mild to moderate COVID-19 in adults and pediatric patients with positive results of direct SARS-CoV-2 viral testing who are 88 years of age and older weighing at least 40 kg, and who are at high risk for progressing to severe COVID-19 and/or hospitalization.  2. The significant known and potential risks and benefits of COVID monoclonal antibody, and the extent to which such potential risks and benefits are unknown.  3. Information on available alternative treatments and the risks and benefits of those alternatives, including clinical trials.  4. Patients treated with COVID monoclonal antibody should continue to self-isolate and use infection control measures (e.g., wear mask, isolate, social distance, avoid sharing personal items, clean and disinfect "high touch" surfaces, and frequent handwashing) according to CDC guidelines.   5. The patient or parent/caregiver has the option to accept or refuse COVID monoclonal antibody treatment.  After reviewing this information with the patient, The  patient agreed to proceed with receiving casirivimab\imdevimab infusion and will be provided a copy of the Fact sheet prior to receiving the infusion. [employer] 02/09/2020 9:08 PM

## 2020-02-11 ENCOUNTER — Ambulatory Visit (HOSPITAL_COMMUNITY)
Admission: RE | Admit: 2020-02-11 | Discharge: 2020-02-11 | Disposition: A | Discharge status: Home / Self Care | Payer: BC Managed Care – PPO | Source: Ambulatory Visit | Attending: Pulmonary Disease | Admitting: Pulmonary Disease

## 2020-02-11 DIAGNOSIS — U071 COVID-19: Secondary | ICD-10-CM | POA: Insufficient documentation

## 2020-02-11 DIAGNOSIS — Q8901 Asplenia (congenital): Secondary | ICD-10-CM

## 2020-02-11 MED ORDER — SODIUM CHLORIDE 0.9 % IV SOLN
1200.0000 mg | Freq: Once | INTRAVENOUS | Status: AC
  Administered 2020-02-11: 1200 mg via INTRAVENOUS
  Filled 2020-02-11: qty 10

## 2020-02-11 MED ORDER — DIPHENHYDRAMINE HCL 50 MG/ML IJ SOLN: 50.0000 mg | Freq: Once | INTRAMUSCULAR | Status: DC | PRN

## 2020-02-11 MED ORDER — SODIUM CHLORIDE 0.9 % IV SOLN: INTRAVENOUS | Status: DC | PRN

## 2020-02-11 MED ORDER — EPINEPHRINE 0.3 MG/0.3ML IJ SOAJ: 0.3000 mg | Freq: Once | INTRAMUSCULAR | Status: DC | PRN

## 2020-02-11 MED ORDER — ALBUTEROL SULFATE HFA 108 (90 BASE) MCG/ACT IN AERS: 2.0000 | INHALATION_SPRAY | Freq: Once | RESPIRATORY_TRACT | Status: DC | PRN

## 2020-02-11 MED ORDER — FAMOTIDINE IN NACL 20-0.9 MG/50ML-% IV SOLN: 20.0000 mg | Freq: Once | INTRAVENOUS | Status: DC | PRN

## 2020-02-11 NOTE — Progress Notes (Signed)
  Diagnosis: COVID-19  Physician: Laurann Montana NP  Procedure: [facility] Infusion [facility] Med: casirivimab\imdevimab infusion - Provided patient with casirivimab\imdevimab fact sheet for patients, parents and caregivers prior to infusion.  Complications: No immediate complications noted.  Discharge: Discharged home   Peggy Franklin 02/11/2020

## 2020-02-11 NOTE — Discharge Instructions (Signed)
10 Things You Can Do to Manage Your COVID-19 Symptoms at Home If you have possible or confirmed COVID-19: 1. Stay home from work and school. And stay away from other public places. If you must go out, avoid using any kind of public transportation, ridesharing, or taxis. 2. Monitor your symptoms carefully. If your symptoms get worse, call your healthcare provider immediately. 3. Get rest and stay hydrated. 4. If you have a medical appointment, call the healthcare provider ahead of time and tell them that you have or may have COVID-19. 5. For medical emergencies, call 911 and notify the dispatch personnel that you have or may have COVID-19. 6. Cover your cough and sneezes with a tissue or use the inside of your elbow. 7. Wash your hands often with soap and water for at least 20 seconds or clean your hands with an alcohol-based hand sanitizer that contains at least 60% alcohol. 8. As much as possible, stay in a specific room and away from other people in your home. Also, you should use a separate bathroom, if available. If you need to be around other people in or outside of the home, wear a mask. 9. Avoid sharing personal items with other people in your household, like dishes, towels, and bedding. 10. Clean all surfaces that are touched often, like counters, tabletops, and doorknobs. Use household cleaning sprays or wipes according to the label instructions. [email or web address] 12/21/2018 What types of side effects do monoclonal antibody drugs cause?  Common side effects  In general, the more common side effects caused by monoclonal antibody drugs include: . Allergic reactions, such as hives or itching . Flu-like signs and symptoms, including chills, fatigue, fever, and muscle aches and pains . Nausea, vomiting . Diarrhea . Skin rashes . Low blood pressure   The CDC is recommending patients who receive monoclonal antibody treatments wait at least 90 days before being vaccinated.  Currently,  there are no data on the safety and efficacy of mRNA COVID-19 vaccines in persons who received monoclonal antibodies or convalescent plasma as part of COVID-19 treatment. Based on the estimated half-life of such therapies as well as evidence suggesting that reinfection is uncommon in the 90 days after initial infection, vaccination should be deferred for at least 90 days, as a precautionary measure until additional information becomes available, to avoid interference of the antibody treatment with vaccine-induced immune responses.

## 2020-02-16 ENCOUNTER — Other Ambulatory Visit: Payer: Self-pay | Admitting: Family Medicine

## 2020-02-17 NOTE — Telephone Encounter (Signed)
May have 90 days with 1 refill does need follow-up within 6 months

## 2020-02-22 ENCOUNTER — Telehealth: Payer: Self-pay | Admitting: Family Medicine

## 2020-02-22 NOTE — Telephone Encounter (Signed)
Patient called needing corrections on FMLA because [employer] needing days for number 5 its highlighted. Mom states patient is not having surgery at this time but they are wanting days add if she needs to be out of work with patient . Its needs to be 1 to 7 days and 12 to 24 hours also. Form is in your box to redo . Please advise

## 2020-02-23 NOTE — Telephone Encounter (Signed)
Form has been faxed back over with corrections.

## 2020-03-12 ENCOUNTER — Telehealth: Payer: Self-pay | Admitting: Family Medicine

## 2020-03-12 NOTE — Telephone Encounter (Signed)
Pt had covid 8/16-8/26. 30th went back to work and now feels like she is starting to have signs of bronchitis with the weather change. Sunday she started with a runny nose and bad cough. She called the nurse line Sunday and they advised her to go to urgent care. She doesn't want to go to urgent care and would like to know if something can be called in. She has tried OTC cough medicine.

## 2020-03-12 NOTE — Telephone Encounter (Signed)
lvm pt needs to schedule appt. We are booked today and to try and call at 8:30 tomorrow.

## 2020-03-12 NOTE — Telephone Encounter (Signed)
Pt will need to be seen in order for anything to be called in. Please contact pt to have her set up appt. Thank you!

## 2020-03-15 ENCOUNTER — Telehealth (INDEPENDENT_AMBULATORY_CARE_PROVIDER_SITE_OTHER): Payer: BC Managed Care – PPO | Admitting: Nurse Practitioner

## 2020-03-15 ENCOUNTER — Other Ambulatory Visit: Payer: Self-pay

## 2020-03-15 DIAGNOSIS — J208 Acute bronchitis due to other specified organisms: Secondary | ICD-10-CM | POA: Diagnosis not present

## 2020-03-15 DIAGNOSIS — B9689 Other specified bacterial agents as the cause of diseases classified elsewhere: Secondary | ICD-10-CM

## 2020-03-15 NOTE — Progress Notes (Signed)
   Subjective:    Patient ID: Peggy Franklin, female    DOB: July 27, 1970, 49 y.o.   MRN: [id number]  Cough This is a new problem. The current episode started in the past 7 days. Associated symptoms include headaches and nasal congestion.   Patient states she had Covid in August and got better but started feeling bad again last Friday  Review of Systems  Respiratory: Positive for cough.   Neurological: Positive for headaches.       Objective:   Physical Exam        Assessment & Plan:   Virtual Visit via Telephone Note  I connected with Peggy Franklin on 03/16/20 at  3:20 PM EDT by telephone and verified that I am speaking with the correct person using two identifiers.  Location: Patient: home Provider: office   I discussed the limitations, risks, security and privacy concerns of performing an evaluation and management service by telephone and the availability of in person appointments. I also discussed with the patient that there may be a patient responsible charge related to this service. The patient expressed understanding and agreed to proceed.   History of Present Illness: Presents for c/o cough over the past week. Was diagnosed with COVID on 8/16. Continues to have bad headaches occasionally after this. Her last one was on 9/10 which caused her to miss work. Otherwise, she has been working her regular schedule.  No fever, SOB or wheezing. No runny nose. Cough worse at work when she gets hot.  Began having CP only with coughing 2 days ago. This is much improved.  Sense of taste and smell is fine at this time.  No sore throat or ear pain. Taking fluids well. Voiding nl.  Continues to smoke 1/2 ppd.    Observations/Objective: Today's visit was via telephone Physical exam was not possible for this visit Alert, oriented. Occasional cough noted. No audible wheezing.   Assessment and Plan: Acute bacterial bronchitis  Meds ordered this encounter  Medications  .  azithromycin (ZITHROMAX Z-PAK) 250 MG tablet    Sig: Take 2 tablets (500 mg) on  Day 1,  followed by 1 tablet (250 mg) once daily on Days 2 through 5.    Dispense:  6 each    Refill:  0    Diagnosis: acute bacterial bronchitis     Follow Up Instructions: OTC meds as directed for cough and congestion.  Warning signs reviewed. Call next week if no improvement.  Go to ED or urgent care this weekend if worse.  Discussed smoking cessation. Recommend wellness exam.    I discussed the assessment and treatment plan with the patient. The patient was provided an opportunity to ask questions and all were answered. The patient agreed with the plan and demonstrated an understanding of the instructions.   The patient was advised to call back or seek an in-person evaluation if the symptoms worsen or if the condition fails to improve as anticipated.  I provided 15 minutes of non-face-to-face time during this encounter.   Nilda Simmer, NP

## 2020-03-16 ENCOUNTER — Encounter: Payer: Self-pay | Admitting: Nurse Practitioner

## 2020-03-16 MED ORDER — AZITHROMYCIN 250 MG PO TABS: ORAL_TABLET | ORAL | 0 refills | Status: DC

## 2020-03-18 ENCOUNTER — Telehealth: Payer: Self-pay

## 2020-03-18 NOTE — Telephone Encounter (Signed)
Patient send over leave claim to be completed. She states spoke with you on this at her appointment on 9/24. Patient form is in your box to be completed needing it faxed by 10/4 if possible.

## 2020-03-22 NOTE — Telephone Encounter (Signed)
Please let her know I completed her form and gave it to Rembrandt at the front desk. Thanks.

## 2020-03-22 NOTE — Telephone Encounter (Signed)
Form is ready to be faxed but must have payment first.

## 2020-03-25 DIAGNOSIS — Z029 Encounter for administrative examinations, unspecified: Secondary | ICD-10-CM

## 2020-04-04 DIAGNOSIS — T148XXA Other injury of unspecified body region, initial encounter: Secondary | ICD-10-CM | POA: Diagnosis not present

## 2020-04-04 DIAGNOSIS — M71571 Other bursitis, not elsewhere classified, right ankle and foot: Secondary | ICD-10-CM | POA: Diagnosis not present

## 2020-04-22 ENCOUNTER — Other Ambulatory Visit: Payer: Self-pay

## 2020-04-22 ENCOUNTER — Encounter: Payer: Self-pay | Admitting: Podiatry

## 2020-04-22 ENCOUNTER — Ambulatory Visit (INDEPENDENT_AMBULATORY_CARE_PROVIDER_SITE_OTHER): Payer: BC Managed Care – PPO

## 2020-04-22 ENCOUNTER — Ambulatory Visit: Payer: BC Managed Care – PPO | Admitting: Podiatry

## 2020-04-22 DIAGNOSIS — M898X9 Other specified disorders of bone, unspecified site: Secondary | ICD-10-CM | POA: Diagnosis not present

## 2020-04-22 DIAGNOSIS — L989 Disorder of the skin and subcutaneous tissue, unspecified: Secondary | ICD-10-CM | POA: Diagnosis not present

## 2020-04-22 NOTE — Progress Notes (Signed)
° °  Subjective: 49 y.o. female presenting to the office today as a referral from Dr. Gershon Mussel, local podiatrist, for evaluation of pain to the lateral aspect of the right foot.  The patient has a skin lesion over the lateral aspect of the right foot.  She has gone through multiple physicians and different treatment modalities without any relief.  She also had an MRI performed June 2021.  She presents today for further treatment evaluation   Past Medical History:  Diagnosis Date   History of asplenia 07/10/2019     Objective:  Physical Exam General: Alert and oriented x3 in no acute distress  Dermatology: Hyperkeratotic lesion(s) present on the lateral aspect of the right foot fifth metatarsal tubercle area. Pain on palpation with a central nucleated core noted. Skin is warm, dry and supple bilateral lower extremities. Negative for open lesions or macerations.  Vascular: Palpable pedal pulses bilaterally. No edema or erythema noted. Capillary refill within normal limits.  Neurological: Epicritic and protective threshold grossly intact bilaterally.   Musculoskeletal Exam: Pain on palpation at the keratotic lesion(s) noted. Range of motion within normal limits bilateral. Muscle strength 5/5 in all groups bilateral.  Radiographic exam: Normal osseous mineralization.  Joint spaces preserved.  No fracture identified.  No significant spurring noted.  Assessment: 1.  Symptomatic porokeratosis right-chronic   Plan of Care:  1. Patient evaluated 2. Today we discussed the conservative versus surgical management of the presenting pathology. The patient opts for surgical management. All possible complications and details of the procedure were explained. All patient questions were answered. No guarantees were expressed or implied. 3. Authorization for surgery was initiated today. Surgery will consist of excision of benign skin lesion right lateral foot with primary closure performed here in the  office 4.  Return to clinic morning of in office procedure  *Works 12-hour shifts on her feet all day    Edrick Kins, DPM [facility]  Dr. Edrick Kins, [place]                                        [place], [place] [address]                Office [phone]  Fax [phone]

## 2020-04-29 ENCOUNTER — Ambulatory Visit: Payer: BC Managed Care – PPO | Admitting: Podiatry

## 2020-04-29 ENCOUNTER — Other Ambulatory Visit: Payer: Self-pay

## 2020-04-29 DIAGNOSIS — L989 Disorder of the skin and subcutaneous tissue, unspecified: Secondary | ICD-10-CM

## 2020-04-29 DIAGNOSIS — M79676 Pain in unspecified toe(s): Secondary | ICD-10-CM

## 2020-04-29 DIAGNOSIS — M79671 Pain in right foot: Secondary | ICD-10-CM

## 2020-04-29 MED ORDER — HYDROCODONE-ACETAMINOPHEN 10-325 MG PO TABS
1.0000 | ORAL_TABLET | ORAL | 0 refills | Status: AC | PRN
Start: 2020-04-29 — End: 2020-05-04

## 2020-04-29 NOTE — Progress Notes (Signed)
   OPERATIVE REPORT Patient name: Peggy Franklin MRN: [id number] DOB: 1971/04/03  DOS:  04/29/20  Preop Dx: symptomatic benign skin lesion right lateral foot Postop Dx: same  Procedure:  1. Excision benign skin lesion right lateral foot  Surgeon: Edrick Kins DPM  Anesthesia: 50-50 mixture of 2% lidocaine with epinephrine totaling 6 mL infiltrated in the patient's right foot  Hemostasis: None  EBL: Minimal mL Materials: None Injectables: None Pathology: None  Condition: The patient tolerated the procedure and anesthesia well. No complications noted or reported   Justification for procedure: The patient is a 49 y.o. female who presents today for surgical correction of symptomatic skin lesion overlying the fifth metatarsal tubercle of the right foot that is failed all conservative treatment modalities.. All conservative modalities of been unsuccessful in providing any sort of satisfactory alleviation of symptoms with the patient. The patient was told benefits as well as possible side effects of the surgery. The patient consented for surgical correction. The patient consent form was reviewed. All patient questions were answered. No guarantees were expressed or implied.   Procedure in Detail: The patient was brought to the procedure room, placed in the procedure chair in the supine position at which time an aseptic scrub and drape were performed about the patient's respective lower extremity after anesthesia was induced as described above. Attention was then directed to the surgical area where procedure number one commenced.  Procedure #1: Excision of benign skin lesion right lateral foot  An elliptical type incision was planned and made surrounding the benign skin lesion that was symptomatic.  The elipsed skin wedge was removed in its entirety which completely included the symptomatic skin lesion.  Visualization of the site indicated that the lesion was completely removed.  Incision was  carried down to the level subcutaneous tissue.  Care was taken to avoid any vital structures around the area.  Irrigation was utilized and primary closure was obtained using nylon suture.  Dry sterile compressive dressings were then applied to all previously mentioned incision sites about the patient's lower extremity. The tourniquet which was used for hemostasis was deflated. All normal neurovascular responses including pink color and warmth returned all the digits of patient's lower extremity.  The patient was then discharged from the office with adequate prescriptions for analgesia. Verbal as well as written instructions were provided for the patient regarding wound care. The patient is to keep the dressings clean dry and intact until they are to follow surgeon Dr. Daylene Katayama in the office upon discharge in one week.   Edrick Kins, DPM [facility]  Dr. Edrick Kins, [place]                                        [place], [place] [address]                Office [phone]  Fax [phone]

## 2020-04-30 ENCOUNTER — Telehealth: Payer: Self-pay | Admitting: Podiatry

## 2020-04-30 DIAGNOSIS — Z9889 Other specified postprocedural states: Secondary | ICD-10-CM

## 2020-04-30 DIAGNOSIS — R112 Nausea with vomiting, unspecified: Secondary | ICD-10-CM

## 2020-04-30 MED ORDER — ONDANSETRON HCL 4 MG PO TABS: 4.0000 mg | ORAL_TABLET | Freq: Three times a day (TID) | ORAL | 0 refills | Status: DC | PRN

## 2020-04-30 NOTE — Telephone Encounter (Signed)
Pt had surgery yesterday and is having a bad reaction to the pain meds. She states that she has been experiencing extreme vomiting since 4am. Please advise.

## 2020-04-30 NOTE — Addendum Note (Signed)
Addended bySherryle Lis, Gal Smolinski R on: 04/30/2020 12:56 PM   Modules accepted: Orders

## 2020-04-30 NOTE — Telephone Encounter (Signed)
I tried to call to leave her a voicemail but was unable. I have sent a Rx for Zofran (anti nausea and vomiting medication) to her pharmacy. Most likely it is the narcotic pain medication. She should stop taking this, take 1,000mg  tylenol every 6 hours and 600mg  ibuprofen (motrin) every 6 hours to control the pain. If she would like a Rx for either of those I can send her one

## 2020-05-02 ENCOUNTER — Telehealth: Payer: Self-pay | Admitting: Podiatry

## 2020-05-02 NOTE — Telephone Encounter (Signed)
Patient called in stating she got bandages wet last night in shower but stitches are dry, patient is a bit concerned, Please advise

## 2020-05-06 ENCOUNTER — Other Ambulatory Visit: Payer: Self-pay

## 2020-05-06 ENCOUNTER — Ambulatory Visit (INDEPENDENT_AMBULATORY_CARE_PROVIDER_SITE_OTHER): Payer: BC Managed Care – PPO | Admitting: Podiatry

## 2020-05-06 DIAGNOSIS — R112 Nausea with vomiting, unspecified: Secondary | ICD-10-CM

## 2020-05-06 DIAGNOSIS — L989 Disorder of the skin and subcutaneous tissue, unspecified: Secondary | ICD-10-CM

## 2020-05-06 DIAGNOSIS — Z9889 Other specified postprocedural states: Secondary | ICD-10-CM

## 2020-05-06 NOTE — Progress Notes (Signed)
   Subjective:  Patient presents today status post excision of benign skin lesion right foot. DOS: 04/29/2020 performed here in the office.  Patient doing well.  She did have some nausea and vomiting from the pain medication that was prescribed.  She is since just been taking Advil.  No new complaints at this time.  Past Medical History:  Diagnosis Date  . History of asplenia 07/10/2019      Objective/Physical Exam Neurovascular status intact.  Skin incisions appear to be well coapted with sutures intact. No sign of infectious process noted. No dehiscence. No active bleeding noted.  Negative for any significant edema noted to the surgical extremity.   Assessment: 1. s/p excision of benign skin lesion right foot. DOS: 04/29/2020   Plan of Care:  1. Patient was evaluated.  2.  Recommend antibiotic ointment and a Band-Aid daily. 3.  Patient may transition out of the postsurgical shoe 4.  Return to clinic in 2 weeks for suture removal 5.  Note for work was provided today.  Note stating: Patient status post right foot surgery.  DOS: 04/29/2020.  Excision of symptomatic skin lesion secondary to likely fracture July 2019.  Patient diagnosed with chronic fracture right fifth metatarsal 04/04/2020 by another physician.  Skin lesion developed after injury and may have been caused secondary to compensation and altered gait pattern secondary to the fracture.   Edrick Kins, DPM [facility]  Dr. Edrick Kins, DPM    [address], [place] [address]                Office [phone]  Fax [phone]

## 2020-05-13 ENCOUNTER — Encounter: Payer: BC Managed Care – PPO | Admitting: Podiatry

## 2020-05-20 ENCOUNTER — Other Ambulatory Visit: Payer: Self-pay

## 2020-05-20 ENCOUNTER — Ambulatory Visit (INDEPENDENT_AMBULATORY_CARE_PROVIDER_SITE_OTHER): Payer: BC Managed Care – PPO | Admitting: Podiatry

## 2020-05-20 DIAGNOSIS — Z9889 Other specified postprocedural states: Secondary | ICD-10-CM

## 2020-05-20 DIAGNOSIS — L989 Disorder of the skin and subcutaneous tissue, unspecified: Secondary | ICD-10-CM

## 2020-05-20 MED ORDER — DOXYCYCLINE HYCLATE 100 MG PO TABS: 100.0000 mg | ORAL_TABLET | Freq: Two times a day (BID) | ORAL | 0 refills | Status: DC

## 2020-05-20 NOTE — Progress Notes (Signed)
   Subjective:  Patient presents today status post excision of benign skin lesion right foot. DOS: 04/29/2020 performed here in the office.  Patient doing well.  No new complaints at this time  Past Medical History:  Diagnosis Date  . History of asplenia 07/10/2019      Objective/Physical Exam Neurovascular status intact.  Skin incisions appear to be well coapted with sutures intact.  There is some very slight drainage to the proximal portion of the incision site.  Serous drainage.  No malodor.  No dehiscence. No active bleeding noted.  Negative for any significant edema noted to the surgical extremity.   Assessment: 1. s/p excision of benign skin lesion right foot. DOS: 04/29/2020   Plan of Care:  1. Patient was evaluated.  Sutures removed today 2.  Recommend antibiotic ointment and a Band-Aid daily. 3.  Continue wearing good supportive shoes 4.  Prescription for doxycycline 100 mg 2 times daily #20.  There was some slight drainage to the proximal portion of the incision site.   5.  Return to clinic in 1 week.  At this time we will discussed the patient returning back to work.  The patient states that she has not returned to work yet.  Edrick Kins, DPM [facility]  Dr. Edrick Kins, DPM    [address], [place] [address]                Office [phone]  Fax [phone]

## 2020-05-21 DIAGNOSIS — M79676 Pain in unspecified toe(s): Secondary | ICD-10-CM

## 2020-05-27 ENCOUNTER — Encounter: Payer: Self-pay | Admitting: Podiatry

## 2020-05-27 ENCOUNTER — Ambulatory Visit (INDEPENDENT_AMBULATORY_CARE_PROVIDER_SITE_OTHER): Payer: BC Managed Care – PPO | Admitting: Podiatry

## 2020-05-27 ENCOUNTER — Other Ambulatory Visit: Payer: Self-pay

## 2020-05-27 DIAGNOSIS — L989 Disorder of the skin and subcutaneous tissue, unspecified: Secondary | ICD-10-CM

## 2020-05-27 DIAGNOSIS — Z9889 Other specified postprocedural states: Secondary | ICD-10-CM

## 2020-05-28 NOTE — Progress Notes (Signed)
   Subjective:  Patient presents today status post excision of benign skin lesion right foot. DOS: 04/29/2020 performed here in the office.  Patient doing well.  No new complaints at this time  Past Medical History:  Diagnosis Date  . History of asplenia 07/10/2019      Objective/Physical Exam Neurovascular status intact.  Skin incisions appear to be well coapted and healed.  Complete reepithelialization has occurred.  Negative for any drainage today.  No malodor.  No dehiscence. No active bleeding noted.  Negative for any significant edema noted to the surgical extremity. There is some very slight sensitivity around the incision site.   Assessment: 1. s/p excision of benign skin lesion right foot. DOS: 04/29/2020   Plan of Care:  1. Patient was evaluated.   2.  Patient may now resume full activity no restrictions.  Slowly increase activity and good supportive sneakers and shoes 3.  Patient scheduled to return to work tomorrow, 03/28/2020.  Office note provided 4.  Return to clinic as needed  Edrick Kins, DPM [facility]  Dr. Edrick Kins, DPM    [address], [place] [address]                Office [phone]  Fax [phone]

## 2020-05-30 ENCOUNTER — Other Ambulatory Visit: Payer: Self-pay

## 2020-05-30 ENCOUNTER — Telehealth (INDEPENDENT_AMBULATORY_CARE_PROVIDER_SITE_OTHER): Payer: BC Managed Care – PPO | Admitting: Family Medicine

## 2020-05-30 ENCOUNTER — Encounter: Payer: Self-pay | Admitting: Family Medicine

## 2020-05-30 DIAGNOSIS — R197 Diarrhea, unspecified: Secondary | ICD-10-CM | POA: Diagnosis not present

## 2020-05-30 DIAGNOSIS — R112 Nausea with vomiting, unspecified: Secondary | ICD-10-CM

## 2020-05-30 MED ORDER — ONDANSETRON 4 MG PO TBDP: 4.0000 mg | ORAL_TABLET | Freq: Three times a day (TID) | ORAL | 0 refills | Status: DC | PRN

## 2020-05-30 NOTE — Progress Notes (Signed)
Patient ID: Peggy Franklin, female    DOB: [birth year], 49 y.o.   MRN: [id number]   Chief Complaint  Patient presents with  . Vomiting  . Abdominal Pain   Subjective:  CC: vomiting and diarrhea. Last vomited at 0100 this morning  This is a new problem.  Has had recent surgery, released to go back to work on Tuesday and then at work started having vomiting, abdominal pain and diarrhea.  Was on recent antibiotic therapy.  Has not taken doxycycline since Monday.  Has also had nausea and vomiting, able to keep fluids up she says she is drinking constantly and staying hydrated.  Denies fever, chills, chest pain shortness of breath.  Major concern for C. difficile with recent antibiotic therapy.    Emesis  This is a new problem. Episode onset: Tuesday. There has been no fever. Associated symptoms include abdominal pain and diarrhea. Pertinent negatives include no chest pain, chills, coughing or fever. Associated symptoms comments: Patient started feeling nauseous and extremely bloated Tuesday and ended with vomiting and diarrhea. Has not not vomited since 1:00 this morning but is having frequent diarrhea. Patient has not been able to eat anything since Tuesday, sipping ginger ale and other fluids.   Patient had foot surgery 1 month ago and was given an abx last week due to post surgery blister. Patient has not taken doxycycline since Tuesday due to not being able to keep things down. .   Virtual Visit via Video Note  I connected with [name] on 05/30/20 at 11:20 AM EST by a video enabled telemedicine application and verified that I am speaking with the correct person using two identifiers.  Location: Patient: home Provider: office    I discussed the limitations of evaluation and management by telemedicine and the availability of in person appointments. The patient expressed understanding and agreed to proceed.  History of Present Illness:    Observations/Objective:   Assessment  and Plan:   Follow Up Instructions:    I discussed the assessment and treatment plan with the patient. The patient was provided an opportunity to ask questions and all were answered. The patient agreed with the plan and demonstrated an understanding of the instructions.   The patient was advised to call back or seek an in-person evaluation if the symptoms worsen or if the condition fails to improve as anticipated.  I provided 12 minutes of non-face-to-face time during this encounter.     Medical History Peggy Franklin has a past medical history of History of asplenia (07/10/2019).   Outpatient Encounter Medications as of 05/30/2020  Medication Sig  . doxycycline (VIBRA-TABS) 100 MG tablet Take 1 tablet (100 mg total) by mouth 2 (two) times daily.  . ondansetron (ZOFRAN ODT) 4 MG disintegrating tablet Take 1 tablet (4 mg total) by mouth every 8 (eight) hours as needed for nausea or vomiting.  . pantoprazole (PROTONIX) 40 MG tablet TAKE 1 TABLET(40 MG) BY MOUTH DAILY  . [DISCONTINUED] azithromycin (ZITHROMAX Z-PAK) 250 MG tablet Take 2 tablets (500 mg) on  Day 1,  followed by 1 tablet (250 mg) once daily on Days 2 through 5.  . [DISCONTINUED] ondansetron (ZOFRAN) 4 MG tablet Take 1 tablet (4 mg total) by mouth every 8 (eight) hours as needed for nausea or vomiting.   No facility-administered encounter medications on file as of 05/30/2020.     Review of Systems  Constitutional: Negative for chills and fever.  Respiratory: Negative for cough and shortness of breath.  Cardiovascular: Negative for chest pain.  Gastrointestinal: Positive for abdominal pain, diarrhea and vomiting.  Genitourinary: Negative for dysuria.     Vitals There were no vitals taken for this visit.  Objective:   Physical Exam  Unable to preform Assessment and Plan   1. Diarrhea, unspecified type - Clostridium Difficile by PCR  2. Nausea and vomiting, intractability of vomiting not specified, unspecified  vomiting type - ondansetron (ZOFRAN ODT) 4 MG disintegrating tablet; Take 1 tablet (4 mg total) by mouth every 8 (eight) hours as needed for nausea or vomiting.  Dispense: 20 tablet; Refill: 0   Concerned for C. difficile due to recent antibiotic therapy.  She will come by the office, we will take stool sample container and supplies to her car.  She will return the stool sample to the [facility]. Instructions given.   She wishes for something to treat her nausea and vomiting, Zofran sent.  She reports that she is able to stay adequately hydrated, drinking constantly, instructions given concerning dehydration and the need to stay hydrated.  Agrees with plan of care discussed today. Understands warning signs to seek further care: Chest pain, shortness of breath, dehydration, any significant change in health. Understands to stay hydrated, we will follow-up once we have the stool sample results available.  If she should worsen she should seek attention at the emergency department.  Peggy Ades, FNP-C

## 2020-05-31 ENCOUNTER — Telehealth: Payer: Self-pay

## 2020-05-31 NOTE — Telephone Encounter (Signed)
Pt called not having as many issue still bloated feels real soar and cramps and still fell full and nausea . Pt took Zofran last night.   Pt call back [phone]

## 2020-06-17 ENCOUNTER — Encounter: Payer: BC Managed Care – PPO | Admitting: Podiatry

## 2020-07-04 DIAGNOSIS — M79676 Pain in unspecified toe(s): Secondary | ICD-10-CM

## 2020-07-10 ENCOUNTER — Encounter: Payer: Self-pay | Admitting: Family Medicine

## 2020-07-10 ENCOUNTER — Telehealth: Payer: Self-pay

## 2020-07-10 ENCOUNTER — Other Ambulatory Visit: Payer: Self-pay

## 2020-07-10 ENCOUNTER — Ambulatory Visit (INDEPENDENT_AMBULATORY_CARE_PROVIDER_SITE_OTHER): Payer: BC Managed Care – PPO | Admitting: Family Medicine

## 2020-07-10 VITALS — HR 92 | Temp 97.0°F | Resp 16

## 2020-07-10 DIAGNOSIS — R059 Cough, unspecified: Secondary | ICD-10-CM | POA: Diagnosis not present

## 2020-07-10 DIAGNOSIS — Z20822 Contact with and (suspected) exposure to covid-19: Secondary | ICD-10-CM | POA: Diagnosis not present

## 2020-07-10 MED ORDER — ALBUTEROL SULFATE HFA 108 (90 BASE) MCG/ACT IN AERS: 2.0000 | INHALATION_SPRAY | Freq: Four times a day (QID) | RESPIRATORY_TRACT | 0 refills | Status: DC | PRN

## 2020-07-10 MED ORDER — BENZONATATE 100 MG PO CAPS: 100.0000 mg | ORAL_CAPSULE | Freq: Two times a day (BID) | ORAL | 0 refills | Status: DC | PRN

## 2020-07-10 NOTE — Telephone Encounter (Signed)
Error

## 2020-07-10 NOTE — Progress Notes (Signed)
Patient presents today with respiratory illness Number of days present- 3 days   Symptoms include- cough, right side of throat feels ticklish   Presence of worrisome signs (severe shortness of breath, lethargy, etc.) - no  Recent/current visit to urgent care or ER- no  Recent direct exposure to Covid- fiance tested positive on Saturday  Any current Covid testing-none    Patient ID: Peggy Franklin, female    DOB: 04-06-1971, 50 y.o.   MRN: [id number]   Chief Complaint  Patient presents with  . Cough   Subjective:  CC: cough and right side throat ticklish  This is a new problem.  Presents today for an acute visit with a complaint of cough, right side throat ticklish.  Symptoms have been present for 3 days, associated symptoms include headache.  Pertinent negatives include no fever, no chills, no congestion, no ear pain, no sinus pain or pressure, no abdominal pain, no nausea, no vomiting or diarrhea.  Has tried Mucinex which has not helped.  Does report that she had COVID infection in August, and received infusion at that time.  Wishes to have her cough treated today.  She is unable to get her booster vaccination until 6 to 9 months have passed from her antibody infusion.  Ports that her fianc tested positive for COVID this past Saturday.    Medical History Peggy Franklin has a past medical history of History of asplenia (07/10/2019).   Outpatient Encounter Medications as of 07/10/2020  Medication Sig  . albuterol (VENTOLIN HFA) 108 (90 Base) MCG/ACT inhaler Inhale 2 puffs into the lungs every 6 (six) hours as needed for wheezing or shortness of breath.  . benzonatate (TESSALON) 100 MG capsule Take 1 capsule (100 mg total) by mouth 2 (two) times daily as needed for cough.  . pantoprazole (PROTONIX) 40 MG tablet TAKE 1 TABLET(40 MG) BY MOUTH DAILY  . [DISCONTINUED] doxycycline (VIBRA-TABS) 100 MG tablet Take 1 tablet (100 mg total) by mouth 2 (two) times daily.  . [DISCONTINUED] ondansetron  (ZOFRAN ODT) 4 MG disintegrating tablet Take 1 tablet (4 mg total) by mouth every 8 (eight) hours as needed for nausea or vomiting.   No facility-administered encounter medications on file as of 07/10/2020.     Review of Systems  Constitutional: Negative for chills, fatigue and fever.  HENT: Positive for sore throat. Negative for congestion, ear pain, sinus pressure and sinus pain.   Respiratory: Positive for cough. Negative for shortness of breath.   Cardiovascular: Negative for chest pain.  Gastrointestinal: Negative for abdominal pain, diarrhea, nausea and vomiting.  Neurological: Positive for headaches.       Not since saturday     Vitals Pulse 92   Temp (!) 97 F (36.1 C)   Resp 16   SpO2 95%   Objective:   Physical Exam Vitals reviewed.  Constitutional:      General: She is not in acute distress.    Appearance: Normal appearance.  HENT:     Right Ear: Tympanic membrane normal.     Left Ear: Tympanic membrane normal.     Nose:     Right Turbinates: Swollen.     Left Turbinates: Swollen.     Right Sinus: No maxillary sinus tenderness or frontal sinus tenderness.     Left Sinus: No maxillary sinus tenderness or frontal sinus tenderness.     Mouth/Throat:     Pharynx: No oropharyngeal exudate or posterior oropharyngeal erythema.  Cardiovascular:     Rate and Rhythm: Normal  rate and regular rhythm.     Heart sounds: Normal heart sounds.  Pulmonary:     Effort: Pulmonary effort is normal.     Breath sounds: Normal breath sounds.  Skin:    General: Skin is warm and dry.  Neurological:     General: No focal deficit present.     Mental Status: She is alert.  Psychiatric:        Behavior: Behavior normal.      Assessment and Plan   1. Close exposure to 2019 novel coronavirus - benzonatate (TESSALON) 100 MG capsule; Take 1 capsule (100 mg total) by mouth 2 (two) times daily as needed for cough.  Dispense: 20 capsule; Refill: 0 - albuterol (VENTOLIN HFA) 108 (90  Base) MCG/ACT inhaler; Inhale 2 puffs into the lungs every 6 (six) hours as needed for wheezing or shortness of breath.  Dispense: 8 g; Refill: 0  2. Cough - benzonatate (TESSALON) 100 MG capsule; Take 1 capsule (100 mg total) by mouth 2 (two) times daily as needed for cough.  Dispense: 20 capsule; Refill: 0 - albuterol (VENTOLIN HFA) 108 (90 Base) MCG/ACT inhaler; Inhale 2 puffs into the lungs every 6 (six) hours as needed for wheezing or shortness of breath.  Dispense: 8 g; Refill: 0   Will treat cough with Tessalon Perles and albuterol inhaler.  Recommend supportive therapy, adequate hydration.  Agrees with plan of care discussed today. Understands warning signs to seek further care: chest pain, shortness of breath, any significant change in health.  Understands to follow-up if symptoms do not improve, or worsen.  COVID respiratory warning as stated below given.  Covid-19 warning:  Covid-19 is a virus that causes hypoxia (low oxygen level in blood) in some people. If you develop any changes in your usual breathing pattern: difficulty catching your breath, more short winded with activity or with resting, or anything that concerns you about your breathing, do not hesitate to go to the emergency department immediately for evaluation. Covid infection can also affect the way the brain functions if it lacks oxygen, such as, feeling dizzy, passing out, or feeling confused, if you experience any of these symptoms, please do not delay to seek treatment.  Some people experience gastrointestinal problems with Covid, such as vomiting and diarrhea, dehydration is a serious risk and should be avoided. If you are unable to keep liquids down you may need to go to the emergency department for intravenous fluids to avoid dehydration.   Please alert and involve your family and/or friends to help keep an eye on you while you recover from Covid-19. If you have any questions or concerns about your recovery, please do  not hesitate to call the office for guidance.   Recommend supportive therapy while you are recovering:   1) Get lots of rest.  2) Take over the counter pain medication if needed, such as acetaminophen or ibuprofen. Read and follow instructions on the label and make sure not to combine other medications that may have same ingredients in it. It is important to not take too much of these ingredients.  3) Drink plenty of caffeine-free fluids. (If you have heart or kidney problems, follow the instructions of your specialist regarding amounts).  4) If you are hungry, eat a bland diet, such as the BRAT diet (bananas, rice, applesauce, toast).  5) Let us know if you are not feeling better in a week.  Covid-19 Quarantine Instructions:   You have tested positive for Covid-19 infection. The current  CDC guidelines for quarantine regardless of vaccination status are:   Please quarantine and isolate at home for a minimum of  5 days.   - If you have no symptoms or your symptoms are resolving after 5 days you   can leave the home (resolving means no shortness of breath, no fever, without taking fever reducing medication, no headache, etc). -Continue to wear a mask around others for an additional 5 days.  -If you were severely ill with Covid-19 you should isolate for at least 10 days.    Use over-the-counter medications for symptoms.If you develop respiratory issues/distress (see Covid warning), seek medical care in the Emergency Department.  If you must leave home or if you have to be around others please wear a mask. Please limit contact with immediate family members in the home, practice social distancing, frequent handwashing and clean hard surfaces touched frequently with household cleaning products. Members of your household will also need to quarantine for 5 days and test on day five if possible.  Covid-19 warning:  Covid-19 is a virus that causes hypoxia (low oxygen level in blood) in some  people. If you develop any changes in your usual breathing pattern: difficulty catching your breath, more short winded with activity or with resting, or anything that concerns you about your breathing, do not hesitate to go to the emergency department immediately for evaluation. Covid infection can also affect the way the brain functions if it lacks oxygen, such as, feeling dizzy, passing out, or feeling confused, if you experience any of these symptoms, please do not delay to seek treatment.  Some people experience gastrointestinal problems with Covid, such as vomiting and diarrhea, dehydration is a serious risk and should be avoided. If you are unable to keep liquids down you may need to go to the emergency department for intravenous fluids to avoid dehydration.   Please alert and involve your family and/or friends to help keep an eye on you while you recover from Covid-19. If you have any questions or concerns about your recovery, please do not hesitate to call the office for guidance.     Chalmers Guest, NP 07/10/2020

## 2020-07-10 NOTE — Patient Instructions (Signed)
Recommend supportive therapy while you are recovering:   1) Get lots of rest.  2) Take over the counter pain medication if needed, such as acetaminophen or ibuprofen. Read and follow instructions on the label and make sure not to combine other medications that may have same ingredients in it. It is important to not take too much of these ingredients.  3) Drink plenty of caffeine-free fluids. (If you have heart or kidney problems, follow the instructions of your specialist regarding amounts).  4) If you are hungry, eat a bland diet, such as the BRAT diet (bananas, rice, applesauce, toast).  5) Let us know if you are not feeling better in a week.  Covid-19 warning:  Covid-19 is a virus that causes hypoxia (low oxygen level in blood) in some people. If you develop any changes in your usual breathing pattern: difficulty catching your breath, more short winded with activity or with resting, or anything that concerns you about your breathing, do not hesitate to go to the emergency department immediately for evaluation. Covid infection can also affect the way the brain functions if it lacks oxygen, such as, feeling dizzy, passing out, or feeling confused, if you experience any of these symptoms, please do not delay to seek treatment.  Some people experience gastrointestinal problems with Covid, such as vomiting and diarrhea, dehydration is a serious risk and should be avoided. If you are unable to keep liquids down you may need to go to the emergency department for intravenous fluids to avoid dehydration.   Please alert and involve your family and/or friends to help keep an eye on you while you recover from Covid-19. If you have any questions or concerns about your recovery, please do not hesitate to call the office for guidance.    Covid-19 Quarantine Instructions:   You have tested positive for Covid-19 infection. The current CDC guidelines for quarantine regardless of vaccination status are:    Please quarantine and isolate at home for a minimum of  5 days.   - If you have no symptoms or your symptoms are resolving after 5 days you   can leave the home (resolving means no shortness of breath, no fever, without taking fever reducing medication, no headache, etc). -Continue to wear a mask around others for an additional 5 days.  -If you were severely ill with Covid-19 you should isolate for at least 10 days.    Use over-the-counter medications for symptoms.If you develop respiratory issues/distress (see Covid warning), seek medical care in the Emergency Department.  If you must leave home or if you have to be around others please wear a mask. Please limit contact with immediate family members in the home, practice social distancing, frequent handwashing and clean hard surfaces touched frequently with household cleaning products. Members of your household will also need to quarantine for 5 days and test on day five if possible.  Covid-19 warning:  Covid-19 is a virus that causes hypoxia (low oxygen level in blood) in some people. If you develop any changes in your usual breathing pattern: difficulty catching your breath, more short winded with activity or with resting, or anything that concerns you about your breathing, do not hesitate to go to the emergency department immediately for evaluation. Covid infection can also affect the way the brain functions if it lacks oxygen, such as, feeling dizzy, passing out, or feeling confused, if you experience any of these symptoms, please do not delay to seek treatment.  Some people experience gastrointestinal problems with  Covid, such as vomiting and diarrhea, dehydration is a serious risk and should be avoided. If you are unable to keep liquids down you may need to go to the emergency department for intravenous fluids to avoid dehydration.   Please alert and involve your family and/or friends to help keep an eye on you while you recover from  Covid-19. If you have any questions or concerns about your recovery, please do not hesitate to call the office for guidance.   COVID-19: What to Do if You Are Sick If you have a fever, cough or other symptoms, you might have COVID-19. Most people have mild illness and are able to recover at home. If you are sick:  Keep track of your symptoms.  If you have an emergency warning sign (including trouble breathing), call 911. Steps to help prevent the spread of COVID-19 if you are sick If you are sick with COVID-19 or think you might have COVID-19, follow the steps below to care for yourself and to help protect other people in your home and community. Stay home except to get medical care  Stay home. Most people with COVID-19 have mild illness and can recover at home without medical care. Do not leave your home, except to get medical care. Do not visit public areas.  Take care of yourself. Get rest and stay hydrated. Take over-the-counter medicines, such as acetaminophen, to help you feel better.  Stay in touch with your doctor. Call before you get medical care. Be sure to get care if you have trouble breathing, or have any other emergency warning signs, or if you think it is an emergency.  Avoid public transportation, ride-sharing, or taxis. Separate yourself from other people As much as possible, stay in a specific room and away from other people and pets in your home. If possible, you should use a separate bathroom. If you need to be around other people or animals in or outside of the home, wear a mask. Tell your close contactsthat they may have been exposed to COVID-19. An infected person can spread COVID-19 starting 48 hours (or 2 days) before the person has any symptoms or tests positive. By letting your close contacts know they may have been exposed to COVID-19, you are helping to protect everyone.  Additional guidance is available for those living in close quarters and shared housing.  See  COVID-19 and Animals if you have questions about pets.  If you are diagnosed with COVID-19, someone from the health department may call you. Answer the call to slow the spread. Monitor your symptoms  Symptoms of COVID-19 include fever, cough, or other symptoms.  Follow care instructions from your healthcare provider and local health department. Your local health authorities may give instructions on checking your symptoms and reporting information. When to seek emergency medical attention Look for emergency warning signs* for COVID-19. If someone is showing any of these signs, seek emergency medical care immediately:  Trouble breathing  Persistent pain or pressure in the chest  New confusion  Inability to wake or stay awake  Pale, gray, or blue-colored skin, lips, or nail beds, depending on skin tone *This list is not all possible symptoms. Please call your medical provider for any other symptoms that are severe or concerning to you. Call 911 or call ahead to your local emergency facility: Notify the operator that you are seeking care for someone who has or may have COVID-19. Call ahead before visiting your doctor  Call ahead. Many medical visits for  routine care are being postponed or done by phone or telemedicine.  If you have a medical appointment that cannot be postponed, call your doctor's office, and tell them you have or may have COVID-19. This will help the office protect themselves and other patients. Get  tested  If you have symptoms of COVID-19, get tested. While waiting for test results, you stay away from others, including staying apart from those living in your household.  You can visit your state, tribal, local, and territorialhealth department's website to look for the latest local information on testing sites. If you are sick, wear a mask over your nose and mouth  You should wear a mask over your nose and mouth if you must be around other people or animals,  including pets (even at home).  You don't need to wear the mask if you are alone. If you can't put on a mask (because of trouble breathing, for example), cover your coughs and sneezes in some other way. Try to stay at least 6 feet away from other people. This will help protect the people around you.  Masks should not be placed on young children under age 79 years, anyone who has trouble breathing, or anyone who is not able to remove the mask without help. Note: During the COVID-19 pandemic, medical grade facemasks are reserved for healthcare workers and some first responders. Cover your coughs and sneezes  Cover your mouth and nose with a tissue when you cough or sneeze.  Throw away used tissues in a lined trash can.  Immediately wash your hands with soap and water for at least 20 seconds. If soap and water are not available, clean your hands with an alcohol-based hand sanitizer that contains at least 60% alcohol. Clean your hands often  Wash your hands often with soap and water for at least 20 seconds. This is especially important after blowing your nose, coughing, or sneezing; going to the bathroom; and before eating or preparing food.  Use hand sanitizer if soap and water are not available. Use an alcohol-based hand sanitizer with at least 60% alcohol, covering all surfaces of your hands and rubbing them together until they feel dry.  Soap and water are the best option, especially if hands are visibly dirty.  Avoid touching your eyes, nose, and mouth with unwashed hands.  Handwashing Tips Avoid sharing personal household items  Do not share dishes, drinking glasses, cups, eating utensils, towels, or bedding with other people in your home.  Wash these items thoroughly after using them with soap and water or put in the dishwasher. Clean all "high-touch" surfaces everyday  Clean and disinfect high-touch surfaces in your "sick room" and bathroom; wear disposable gloves. Let someone else  clean and disinfect surfaces in common areas, but you should clean your bedroom and bathroom, if possible.  If a caregiver or other person needs to clean and disinfect a sick person's bedroom or bathroom, they should do so on an as-needed basis. The caregiver/other person should wear a mask and disposable gloves prior to cleaning. They should wait as long as possible after the person who is sick has used the bathroom before coming in to clean and use the bathroom. ? High-touch surfaces include phones, remote controls, counters, tabletops, doorknobs, bathroom fixtures, toilets, keyboards, tablets, and bedside tables.  Clean and disinfect areas that may have blood, stool, or body fluids on them.  Use household cleaners and disinfectants. Clean the area or item with soap and water or another detergent  if it is dirty. Then, use a household disinfectant. ? Be sure to follow the instructions on the label to ensure safe and effective use of the product. Many products recommend keeping the surface wet for several minutes to ensure germs are killed. Many also recommend precautions such as wearing gloves and making sure you have good ventilation during use of the product. ? Use a product from [employer] List N: Disinfectants for Coronavirus (LAGTX-64). ? Complete Disinfection Guidance When you can be around others after being sick with COVID-19 Deciding when you can be around others is different for different situations. Find out when you can safely end home isolation. For any additional questions about your care, contact your healthcare provider or state or local health department. 09/06/2019 Content source: [facility] ([facility]), Division of Viral Diseases This information is not intended to replace advice given to you by your health care provider. Make sure you discuss any questions you have with your health care provider. Document Revised: 04/22/2020 Document  Reviewed: 04/22/2020 Elsevier Patient Education  2021 [employer].

## 2020-07-12 ENCOUNTER — Other Ambulatory Visit: Payer: Self-pay | Admitting: Family Medicine

## 2020-07-12 DIAGNOSIS — U071 COVID-19: Secondary | ICD-10-CM | POA: Insufficient documentation

## 2020-07-12 LAB — NOVEL CORONAVIRUS, NAA: SARS-CoV-2, NAA: DETECTED — AB

## 2020-07-12 LAB — SARS-COV-2, NAA 2 DAY TAT

## 2020-08-02 ENCOUNTER — Other Ambulatory Visit: Payer: Self-pay | Admitting: Family Medicine

## 2020-08-02 DIAGNOSIS — Z20822 Contact with and (suspected) exposure to covid-19: Secondary | ICD-10-CM

## 2020-08-02 DIAGNOSIS — R059 Cough, unspecified: Secondary | ICD-10-CM

## 2020-08-04 DIAGNOSIS — Z029 Encounter for administrative examinations, unspecified: Secondary | ICD-10-CM

## 2020-08-05 ENCOUNTER — Other Ambulatory Visit: Payer: Self-pay

## 2020-08-05 ENCOUNTER — Encounter: Payer: Self-pay | Admitting: Family Medicine

## 2020-08-05 ENCOUNTER — Telehealth: Payer: Self-pay | Admitting: Family Medicine

## 2020-08-05 ENCOUNTER — Ambulatory Visit (INDEPENDENT_AMBULATORY_CARE_PROVIDER_SITE_OTHER): Payer: BC Managed Care – PPO | Admitting: Family Medicine

## 2020-08-05 VITALS — BP 118/78 | HR 82 | Temp 97.0°F | Ht 61.0 in | Wt 127.0 lb

## 2020-08-05 DIAGNOSIS — L739 Follicular disorder, unspecified: Secondary | ICD-10-CM

## 2020-08-05 DIAGNOSIS — Z1211 Encounter for screening for malignant neoplasm of colon: Secondary | ICD-10-CM

## 2020-08-05 MED ORDER — DOXYCYCLINE HYCLATE 100 MG PO TABS: 100.0000 mg | ORAL_TABLET | Freq: Two times a day (BID) | ORAL | 0 refills | Status: DC

## 2020-08-05 NOTE — Progress Notes (Signed)
   Subjective:    Patient ID: Peggy Franklin, female    DOB: 02-11-1971, 50 y.o.   MRN: [id number]  HPI Patient arrives with sore spots in nose since Wednesday. Severe pain Swollen tender Right side worse Very painful to blow her nose Never had this before  Side note had covid times 2 in the past has had vaccine Review of Systems     Objective:   Physical Exam Lungs are clear respiratory rate is normal heart is regular eardrums are normal nares reddened on the right side with what appears to be folliculitis       Assessment & Plan:  Folliculitis Could be MRSA Doxycycline twice daily for the next 10 days would be reasonable  In regards to FMLA Should be noted that the patient did have diarrhea after being on doxycycline but she feels that this was most likely gastroenteritis because she had severe nausea diarrhea she ended up coming to be seen in the right afterwards she states the diarrhea checked out and she never had to get the C. difficile test completed.  She states that she missed work from December 7 through December 13 due to the severe intestinal pains diarrhea and she did do phone contact with [place] as well as a telehealth visit during that week. She states her workplace is sending her paperwork that is being forwarded to [place] for processing and filling out to cover her from December 7 through December 13 with return to work on December 14 Based upon evaluation of the record and also discussion with the patient I told her that we could fill this out.

## 2020-08-05 NOTE — Progress Notes (Signed)
Referral ordered in EPIC. 

## 2020-08-05 NOTE — Telephone Encounter (Signed)
Please call patient and have her set up a follow up appt for inhaler. thank you.   Chalmers Guest, NP  Vicente Males, LPN Lavella Lemons,   About the request for the inhaler. I sent it, but wondering if she needs another office visit. Is she still requiring an inhaler from January 19 Covid infection? That could be concerning. Please follow-up with her about this.   Thanks, Santiago Glad

## 2020-08-05 NOTE — Addendum Note (Signed)
Addended by: Dairl Ponder on: 08/05/2020 01:36 PM   Modules accepted: Orders

## 2020-08-07 ENCOUNTER — Encounter (INDEPENDENT_AMBULATORY_CARE_PROVIDER_SITE_OTHER): Payer: Self-pay | Admitting: *Deleted

## 2020-08-19 ENCOUNTER — Encounter: Payer: Self-pay | Admitting: Emergency Medicine

## 2020-08-19 ENCOUNTER — Other Ambulatory Visit: Payer: Self-pay

## 2020-08-19 ENCOUNTER — Ambulatory Visit
Admission: EM | Admit: 2020-08-19 | Discharge: 2020-08-19 | Disposition: A | Discharge status: Home / Self Care | Payer: BC Managed Care – PPO | Attending: Family Medicine | Admitting: Family Medicine

## 2020-08-19 DIAGNOSIS — A084 Viral intestinal infection, unspecified: Secondary | ICD-10-CM

## 2020-08-19 MED ORDER — DICYCLOMINE HCL 20 MG PO TABS: 20.0000 mg | ORAL_TABLET | Freq: Three times a day (TID) | ORAL | 0 refills | Status: DC

## 2020-08-19 MED ORDER — ONDANSETRON 4 MG PO TBDP
4.0000 mg | ORAL_TABLET | Freq: Once | ORAL | Status: AC
  Administered 2020-08-19: 4 mg via ORAL

## 2020-08-19 MED ORDER — DIPHENOXYLATE-ATROPINE 2.5-0.025 MG PO TABS: 1.0000 | ORAL_TABLET | Freq: Four times a day (QID) | ORAL | 0 refills | Status: AC | PRN

## 2020-08-19 MED ORDER — ONDANSETRON HCL 4 MG PO TABS: 4.0000 mg | ORAL_TABLET | Freq: Four times a day (QID) | ORAL | 0 refills | Status: DC

## 2020-08-19 NOTE — Discharge Instructions (Signed)
If you develop severe abdominal pain or unable to hold any fluids on her stomach with current treatment go immediately to the ER for further work-up and evaluation of symptoms.

## 2020-08-19 NOTE — ED Triage Notes (Addendum)
N/V/D since yesterday.  Was around grandchildren this weekend that were sick with the same.

## 2020-08-19 NOTE — ED Provider Notes (Signed)
[facility]  CSN: [id number] Arrival date & time: 08/19/20  1128      History   Chief Complaint Chief Complaint  Patient presents with  . Emesis    HPI Peggy Franklin is a 50 y.o. female.   HPI  Patient presents today for evaluation of GI symptoms including watery stool, nausea with vomitus x1 day.  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  Her symptoms developed as nausea with vomiting earlier yesterday and has persisted throughout the night.  She endorses persistent abdominal cramping and has been intolerant of any food and has no appetite.  She has been able to drink fluids.  She did take 1 dose of Zofran yesterday that she had leftover however has not had any other medication and ran out of Zofran yesterday.  Denies fever or any other URI symptoms.  Past Medical History:  Diagnosis Date  . History of asplenia 07/10/2019    Patient Active Problem List   Diagnosis Date Noted  . COVID-19 virus infection 07/12/2020  . Close exposure to 2019 novel coronavirus 07/10/2020  . Diarrhea 05/30/2020  . Cough 02/05/2020  . Head congestion 02/05/2020  . History of asplenia 07/10/2019  . Genital warts 04/08/2018  . Anxiety 04/08/2018  . Anal skin tag 03/28/2015  . Gastroesophageal reflux disease without esophagitis 03/07/2015  . Postsurgical dumping syndrome 01/20/2013    Past Surgical History:  Procedure Laterality Date  . ABDOMINAL HYSTERECTOMY    . COLON SURGERY    . LAPAROSCOPIC BILATERAL SALPINGO OOPHERECTOMY Bilateral     OB History    Gravida  3   Para  3   Term  2   Preterm  1   AB      Living  2     SAB      IAB      Ectopic      Multiple      Live Births               Home Medications    Prior to Admission medications   Medication Sig Start Date End Date Taking? Authorizing Provider  albuterol (VENTOLIN HFA) 108 (90 Base) MCG/ACT  inhaler INHALE 2 PUFFS INTO THE LUNGS EVERY 6 HOURS AS NEEDED FOR WHEEZING OR SHORTNESS OF BREATH 08/04/20   Chalmers Guest, NP  benzonatate (TESSALON) 100 MG capsule Take 1 capsule (100 mg total) by mouth 2 (two) times daily as needed for cough. Patient not taking: Reported on 08/05/2020 07/10/20   Chalmers Guest, NP  doxycycline (VIBRA-TABS) 100 MG tablet Take 1 tablet (100 mg total) by mouth 2 (two) times daily. 08/05/20   Kathyrn Drown, MD  pantoprazole (PROTONIX) 40 MG tablet TAKE 1 TABLET(40 MG) BY MOUTH DAILY 02/19/20   Kathyrn Drown, MD    Family History Family History  Problem Relation Age of Onset  . Cancer Mother 40       pancreatic cancer  . Cancer Maternal Grandmother        renal and uterine cancer  . Heart attack Maternal Grandmother   . Emphysema Paternal Grandfather   . Stroke Paternal Grandmother   . Other Maternal Grandfather        accident  . Heart attack Daughter     Social History Social History   Tobacco Use  . Smoking status: Current Every Day Smoker    Packs/day: 0.50  Types: Cigarettes    Start date: 04/06/2013  . Smokeless tobacco: Never Used  . Tobacco comment: Smokes E cig  Vaping Use  . Vaping Use: Never used  Substance Use Topics  . Alcohol use: No  . Drug use: No     Allergies   Other   Review of Systems Review of Systems Pertinent negatives listed in HPI  Physical Exam Triage Vital Signs ED Triage Vitals  Enc Vitals Group     BP 08/19/20 1145 106/70     Pulse Rate 08/19/20 1145 (!) 109     Resp 08/19/20 1145 17     Temp 08/19/20 1145 99.2 F (37.3 C)     Temp Source 08/19/20 1145 Oral     SpO2 08/19/20 1145 96 %     Weight --      Height --      Head Circumference --      Peak Flow --      Pain Score 08/19/20 1143 8     Pain Loc --      Pain Edu? --      Excl. in [place]? --    No data found.  Updated Vital Signs BP 106/70 (BP Location: Left Arm)   Pulse (!) 109   Temp 99.2 F (37.3 C) (Oral)   Resp 17   SpO2  96%   Visual Acuity Right Eye Distance:   Left Eye Distance:   Bilateral Distance:    Right Eye Near:   Left Eye Near:    Bilateral Near:     Physical Exam General appearance: Alert, acutely ill-appearing cooperative  Head: Normocephalic, without obvious abnormality, atraumatic Respiratory: Respirations even and unlabored, normal respiratory rate Heart: rate and rhythm normal. No gallop or murmurs noted on exam  Abdomen: BS hyperactive , no distention, no rebound tenderness, or no mass Extremities: No gross deformities Skin: Skin color, texture, turgor normal. No rashes seen  Psych: Appropriate mood and affect. Neurologic: GCS 15, normal gait, normal coordination UC Treatments / Results  Labs (all labs ordered are listed, but only abnormal results are displayed) Labs Reviewed - No data to display  EKG   Radiology No results found.  Procedures Procedures (including critical care time)  Medications Ordered in UC Medications  ondansetron (ZOFRAN-ODT) disintegrating tablet 4 mg (has no administration in time range)    Initial Impression / Assessment and Plan / UC Course  I have reviewed the triage vital signs and the nursing notes.  Pertinent labs & imaging results that were available during my care of the patient were reviewed by me and considered in my medical decision making (see chart for details).     Treating for viral gastroenteritis.  Continue Zofran as prescribed.  Dicyclomine for abdominal cramping.  Lomotil for loose stools.  Advance diet as tolerated.  Force fluids.  Work note given with a return date in 2 days on 08/21/2020. ER precautions given if abdominal pain worsens and/or patient is intolerant of fluids she is to go immediately to the ER for further work-up and evaluation.  Final Clinical Impressions(s) / UC Diagnoses   Final diagnoses:  Viral gastroenteritis     Discharge Instructions     If you develop severe abdominal pain or unable to hold  any fluids on her stomach with current treatment go immediately to the ER for further work-up and evaluation of symptoms.    ED Prescriptions    Medication Sig Dispense Auth. Provider   dicyclomine (BENTYL) 20 MG  tablet Take 1 tablet (20 mg total) by mouth 4 (four) times daily -  before meals and at bedtime for 3 days. 12 tablet Scot Jun, FNP   ondansetron (ZOFRAN) 4 MG tablet Take 1 tablet (4 mg total) by mouth every 6 (six) hours. 12 tablet Scot Jun, FNP   diphenoxylate-atropine (LOMOTIL) 2.5-0.025 MG tablet Take 1 tablet by mouth 4 (four) times daily as needed for up to 3 days for diarrhea or loose stools. 12 tablet Scot Jun, FNP     PDMP not reviewed this encounter.   Scot Jun, FNP 08/19/20 [phone]

## 2020-08-27 ENCOUNTER — Telehealth: Payer: Self-pay | Admitting: Family Medicine

## 2020-08-27 ENCOUNTER — Telehealth: Payer: Self-pay

## 2020-08-27 ENCOUNTER — Other Ambulatory Visit: Payer: Self-pay | Admitting: Family Medicine

## 2020-08-27 DIAGNOSIS — R059 Cough, unspecified: Secondary | ICD-10-CM

## 2020-08-27 DIAGNOSIS — Z029 Encounter for administrative examinations, unspecified: Secondary | ICD-10-CM

## 2020-08-27 DIAGNOSIS — Z20822 Contact with and (suspected) exposure to covid-19: Secondary | ICD-10-CM

## 2020-08-27 NOTE — Telephone Encounter (Signed)
Patient had FMLA faxed over to be completed. I filled in what I could. Please review form add as needed,please complete pages 6 and 7 sign and date. In your box.

## 2020-08-27 NOTE — Telephone Encounter (Signed)
Patient had FMLA faxed over to be completed. I filled in what I could ,please review and fil in if needed in areas. Please complete pages 6 and 7. Date and sign form

## 2020-09-02 ENCOUNTER — Telehealth: Payer: Self-pay | Admitting: Family Medicine

## 2020-09-02 NOTE — Telephone Encounter (Signed)
Patient calling checking on FMLA take were sent back on 3/8 for completion. They were put in your box on the 8th. Please advise

## 2020-09-03 NOTE — Telephone Encounter (Signed)
I completed what I could it is in the outgoing pile in my office

## 2020-09-03 NOTE — Telephone Encounter (Signed)
Completed, please have patient review over this, if any changes are necessary let me know

## 2020-09-20 ENCOUNTER — Other Ambulatory Visit: Payer: Self-pay

## 2020-09-20 MED ORDER — PANTOPRAZOLE SODIUM 40 MG PO TBEC: DELAYED_RELEASE_TABLET | ORAL | 0 refills | Status: DC

## 2020-09-24 ENCOUNTER — Other Ambulatory Visit: Payer: Self-pay

## 2020-09-24 ENCOUNTER — Other Ambulatory Visit: Payer: Self-pay | Admitting: Family Medicine

## 2020-09-24 ENCOUNTER — Ambulatory Visit (INDEPENDENT_AMBULATORY_CARE_PROVIDER_SITE_OTHER): Payer: BC Managed Care – PPO | Admitting: Family Medicine

## 2020-09-24 ENCOUNTER — Encounter: Payer: Self-pay | Admitting: Family Medicine

## 2020-09-24 VITALS — HR 80 | Temp 97.9°F | Resp 18 | Wt 123.8 lb

## 2020-09-24 DIAGNOSIS — J029 Acute pharyngitis, unspecified: Secondary | ICD-10-CM | POA: Diagnosis not present

## 2020-09-24 DIAGNOSIS — R059 Cough, unspecified: Secondary | ICD-10-CM

## 2020-09-24 DIAGNOSIS — J301 Allergic rhinitis due to pollen: Secondary | ICD-10-CM | POA: Diagnosis not present

## 2020-09-24 DIAGNOSIS — H66001 Acute suppurative otitis media without spontaneous rupture of ear drum, right ear: Secondary | ICD-10-CM | POA: Diagnosis not present

## 2020-09-24 LAB — POCT RAPID STREP A (OFFICE): Rapid Strep A Screen: NEGATIVE

## 2020-09-24 MED ORDER — BENZONATATE 100 MG PO CAPS: 100.0000 mg | ORAL_CAPSULE | Freq: Two times a day (BID) | ORAL | 0 refills | Status: DC | PRN

## 2020-09-24 MED ORDER — DOXYCYCLINE HYCLATE 100 MG PO TABS: 100.0000 mg | ORAL_TABLET | Freq: Two times a day (BID) | ORAL | 0 refills | Status: DC

## 2020-09-24 MED ORDER — CETIRIZINE HCL 10 MG PO TABS: 10.0000 mg | ORAL_TABLET | Freq: Every day | ORAL | 1 refills | Status: DC

## 2020-09-24 NOTE — Patient Instructions (Signed)
Otitis Media, Adult  Otitis media is a condition in which the middle ear is red and swollen (inflamed) and full of fluid. The middle ear is the part of the ear that contains bones for hearing as well as air that helps send sounds to the brain. The condition usually goes away on its own. What are the causes? This condition is caused by a blockage in the eustachian tube. The eustachian tube connects the middle ear to the back of the nose. It normally allows air into the middle ear. The blockage is caused by fluid or swelling. Problems that can cause blockage include:  A cold or infection that affects the nose, mouth, or throat.  Allergies.  An irritant, such as tobacco smoke.  Adenoids that have become large. The adenoids are soft tissue located in the back of the throat, behind the nose and the roof of the mouth.  Growth or swelling in the upper part of the throat, just behind the nose (nasopharynx).  Damage to the ear caused by change in pressure. This is called barotrauma. What are the signs or symptoms? Symptoms of this condition include:  Ear pain.  Fever.  Problems with hearing.  Being tired.  Fluid leaking from the ear.  Ringing in the ear. How is this treated? This condition can go away on its own within 3-5 days. But if the condition is caused by bacteria or does not go away on its own, or if it keeps coming back, your doctor may:  Give you antibiotic medicines.  Give you medicines for pain. Follow these instructions at home:  Take over-the-counter and prescription medicines only as told by your doctor.  If you were prescribed an antibiotic medicine, take it as told by your doctor. Do not stop taking the antibiotic even if you start to feel better.  Keep all follow-up visits as told by your doctor. This is important. Contact a doctor if:  You have bleeding from your nose.  There is a lump on your neck.  You are not feeling better in 5 days.  You feel worse  instead of better. Get help right away if:  You have pain that is not helped with medicine.  You have swelling, redness, or pain around your ear.  You get a stiff neck.  You cannot move part of your face (paralysis).  You notice that the bone behind your ear hurts when you touch it.  You get a very bad headache. Summary  Otitis media means that the middle ear is red, swollen, and full of fluid.  This condition usually goes away on its own.  If the problem does not go away, treatment may be needed. You may be given medicines to treat the infection or to treat your pain.  If you were prescribed an antibiotic medicine, take it as told by your doctor. Do not stop taking the antibiotic even if you start to feel better.  Keep all follow-up visits as told by your doctor. This is important. This information is not intended to replace advice given to you by your health care provider. Make sure you discuss any questions you have with your health care provider. Document Revised: 05/11/2019 Document Reviewed: 05/11/2019 Elsevier Patient Education  2021 [employer]. How to Perform a Sinus Rinse A sinus rinse is a home treatment. It rinses your sinuses with a mixture of salt and water (saline solution). Sinuses are air-filled spaces in your skull behind the bones of your face and forehead. They  open into your nasal cavity. A sinus rinse can help to clear your nasal cavity. It can clear mucus, dirt, dust, or pollen. You may do a sinus rinse when you have:  A cold.  A virus.  Allergies.  A sinus infection.  A stuffy nose. Talk with your doctor about whether a sinus rinse might help you. What are the risks? A sinus rinse is normally very safe and helpful. However, there are a few risks. These include:  A burning feeling in the sinuses. This may happen if you do not make the saline solution as instructed. Be sure to follow all directions when making the saline solution.  Nasal  irritation.  Infection from unclean water. This is rare, but possible. Do not do a sinus rinse if you have had:  Ear or nasal surgery.  An ear infection.  Blocked ears. Supplies needed:  Saline solution or powder.  Distilled or germ-free (sterile) water may be needed to mix with saline powder. ? You may use boiled and cooled tap water. Boil tap water for 5 minutes; cool until it is lukewarm. Use within 24 hours. ? Do not use regular tap water to mix with the saline solution.  Neti pot or nasal rinse bottle. This releases the saline solution into your nose and through your sinuses. You can buy neti pots and rinse bottles: ? At your local pharmacy. ? At a health food store. ? Online. How to perform a sinus rinse 1. Wash your hands with soap and water. 2. Wash your device using the directions that came with it. 3. Dry your device. 4. Use the solution that comes with your device or one that is sold separately in stores. Follow the mixing directions on the package if you need to mix with sterile or distilled water. 5. Fill your device with the amount of saline solution stated in the device instructions. 6. Stand over a sink and tilt your head sideways over the sink. 7. Place the spout of the device in your upper nostril (the one closer to the ceiling). 8. Gently pour or squeeze the saline solution into your nasal cavity. The liquid should drain to your lower nostril if you are not too stuffed up (congested). 9. While rinsing, breathe through your open mouth. 10. Gently blow your nose to clear any mucus and rinse solution. Blowing too hard may cause ear pain. 11. Repeat in your other nostril. 12. Clean and rinse your device with clean water. 13. Air-dry your device. Talk with your doctor or pharmacist if you have questions about how to do a sinus rinse.   Summary  A sinus rinse is a home treatment. It rinses your sinuses with a mixture of salt and water (saline solution).  A sinus  rinse is normally very safe and helpful. Follow all instructions carefully.  Talk with your doctor about whether a sinus rinse might help you. This information is not intended to replace advice given to you by your health care provider. Make sure you discuss any questions you have with your health care provider. Document Revised: 03/19/2020 Document Reviewed: 03/19/2020 Elsevier Patient Education  2021 [employer].

## 2020-09-24 NOTE — Progress Notes (Signed)
Patient ID: Peggy Franklin, female    DOB: 1970/09/17, 50 y.o.   MRN: [id number]   Chief Complaint  Patient presents with  . Cough   Subjective:  CC; cough, congestion, sore throat  This is a new problem.  Presents today for an acute visit with a complaint of cough, congestion, sore throat, and headache.  Symptoms have been present for 11 days.  Associated symptoms include right ear feeling stopped up, she does wear earplugs at work.  Reports that she had a fever 1 week ago.  Is a current cigarette smoker.  Reports that she has been working at a different job station, where she has been exposed to thick dust for 12-hour shifts.  Has tried Advil, cold and flu medication, Sudafed, Robitussin, and Tessalon Perles, these are not helping.  Is allergic to steroids, reports hives.  Has an albuterol inhaler.  And Flonase.   Patient presents today with respiratory illness Number of days present- 11 days   Symptoms include- cough, congestion,  Presence of worrisome signs (severe shortness of breath, lethargy, etc.) - none  Recent/current visit to urgent care or ER- none  Recent direct exposure to Covid- none  Any current Covid testing- none   Medical History Peggy Franklin has a past medical history of History of asplenia (07/10/2019).   Outpatient Encounter Medications as of 09/24/2020  Medication Sig  . albuterol (VENTOLIN HFA) 108 (90 Base) MCG/ACT inhaler INHALE 2 PUFFS INTO THE LUNGS EVERY 6 HOURS AS NEEDED FOR WHEEZING OR SHORTNESS OF BREATH  . benzonatate (TESSALON) 100 MG capsule Take 1 capsule (100 mg total) by mouth 2 (two) times daily as needed for cough.  . cetirizine (ZYRTEC ALLERGY) 10 MG tablet Take 1 tablet (10 mg total) by mouth daily.  Marland Kitchen doxycycline (VIBRA-TABS) 100 MG tablet Take 1 tablet (100 mg total) by mouth 2 (two) times daily.  . pantoprazole (PROTONIX) 40 MG tablet TAKE 1 TABLET(40 MG) BY MOUTH DAILY  . [DISCONTINUED] benzonatate (TESSALON) 100 MG capsule Take 1 capsule (100  mg total) by mouth 2 (two) times daily as needed for cough.  . [DISCONTINUED] dicyclomine (BENTYL) 20 MG tablet Take 1 tablet (20 mg total) by mouth 4 (four) times daily -  before meals and at bedtime for 3 days.  . [DISCONTINUED] doxycycline (VIBRA-TABS) 100 MG tablet Take 1 tablet (100 mg total) by mouth 2 (two) times daily.  . [DISCONTINUED] ondansetron (ZOFRAN) 4 MG tablet Take 1 tablet (4 mg total) by mouth every 6 (six) hours.   No facility-administered encounter medications on file as of 09/24/2020.     Review of Systems  Constitutional: Positive for fever. Negative for chills.  HENT: Positive for congestion, postnasal drip and sore throat. Negative for ear pain, sinus pressure and sinus pain.   Respiratory: Positive for cough. Negative for shortness of breath.   Cardiovascular: Negative for chest pain.  Gastrointestinal: Negative for abdominal pain.  Neurological: Positive for headaches.       From cough     Vitals Pulse 80   Temp 97.9 F (36.6 C)   Resp 18   Wt 123 lb 12.8 oz (56.2 kg)   SpO2 96%   BMI 23.39 kg/m   Objective:   Physical Exam Vitals reviewed.  Constitutional:      Appearance: Normal appearance.  HENT:     Right Ear: Tympanic membrane is erythematous.     Left Ear: Tympanic membrane normal.     Nose:     Right Turbinates:  Swollen.     Left Turbinates: Swollen.     Right Sinus: No maxillary sinus tenderness or frontal sinus tenderness.     Left Sinus: No maxillary sinus tenderness or frontal sinus tenderness.     Mouth/Throat:     Pharynx: Posterior oropharyngeal erythema present.     Tonsils: No tonsillar exudate.  Cardiovascular:     Rate and Rhythm: Normal rate and regular rhythm.     Heart sounds: Normal heart sounds.  Pulmonary:     Effort: Pulmonary effort is normal.     Breath sounds: Rhonchi present.     Comments: Current smoker Works around thick dust for 12 hour shifts. Skin:    General: Skin is warm and dry.  Neurological:      General: No focal deficit present.     Mental Status: She is alert.  Psychiatric:        Behavior: Behavior normal.      Assessment and Plan   1. Sore throat - POCT rapid strep A  2. Non-recurrent acute suppurative otitis media of right ear without spontaneous rupture of tympanic membrane - doxycycline (VIBRA-TABS) 100 MG tablet; Take 1 tablet (100 mg total) by mouth 2 (two) times daily.  Dispense: 20 tablet; Refill: 0  3. Seasonal allergic rhinitis due to pollen - cetirizine (ZYRTEC ALLERGY) 10 MG tablet; Take 1 tablet (10 mg total) by mouth daily.  Dispense: 30 tablet; Refill: 1  4. Cough - benzonatate (TESSALON) 100 MG capsule; Take 1 capsule (100 mg total) by mouth 2 (two) times daily as needed for cough.  Dispense: 20 capsule; Refill: 0   Right TM erythematous, will treat with antibiotic for 10 days.  Current smoker, and thick dust exposure at work, rhonchi present.  Rapid strep negative, will send for culture.  Encouraged allergy medication during pollen season.  Refill sent for [facility] for cough.   Agrees with plan of care discussed today. Understands warning signs to seek further care: chest pain, shortness of breath, any significant change in health.  Understands to follow-up in 2 days if not significantly improved, will send for chest x-ray due to dust exposure and rhonchi.  Declines Covid testing today.    [name], NP 09/24/2020

## 2020-09-25 LAB — STREP A DNA PROBE: Strep Gp A Direct, DNA Probe: NEGATIVE

## 2020-10-16 ENCOUNTER — Telehealth: Payer: Self-pay | Admitting: Family Medicine

## 2020-10-16 NOTE — Telephone Encounter (Signed)
Patient had disability faxed over to be completed in your box to review ,date and sign.

## 2020-10-18 ENCOUNTER — Other Ambulatory Visit: Payer: Self-pay | Admitting: Family Medicine

## 2020-10-20 NOTE — Telephone Encounter (Signed)
These were completed thank you

## 2020-10-21 DIAGNOSIS — Z029 Encounter for administrative examinations, unspecified: Secondary | ICD-10-CM

## 2020-10-31 ENCOUNTER — Telehealth: Payer: Self-pay | Admitting: *Deleted

## 2020-10-31 NOTE — Telephone Encounter (Signed)
Patient is having problems w/ right foot that has a spot that may be growing and swelling on the back of heel, hard to get shoe on and off. Please schedule.her last office visit.Please schedule for upcoming appointment

## 2020-11-05 NOTE — Telephone Encounter (Signed)
Called patient; lvm for them to call the office to schedule an appointment with out [place] with Dr. Amalia Hailey within the next 1-2 weeks to get evaluated.

## 2020-11-11 ENCOUNTER — Ambulatory Visit: Payer: BC Managed Care – PPO | Admitting: Podiatry

## 2020-11-25 ENCOUNTER — Ambulatory Visit: Payer: BC Managed Care – PPO | Admitting: Podiatry

## 2020-11-25 ENCOUNTER — Other Ambulatory Visit: Payer: Self-pay

## 2020-11-25 DIAGNOSIS — M7989 Other specified soft tissue disorders: Secondary | ICD-10-CM | POA: Diagnosis not present

## 2020-11-25 NOTE — Progress Notes (Signed)
   HPI: 50 y.o. female presenting today for recurrence of pain and tenderness to the lateral aspect of the right foot.  She does have a history of excision of a benign skin lesion to the right foot overlying the fifth metatarsal tubercle.  DOS: 04/29/2020 performed in office.  Patient states that pain has recurred and there is no swelling and a large mass extending down to the bottom of the foot.  She presents for further treatment and evaluation  Past Medical History:  Diagnosis Date  . History of asplenia 07/10/2019     Physical Exam: General: The patient is alert and oriented x3 in no acute distress.  Dermatology: Skin is warm, dry and supple bilateral lower extremities. Negative for open lesions or macerations.  Vascular: Palpable pedal pulses bilaterally. No edema or erythema noted. Capillary refill within normal limits.  Neurological: Epicritic and protective threshold grossly intact bilaterally.   Musculoskeletal Exam: Range of motion within normal limits to all pedal and ankle joints bilateral. Muscle strength 5/5 in all groups bilateral.  Not adhered soft palpable mass noted around the fifth metatarsal tubercle with possible fluctuance.  May represent an adventitious bursa.  Assessment: 1.  Soft tissue mass right fifth metatarsal tubercle   Plan of Care:  1. Patient evaluated.  2.  Today were going to order MRI right foot to determine the extent of the soft tissue mass 3.  Patient will likely need surgical resection of the mass which is palpable and very symptomatic 4.  Return to clinic after MRI to review results and discuss surgical treatment options and likely excision of the mass  *Works on her feet 8-12 hours/day      Edrick Kins, DPM [facility]  Dr. Edrick Kins, DPM    [address], [place] [address]                Office [phone]  Fax [phone]

## 2020-11-26 ENCOUNTER — Telehealth: Payer: Self-pay | Admitting: Podiatry

## 2020-11-26 NOTE — Telephone Encounter (Signed)
Patient called and stated that she would like her MRI to be sent to [facility] in [place].

## 2020-11-27 ENCOUNTER — Other Ambulatory Visit: Payer: Self-pay | Admitting: Podiatry

## 2020-11-27 ENCOUNTER — Telehealth: Payer: Self-pay | Admitting: Podiatry

## 2020-11-27 DIAGNOSIS — M7989 Other specified soft tissue disorders: Secondary | ICD-10-CM

## 2020-11-27 DIAGNOSIS — L989 Disorder of the skin and subcutaneous tissue, unspecified: Secondary | ICD-10-CM

## 2020-11-27 NOTE — Telephone Encounter (Signed)
New MRI ordered to [facility]

## 2020-11-27 NOTE — Telephone Encounter (Signed)
Patient calling to request an out of work note. States she can not work due to severe pain in her foot. Patient stated that an MRI is needed to see "mass" in her foot causing pain. Patient has requested that MRI be scheduled wit [facility].

## 2020-11-27 NOTE — Telephone Encounter (Signed)
Okay to provide a note.

## 2020-11-27 NOTE — Progress Notes (Signed)
MRI sent to [facility] 

## 2020-11-28 ENCOUNTER — Encounter: Payer: Self-pay | Admitting: Podiatry

## 2020-11-28 ENCOUNTER — Telehealth: Payer: Self-pay | Admitting: Podiatry

## 2020-11-28 NOTE — Telephone Encounter (Signed)
Patient calling to see if it will be ok for her to return to work Sunday (6/12) if her foot is doing better, or would it be better for her to wait until her next visit. Will she need to be seen sooner due to pain?

## 2020-11-28 NOTE — Telephone Encounter (Signed)
Pt called stating she was just talking to someone about her mri that she had not been scheduled for it yet. She said she was told she would need to be scheduled 2 days after the mri to discuss the results and possible procedure. She is scheduled for the mri on 6.16 @ 710am and seeing Dr Amalia Hailey 130 on 7.20.22

## 2020-11-28 NOTE — Telephone Encounter (Signed)
If I could see her the following Monday after the MRI that would be great. - Dr. Amalia Hailey

## 2020-12-05 ENCOUNTER — Ambulatory Visit (HOSPITAL_COMMUNITY)
Admission: RE | Admit: 2020-12-05 | Discharge: 2020-12-05 | Disposition: A | Discharge status: Home / Self Care | Payer: BC Managed Care – PPO | Source: Ambulatory Visit | Attending: Podiatry | Admitting: Podiatry

## 2020-12-05 DIAGNOSIS — L989 Disorder of the skin and subcutaneous tissue, unspecified: Secondary | ICD-10-CM

## 2020-12-05 DIAGNOSIS — R2241 Localized swelling, mass and lump, right lower limb: Secondary | ICD-10-CM | POA: Diagnosis not present

## 2020-12-05 DIAGNOSIS — M7989 Other specified soft tissue disorders: Secondary | ICD-10-CM

## 2020-12-05 DIAGNOSIS — M2011 Hallux valgus (acquired), right foot: Secondary | ICD-10-CM | POA: Diagnosis not present

## 2020-12-05 DIAGNOSIS — M19071 Primary osteoarthritis, right ankle and foot: Secondary | ICD-10-CM | POA: Diagnosis not present

## 2020-12-09 ENCOUNTER — Other Ambulatory Visit: Payer: Self-pay

## 2020-12-09 ENCOUNTER — Ambulatory Visit: Payer: BC Managed Care – PPO | Admitting: Podiatry

## 2020-12-09 DIAGNOSIS — M7989 Other specified soft tissue disorders: Secondary | ICD-10-CM | POA: Diagnosis not present

## 2020-12-09 DIAGNOSIS — M898X7 Other specified disorders of bone, ankle and foot: Secondary | ICD-10-CM | POA: Diagnosis not present

## 2020-12-09 DIAGNOSIS — L989 Disorder of the skin and subcutaneous tissue, unspecified: Secondary | ICD-10-CM

## 2020-12-09 NOTE — Progress Notes (Signed)
   HPI: 50 y.o. female presenting today for recurrence of pain and tenderness to the lateral aspect of the right foot.  She does have a history of excision of a benign skin lesion to the right foot overlying the fifth metatarsal tubercle.  DOS: 04/29/2020 performed in office.   Patient continues to have severe pain and tenderness to this area.  She had an MRI performed since last visit.  She presents for follow-up treatment and evaluation  Past Medical History:  Diagnosis Date   History of asplenia 07/10/2019     Physical Exam: General: The patient is alert and oriented x3 in no acute distress.  Dermatology: Skin is warm, dry and supple bilateral lower extremities. Negative for open lesions or macerations.  Hypertrophic callus with a central nucleated core noted to the plantar aspect of the fifth metatarsal tubercle of the right foot  Vascular: Palpable pedal pulses bilaterally. No edema or erythema noted. Capillary refill within normal limits.  Neurological: Epicritic and protective threshold grossly intact bilaterally.   Musculoskeletal Exam: Range of motion within normal limits to all pedal and ankle joints bilateral. Muscle strength 5/5 in all groups bilateral.  Not adhered soft palpable mass noted around the fifth metatarsal tubercle with possible fluctuance.  May represent an adventitious bursa.  Patient also has severe pain and tenderness to this area and also around the porokeratosis lesion  MRI impression RT foot WO contrast 12/05/2020: Soft tissue There is some mildly increased T2 signal with corresponding T1 hypointensity involving the subcutaneous fat along the plantar and lateral aspect of the fifth metatarsal tubercle (series 3 and 5, image 24) and head (series 3 and 5, image 9). There is no discrete soft tissue mass or fluid collection.   IMPRESSION: 1. Small pressure lesions along the plantar and lateral aspect of the fifth metatarsal tubercle and head. No discrete  adventitial bursa or soft tissue mass.  Assessment: 1.  Soft tissue mass/likely adventitious bursa right fifth metatarsal tubercle 2.  Symptomatic porokeratosis right foot subfifth metatarsal tubercle    Plan of Care:  1. Patient evaluated.  MRI reviewed 2. Today we discussed the conservative versus surgical management of the presenting pathology. The patient opts for surgical management. All possible complications and details of the procedure were explained. All patient questions were answered. No guarantees were expressed or implied. 3. Authorization for surgery was initiated today. Surgery will consist of excision of benign skin lesion/porokeratosis right.  Excision of soft tissue mass/adventitious bursa right.  Possible exostectomy fifth metatarsal tubercle right 4.  Return to clinic 1 week postop  *Works on her feet 8-12 hours/day.  Patient understands she will need to take 1 month off from work.  Patient experienced nausea and vomiting with hydrocodone      Edrick Kins, DPM [facility]  Dr. Edrick Kins, DPM    [address], [place] [address]                Office [phone]  Fax [phone]

## 2020-12-12 ENCOUNTER — Telehealth: Payer: Self-pay | Admitting: Family Medicine

## 2020-12-12 ENCOUNTER — Other Ambulatory Visit: Payer: Self-pay

## 2020-12-12 MED ORDER — PANTOPRAZOLE SODIUM 40 MG PO TBEC: DELAYED_RELEASE_TABLET | ORAL | 0 refills | Status: DC

## 2020-12-12 NOTE — Telephone Encounter (Signed)
ERROR

## 2020-12-16 ENCOUNTER — Telehealth: Payer: Self-pay | Admitting: *Deleted

## 2020-12-16 NOTE — Telephone Encounter (Signed)
Patient is calling and wanting to ask doctor if  her incident at work is why she is having surgery or a skin lesion issue. Please advise.

## 2020-12-16 NOTE — Telephone Encounter (Signed)
No, this is not really work-related.  This is a skin lesion issue that caused inflammation in this area.  Aggravated by walking.  Thanks, Dr. Amalia Hailey

## 2020-12-17 ENCOUNTER — Telehealth: Payer: Self-pay | Admitting: Urology

## 2020-12-17 NOTE — Telephone Encounter (Signed)
DOS - 01/09/21  [employer] RIGHT --- [phone] [facility] BENIGN LESION RIGHT --- [address] [facility] RIGHT --- [id number]   BCBS EFFECTIVE DATE - 09/21/14   PLAN DEDUCTIBLE -$750.00 W/ $0.00 REMIANING OUT OF POCKET - $3,500.00 W/ $2,224.00 REMAINING COINSURANCE - 0% COPAY - $0.00   SPOKE WITH ODESSA WITH [employer] AND SHE STATED THAT CPT CODES 22179, 11421 AND 81025 NO PRIOR AUTH IS REQUIRED.  REF # I - [id number]

## 2020-12-18 ENCOUNTER — Telehealth: Payer: Self-pay | Admitting: Family Medicine

## 2020-12-18 ENCOUNTER — Encounter: Payer: Self-pay | Admitting: Family Medicine

## 2020-12-18 NOTE — Telephone Encounter (Signed)
Peggy Franklin This form appeared, I filled it out, I also dictated a letter, please review it if there is anything else you think it needs let me know otherwise process it in the standard way to the patient  What was confusing is that this also had checked 6 months intermittent when it was really more so for a short span of time when she was in the urgent care and laid up at home with sickness.  Once again I filled it out as best as I could hopefully that will suffice thank you

## 2020-12-18 NOTE — Telephone Encounter (Signed)
Patient checking on FMLA paper work that was dropped off on 11/25/20 to be updated due to incorrections on some of the paper work. Please advise when papers will be ready. She states cant receive any more points she will be let go, need as soon as possible.

## 2020-12-18 NOTE — Telephone Encounter (Signed)
Returned call to patient, no answer, left vmessage per Dr Rebekah Chesterfield note.

## 2020-12-19 NOTE — Telephone Encounter (Signed)
Please advise. Thank you

## 2020-12-24 ENCOUNTER — Other Ambulatory Visit: Payer: Self-pay

## 2020-12-24 ENCOUNTER — Ambulatory Visit
Admission: EM | Admit: 2020-12-24 | Discharge: 2020-12-24 | Disposition: A | Discharge status: Home / Self Care | Payer: BC Managed Care – PPO | Attending: Family Medicine | Admitting: Family Medicine

## 2020-12-24 DIAGNOSIS — J0110 Acute frontal sinusitis, unspecified: Secondary | ICD-10-CM

## 2020-12-24 DIAGNOSIS — J209 Acute bronchitis, unspecified: Secondary | ICD-10-CM

## 2020-12-24 MED ORDER — DOXYCYCLINE HYCLATE 100 MG PO TABS: 100.0000 mg | ORAL_TABLET | Freq: Two times a day (BID) | ORAL | 0 refills | Status: DC

## 2020-12-24 MED ORDER — PROMETHAZINE-DM 6.25-15 MG/5ML PO SYRP: 5.0000 mL | ORAL_SOLUTION | Freq: Four times a day (QID) | ORAL | 0 refills | Status: DC | PRN

## 2020-12-24 NOTE — ED Provider Notes (Signed)
[facility]    CSN: [id number] Arrival date & time: 12/24/20  1003      History   Chief Complaint Chief Complaint  Patient presents with   Nasal Congestion   Cough    HPI Peggy Franklin is a 50 y.o. female.   HPI  Patient presents with concern for possible sinus infection. Onset:>5 days. Endorse facial pressure, frontal headache, ear pressure, copious mucus. Denies fever, N&V. Endorses chest heaviness and mild cough.  Attempted relief with OTC medication without any relief of symptoms. Patient is a smoker. Remainder of Review of Systems negative except as noted in the HPI.   Past Medical History:  Diagnosis Date   History of asplenia 07/10/2019    Patient Active Problem List   Diagnosis Date Noted   Sore throat 09/24/2020   Non-recurrent acute suppurative otitis media of right ear without spontaneous rupture of tympanic membrane 09/24/2020   Seasonal allergic rhinitis due to pollen 09/24/2020   COVID-19 virus infection 07/12/2020   Close exposure to 2019 novel coronavirus 07/10/2020   Diarrhea 05/30/2020   Cough 02/05/2020   Head congestion 02/05/2020   History of asplenia 07/10/2019   Genital warts 04/08/2018   Anxiety 04/08/2018   Anal skin tag 03/28/2015   Gastroesophageal reflux disease without esophagitis 03/07/2015   Postsurgical dumping syndrome 01/20/2013    Past Surgical History:  Procedure Laterality Date   ABDOMINAL HYSTERECTOMY     COLON SURGERY     LAPAROSCOPIC BILATERAL SALPINGO OOPHERECTOMY Bilateral     OB History     Gravida  3   Para  3   Term  2   Preterm  1   AB      Living  2      SAB      IAB      Ectopic      Multiple      Live Births               Home Medications    Prior to Admission medications   Medication Sig Start Date End Date Taking? Authorizing Provider  promethazine-dextromethorphan (PROMETHAZINE-DM) 6.25-15 MG/5ML syrup Take 5 mLs by mouth 4 (four) times daily as needed for cough.  12/24/20  Yes Scot Jun, FNP  albuterol (VENTOLIN HFA) 108 (90 Base) MCG/ACT inhaler INHALE 2 PUFFS INTO THE LUNGS EVERY 6 HOURS AS NEEDED FOR WHEEZING OR SHORTNESS OF BREATH 08/27/20   Kathyrn Drown, MD  benzonatate (TESSALON) 100 MG capsule Take 1 capsule (100 mg total) by mouth 2 (two) times daily as needed for cough. 09/24/20   Chalmers Guest, NP  cetirizine (ZYRTEC) 10 MG tablet TAKE 1 TABLET(10 MG) BY MOUTH DAILY 09/24/20   Chalmers Guest, NP  doxycycline (VIBRA-TABS) 100 MG tablet Take 1 tablet (100 mg total) by mouth 2 (two) times daily. 12/24/20   Scot Jun, FNP  pantoprazole (PROTONIX) 40 MG tablet TAKE 1 TABLET(40 MG) BY MOUTH DAILY 12/12/20   Kathyrn Drown, MD    Family History Family History  Problem Relation Age of Onset   Cancer Mother 96       pancreatic cancer   Cancer Maternal Grandmother        renal and uterine cancer   Heart attack Maternal Grandmother    Emphysema Paternal Grandfather    Stroke Paternal Grandmother    Other Maternal Grandfather        accident   Heart attack Daughter     Social  History Social History   Tobacco Use   Smoking status: Every Day    Packs/day: 0.50    Pack years: 0.00    Types: Cigarettes    Start date: 04/06/2013   Smokeless tobacco: Never   Tobacco comments:    Smokes E cig  Vaping Use   Vaping Use: Never used  Substance Use Topics   Alcohol use: No   Drug use: No     Allergies   Other   Review of Systems Review of Systems Pertinent negatives listed in HPI   Physical Exam Triage Vital Signs ED Triage Vitals  Enc Vitals Group     BP 12/24/20 1015 111/74     Pulse Rate 12/24/20 1015 74     Resp 12/24/20 1015 14     Temp 12/24/20 1015 98 F (36.7 C)     Temp Source 12/24/20 1015 Tympanic     SpO2 12/24/20 1015 96 %     Weight --      Height --      Head Circumference --      Peak Flow --      Pain Score 12/24/20 1017 3     Pain Loc --      Pain Edu? --      Excl. in [place]? --    No data  found.  Updated Vital Signs BP 111/74 (BP Location: Left Arm)   Pulse 74   Temp 98 F (36.7 C) (Tympanic)   Resp 14   SpO2 96%   Visual Acuity Right Eye Distance:   Left Eye Distance:   Bilateral Distance:    Right Eye Near:   Left Eye Near:    Bilateral Near:     Physical Exam  General Appearance:    Alert, cooperative, no distress  HENT:   Normocephalic, ears normal, nares mucosal edema with congestion, rhinorrhea, oropharynx  clear   Eyes:    PERRL, conjunctiva/corneas clear, EOM's intact       Lungs:     Clear to auscultation bilaterally, respirations unlabored  Heart:    Regular rate and rhythm  Neurologic:   Awake, alert, oriented x 3. No apparent focal neurological           defect.      UC Treatments / Results  Labs (all labs ordered are listed, but only abnormal results are displayed) Labs Reviewed - No data to display  EKG   Radiology No results found.  Procedures Procedures (including critical care time)  Medications Ordered in UC Medications - No data to display  Initial Impression / Assessment and Plan / UC Course  I have reviewed the triage vital signs and the nursing notes.  Pertinent labs & imaging results that were available during my care of the patient were reviewed by me and considered in my medical decision making (see chart for details).    Acute sinusitis and acute bronchitis.  Treatment per discharge medication orders.  Strict return precautions given if symptoms worsen or do not improve. Final diagnoses:  Acute non-recurrent frontal sinusitis  Acute bronchitis, unspecified organism   Discharge Instructions   None    ED Prescriptions     Medication Sig Dispense Auth. Provider   doxycycline (VIBRA-TABS) 100 MG tablet Take 1 tablet (100 mg total) by mouth 2 (two) times daily. 20 tablet Scot Jun, FNP   promethazine-dextromethorphan (PROMETHAZINE-DM) 6.25-15 MG/5ML syrup Take 5 mLs by mouth 4 (four) times daily as needed  for cough. [place]  mL Scot Jun, FNP      PDMP not reviewed this encounter.   Scot Jun, FNP 12/28/20 [id number]

## 2020-12-24 NOTE — ED Triage Notes (Signed)
Pt presents with c/o nasal congestion and cough that began Saturday, has had tesslon perles but not helping

## 2021-01-09 ENCOUNTER — Other Ambulatory Visit: Payer: Self-pay | Admitting: Podiatry

## 2021-01-09 ENCOUNTER — Telehealth: Payer: Self-pay | Admitting: *Deleted

## 2021-01-09 ENCOUNTER — Encounter: Payer: Self-pay | Admitting: Podiatry

## 2021-01-09 ENCOUNTER — Other Ambulatory Visit: Payer: Self-pay | Admitting: Family Medicine

## 2021-01-09 DIAGNOSIS — J0110 Acute frontal sinusitis, unspecified: Secondary | ICD-10-CM

## 2021-01-09 DIAGNOSIS — J209 Acute bronchitis, unspecified: Secondary | ICD-10-CM

## 2021-01-09 DIAGNOSIS — M67471 Ganglion, right ankle and foot: Secondary | ICD-10-CM | POA: Diagnosis not present

## 2021-01-09 DIAGNOSIS — D367 Benign neoplasm of other specified sites: Secondary | ICD-10-CM | POA: Diagnosis not present

## 2021-01-09 DIAGNOSIS — M2011 Hallux valgus (acquired), right foot: Secondary | ICD-10-CM | POA: Diagnosis not present

## 2021-01-09 DIAGNOSIS — L989 Disorder of the skin and subcutaneous tissue, unspecified: Secondary | ICD-10-CM | POA: Diagnosis not present

## 2021-01-09 DIAGNOSIS — D2371 Other benign neoplasm of skin of right lower limb, including hip: Secondary | ICD-10-CM | POA: Diagnosis not present

## 2021-01-09 DIAGNOSIS — M7989 Other specified soft tissue disorders: Secondary | ICD-10-CM | POA: Diagnosis not present

## 2021-01-09 DIAGNOSIS — M85871 Other specified disorders of bone density and structure, right ankle and foot: Secondary | ICD-10-CM | POA: Diagnosis not present

## 2021-01-09 MED ORDER — DOXYCYCLINE HYCLATE 100 MG PO TABS: 100.0000 mg | ORAL_TABLET | Freq: Two times a day (BID) | ORAL | 0 refills | Status: DC

## 2021-01-09 MED ORDER — HYDROMORPHONE HCL 4 MG PO TABS: 4.0000 mg | ORAL_TABLET | ORAL | 0 refills | Status: DC | PRN

## 2021-01-09 NOTE — Telephone Encounter (Signed)
That's fine. - Dr. Akima Slaugh

## 2021-01-09 NOTE — Progress Notes (Signed)
PRN postop 

## 2021-01-09 NOTE — Telephone Encounter (Signed)
Patient is calling and wanted to let the physician know that she can not  walk with surgical boot, husband is getting her a scooter from [place], Is it ok?

## 2021-01-10 ENCOUNTER — Other Ambulatory Visit: Payer: Self-pay | Admitting: *Deleted

## 2021-01-10 MED ORDER — PANTOPRAZOLE SODIUM 40 MG PO TBEC: DELAYED_RELEASE_TABLET | ORAL | 0 refills | Status: DC

## 2021-01-10 NOTE — Telephone Encounter (Signed)
Patient wants to know if she can sleep without the boot on. Please advise.

## 2021-01-10 NOTE — Telephone Encounter (Signed)
Yes, okay to sleep without the boot on. - Dr. Amalia Hailey

## 2021-01-13 NOTE — Telephone Encounter (Signed)
Returned call to patient to give information for not wearing her boot while sleeping ,verbalizing understanding ,wanted to let the doctor know that she has been up walking a little , rotating from boot to a surgical shoe(previously purchased), doing well, has only taken one pain pill since surgery.

## 2021-01-15 ENCOUNTER — Other Ambulatory Visit: Payer: Self-pay

## 2021-01-15 ENCOUNTER — Ambulatory Visit (INDEPENDENT_AMBULATORY_CARE_PROVIDER_SITE_OTHER): Payer: BC Managed Care – PPO

## 2021-01-15 ENCOUNTER — Ambulatory Visit (INDEPENDENT_AMBULATORY_CARE_PROVIDER_SITE_OTHER): Payer: BC Managed Care – PPO | Admitting: Podiatry

## 2021-01-15 DIAGNOSIS — Z9889 Other specified postprocedural states: Secondary | ICD-10-CM | POA: Diagnosis not present

## 2021-01-15 NOTE — Progress Notes (Signed)
   Subjective:  Patient presents today status post excision of benign soft tissue mass with exostectomy right foot. DOS: 01/10/2021.  Patient states that she is feeling much better.  She only had to take 1 pain medication.  She has been weightbearing in the cam boot as instructed.  No new complaints at this time  Past Medical History:  Diagnosis Date   History of asplenia 07/10/2019      Objective/Physical Exam Neurovascular status intact.  Skin incisions appear to be well coapted with staples intact. No sign of infectious process noted. No dehiscence. No active bleeding noted.  Negative for any significant edema  Radiographic Exam:  Osteotomy of the fifth metatarsal tubercle appears to be stable with routine healing.  Assessment: 1. s/p excision of benign soft tissue mass with exostectomy fifth metatarsal tubercle right foot. DOS: 01/10/2021   Plan of Care:  1. Patient was evaluated. X-rays reviewed 2.  Dressings changed today.   3.  Patient finds a cam boot very uncomfortable.  Discontinue cam boot. 4.  Postsurgical shoe dispensed.  Weightbearing as tolerated 5.  Return to clinic in 2 weeks for staple removal   Edrick Kins, DPM [facility]  Dr. Edrick Kins, DPM    [address], [place] [address]                Office [phone]  Fax [phone]

## 2021-01-21 ENCOUNTER — Telehealth: Payer: Self-pay | Admitting: Podiatry

## 2021-01-21 NOTE — Telephone Encounter (Signed)
Yes.  This is most likely postoperative pain and walking abnormally after surgery.-Dr. Amalia Hailey

## 2021-01-21 NOTE — Telephone Encounter (Signed)
Pt called nurse line stating she is experiencing pain in her arches and also feels a pulling sensation. She would like to know if that is normal. Please advise.

## 2021-01-27 ENCOUNTER — Other Ambulatory Visit: Payer: Self-pay

## 2021-01-27 ENCOUNTER — Ambulatory Visit (INDEPENDENT_AMBULATORY_CARE_PROVIDER_SITE_OTHER): Payer: BC Managed Care – PPO | Admitting: Podiatry

## 2021-01-27 DIAGNOSIS — Z9889 Other specified postprocedural states: Secondary | ICD-10-CM

## 2021-01-28 DIAGNOSIS — M79676 Pain in unspecified toe(s): Secondary | ICD-10-CM

## 2021-02-04 NOTE — Progress Notes (Signed)
   Subjective:  Patient presents today status post excision of benign soft tissue mass with exostectomy right foot. DOS: 01/10/2021.  Patient continues to do well.  She says that it is hard to apply pressure on her foot because of the pain.  Overall doing better with no new complaints at this time  Past Medical History:  Diagnosis Date   History of asplenia 07/10/2019      Objective/Physical Exam Neurovascular status intact.  Skin incisions appear to be well coapted with staples intact. No sign of infectious process noted. No dehiscence. No active bleeding noted.  Negative for any significant edema   Assessment: 1. s/p excision of benign soft tissue mass with exostectomy fifth metatarsal tubercle right foot. DOS: 01/10/2021   Plan of Care:  1. Patient was evaluated.  2.  Staples removed today 3.  Continue minimal weightbearing in the postsurgical shoe 4.  Return to clinic in 4 weeks for follow-up x-ray  Edrick Kins, DPM [facility]  Dr. Edrick Kins, DPM    [address], [place] [address]                Office [phone]  Fax [phone]

## 2021-02-05 DIAGNOSIS — M79676 Pain in unspecified toe(s): Secondary | ICD-10-CM

## 2021-02-10 ENCOUNTER — Encounter: Payer: BC Managed Care – PPO | Admitting: Podiatry

## 2021-02-18 ENCOUNTER — Other Ambulatory Visit: Payer: Self-pay

## 2021-02-18 MED ORDER — PANTOPRAZOLE SODIUM 40 MG PO TBEC: DELAYED_RELEASE_TABLET | ORAL | 0 refills | Status: DC

## 2021-02-20 ENCOUNTER — Other Ambulatory Visit: Payer: Self-pay | Admitting: Family Medicine

## 2021-02-25 ENCOUNTER — Other Ambulatory Visit: Payer: Self-pay

## 2021-02-25 ENCOUNTER — Ambulatory Visit: Payer: BC Managed Care – PPO | Admitting: Family Medicine

## 2021-02-25 VITALS — BP 105/67 | HR 73 | Ht 61.0 in | Wt 129.6 lb

## 2021-02-25 DIAGNOSIS — K219 Gastro-esophageal reflux disease without esophagitis: Secondary | ICD-10-CM | POA: Diagnosis not present

## 2021-02-25 MED ORDER — PANTOPRAZOLE SODIUM 40 MG PO TBEC: DELAYED_RELEASE_TABLET | ORAL | 1 refills | Status: DC

## 2021-02-25 NOTE — Progress Notes (Signed)
   Subjective:    Patient ID: Peggy Franklin, female    DOB: 1971/02/12, 50 y.o.   MRN: [id number]  HPI  Patient arrives for a follow up on Reflux. Patient states she is doing well on Protonix and is requesting refills. Jerrye Bushy doing well no dysphagia Doing well with meds No problems Requesting refills Review of Systems     Objective:   Physical Exam Lungs clear heart regular abdomen soft extremities no edema       Assessment & Plan:  GERD Recommend continuing medication PPI After 6 months if doing well recommend reducing it to prescription Pepcid Follow-up 6 months or send [place] a MyChart message at that time

## 2021-02-26 ENCOUNTER — Ambulatory Visit (INDEPENDENT_AMBULATORY_CARE_PROVIDER_SITE_OTHER): Payer: BC Managed Care – PPO | Admitting: Podiatry

## 2021-02-26 ENCOUNTER — Ambulatory Visit (INDEPENDENT_AMBULATORY_CARE_PROVIDER_SITE_OTHER): Payer: BC Managed Care – PPO

## 2021-02-26 ENCOUNTER — Encounter: Payer: Self-pay | Admitting: Podiatry

## 2021-02-26 DIAGNOSIS — Z9889 Other specified postprocedural states: Secondary | ICD-10-CM | POA: Diagnosis not present

## 2021-02-26 MED ORDER — CYCLOBENZAPRINE HCL 10 MG PO TABS: 10.0000 mg | ORAL_TABLET | Freq: Three times a day (TID) | ORAL | 1 refills | Status: DC | PRN

## 2021-02-27 ENCOUNTER — Encounter: Payer: Self-pay | Admitting: Podiatry

## 2021-03-07 ENCOUNTER — Telehealth: Payer: Self-pay | Admitting: Podiatry

## 2021-03-07 NOTE — Telephone Encounter (Signed)
That's fine

## 2021-03-07 NOTE — Telephone Encounter (Signed)
Peggy Franklin said she had to leave work at 3:00am this morning because her feet were hurting so bad. She will reach back out to [facility] to see if perhaps she could get paperwork for intermittent FMLA. She said she needs to ease back into 12hours.

## 2021-03-11 NOTE — Progress Notes (Signed)
   Subjective:  Patient presents today status post excision of benign soft tissue mass with exostectomy right foot. DOS: 01/10/2021.  Patient states that she is doing very well.  Overall she is doing much better and she has minimal pain to the foot.  She does have some intermittent foot cramping but overall significant provement.  Past Medical History:  Diagnosis Date   History of asplenia 07/10/2019      Objective/Physical Exam Neurovascular status intact.  Skin incisions appear to be well coapted and healed. No sign of infectious process noted. No dehiscence. No active bleeding noted.  Negative for any significant edema   Assessment: 1. s/p excision of benign soft tissue mass with exostectomy fifth metatarsal tubercle right foot. DOS: 01/10/2021 2.  Intermittent foot cramping   Plan of Care:  1. Patient was evaluated.  2.  Overall the patient is doing very well.  She may resume full activity no restrictions 3.  Prescription for Flexeril 10 mg daily as needed foot cramping intermittent 4.  Return to clinic as needed  Edrick Kins, DPM [facility]  Dr. Edrick Kins, DPM    [address], [place] [address]                Office [phone]  Fax [phone]

## 2021-06-25 ENCOUNTER — Other Ambulatory Visit: Payer: Self-pay

## 2021-06-25 ENCOUNTER — Ambulatory Visit
Admission: EM | Admit: 2021-06-25 | Discharge: 2021-06-25 | Disposition: A | Discharge status: Home / Self Care | Payer: BC Managed Care – PPO | Attending: Family Medicine | Admitting: Family Medicine

## 2021-06-25 ENCOUNTER — Telehealth: Payer: Self-pay | Admitting: Family Medicine

## 2021-06-25 DIAGNOSIS — J069 Acute upper respiratory infection, unspecified: Secondary | ICD-10-CM | POA: Diagnosis not present

## 2021-06-25 DIAGNOSIS — R110 Nausea: Secondary | ICD-10-CM

## 2021-06-25 DIAGNOSIS — R52 Pain, unspecified: Secondary | ICD-10-CM

## 2021-06-25 DIAGNOSIS — Z20828 Contact with and (suspected) exposure to other viral communicable diseases: Secondary | ICD-10-CM

## 2021-06-25 MED ORDER — ONDANSETRON 4 MG PO TBDP: 4.0000 mg | ORAL_TABLET | Freq: Three times a day (TID) | ORAL | 0 refills | Status: DC | PRN

## 2021-06-25 MED ORDER — PROMETHAZINE-DM 6.25-15 MG/5ML PO SYRP: 5.0000 mL | ORAL_SOLUTION | Freq: Four times a day (QID) | ORAL | 0 refills | Status: DC | PRN

## 2021-06-25 MED ORDER — OSELTAMIVIR PHOSPHATE 75 MG PO CAPS: 75.0000 mg | ORAL_CAPSULE | Freq: Two times a day (BID) | ORAL | 0 refills | Status: DC

## 2021-06-25 NOTE — Telephone Encounter (Signed)
Patient is requesting muscle relaxer for back pain. [facility]

## 2021-06-25 NOTE — ED Provider Notes (Signed)
[facility]    CSN: [id number] Arrival date & time: 06/25/21  0944      History   Chief Complaint Chief Complaint  Patient presents with   Cough    Low back pain and cough    HPI Peggy Franklin is a 51 y.o. female.   Presenting today with 4 to 5-day history of progressively worsening cough, congestion, headache, generalized body aches, fatigue and malaise.  Denies known fever, chest pain, shortness of breath, vomiting, diarrhea but has had significant nausea and decreased appetite.  Trying icy hot, heating pads, compresses, cold and flu medications with no relief.  Recent direct contact with flu positive individual.   Past Medical History:  Diagnosis Date   History of asplenia 07/10/2019    Patient Active Problem List   Diagnosis Date Noted   Sore throat 09/24/2020   Non-recurrent acute suppurative otitis media of right ear without spontaneous rupture of tympanic membrane 09/24/2020   Seasonal allergic rhinitis due to pollen 09/24/2020   COVID-19 virus infection 07/12/2020   Close exposure to 2019 novel coronavirus 07/10/2020   Diarrhea 05/30/2020   Cough 02/05/2020   Head congestion 02/05/2020   History of asplenia 07/10/2019   Genital warts 04/08/2018   Anxiety 04/08/2018   Anal skin tag 03/28/2015   Gastroesophageal reflux disease without esophagitis 03/07/2015   Postsurgical dumping syndrome 01/20/2013    Past Surgical History:  Procedure Laterality Date   ABDOMINAL HYSTERECTOMY     COLON SURGERY     LAPAROSCOPIC BILATERAL SALPINGO OOPHERECTOMY Bilateral     OB History     Gravida  3   Para  3   Term  2   Preterm  1   AB      Living  2      SAB      IAB      Ectopic      Multiple      Live Births               Home Medications    Prior to Admission medications   Medication Sig Start Date End Date Taking? Authorizing Provider  ondansetron (ZOFRAN-ODT) 4 MG disintegrating tablet Take 1 tablet (4 mg total) by mouth  every 8 (eight) hours as needed for nausea or vomiting. 06/25/21  Yes Volney American, PA-C  oseltamivir (TAMIFLU) 75 MG capsule Take 1 capsule (75 mg total) by mouth every 12 (twelve) hours. 06/25/21  Yes Volney American, PA-C  promethazine-dextromethorphan (PROMETHAZINE-DM) 6.25-15 MG/5ML syrup Take 5 mLs by mouth 4 (four) times daily as needed. 06/25/21  Yes Volney American, PA-C  albuterol (VENTOLIN HFA) 108 (90 Base) MCG/ACT inhaler INHALE 2 PUFFS INTO THE LUNGS EVERY 6 HOURS AS NEEDED FOR WHEEZING OR SHORTNESS OF BREATH 08/27/20   Kathyrn Drown, MD  cetirizine (ZYRTEC) 10 MG tablet TAKE 1 TABLET(10 MG) BY MOUTH DAILY 09/24/20   Chalmers Guest, NP  cyclobenzaprine (FLEXERIL) 10 MG tablet Take 1 tablet (10 mg total) by mouth 3 (three) times daily as needed for muscle spasms. 02/26/21   Edrick Kins, DPM  pantoprazole (PROTONIX) 40 MG tablet TAKE 1 TABLET(40 MG) BY MOUTH DAILY 02/25/21   Kathyrn Drown, MD    Family History Family History  Problem Relation Age of Onset   Cancer Mother 55       pancreatic cancer   Cancer Maternal Grandmother        renal and uterine cancer   Heart attack  Maternal Grandmother    Emphysema Paternal Grandfather    Stroke Paternal Grandmother    Other Maternal Grandfather        accident   Heart attack Daughter     Social History Social History   Tobacco Use   Smoking status: Every Day    Packs/day: 0.50    Types: Cigarettes    Start date: 04/06/2013   Smokeless tobacco: Never   Tobacco comments:    Smokes E cig  Vaping Use   Vaping Use: Never used  Substance Use Topics   Alcohol use: No   Drug use: No     Allergies   Other   Review of Systems Review of Systems Per HPI  Physical Exam Triage Vital Signs ED Triage Vitals  Enc Vitals Group     BP 06/25/21 1144 (!) 95/57     Pulse Rate 06/25/21 1144 80     Resp 06/25/21 1144 16     Temp 06/25/21 1144 98.7 F (37.1 C)     Temp Source 06/25/21 1144 Oral     SpO2  06/25/21 1144 98 %     Weight --      Height --      Head Circumference --      Peak Flow --      Pain Score 06/25/21 1140 9     Pain Loc --      Pain Edu? --      Excl. in [place]? --    No data found.  Updated Vital Signs BP (!) 95/57 (BP Location: Left Arm)    Pulse 80    Temp 98.7 F (37.1 C) (Oral)    Resp 16    SpO2 98%   Visual Acuity Right Eye Distance:   Left Eye Distance:   Bilateral Distance:    Right Eye Near:   Left Eye Near:    Bilateral Near:     Physical Exam Vitals and nursing note reviewed.  Constitutional:      Appearance: Normal appearance.  HENT:     Head: Atraumatic.     Right Ear: Tympanic membrane and external ear normal.     Left Ear: Tympanic membrane and external ear normal.     Nose: Rhinorrhea present.     Mouth/Throat:     Mouth: Mucous membranes are moist.     Pharynx: Posterior oropharyngeal erythema present.  Eyes:     Extraocular Movements: Extraocular movements intact.     Conjunctiva/sclera: Conjunctivae normal.  Cardiovascular:     Rate and Rhythm: Normal rate and regular rhythm.     Heart sounds: Normal heart sounds.  Pulmonary:     Effort: Pulmonary effort is normal.     Breath sounds: Normal breath sounds. No wheezing or rales.  Abdominal:     General: Bowel sounds are normal. There is no distension.     Palpations: Abdomen is soft.     Tenderness: There is no abdominal tenderness. There is no guarding.  Musculoskeletal:        General: Normal range of motion.     Cervical back: Normal range of motion and neck supple.  Skin:    General: Skin is warm and dry.  Neurological:     Mental Status: She is alert and oriented to person, place, and time.  Psychiatric:        Mood and Affect: Mood normal.        Thought Content: Thought content normal.     UC  Treatments / Results  Labs (all labs ordered are listed, but only abnormal results are displayed) Labs Reviewed  COVID-19, FLU A+B NAA    EKG   Radiology No  results found.  Procedures Procedures (including critical care time)  Medications Ordered in UC Medications - No data to display  Initial Impression / Assessment and Plan / UC Course  I have reviewed the triage vital signs and the nursing notes.  Pertinent labs & imaging results that were available during my care of the patient were reviewed by me and considered in my medical decision making (see chart for details).     Mildly hypertensive in triage, otherwise vital signs reassuring.  Suspect influenza given exposure and symptoms, COVID and flu testing pending, will treat with Tamiflu despite being out of window in the meantime due to ongoing and severe symptoms.  Zofran, Phenergan DM sent for symptomatic benefit.  Discussed over-the-counter supportive medications and home care additionally.  Work note given.  Return for acutely worsening symptoms.  Final Clinical Impressions(s) / UC Diagnoses   Final diagnoses:  Exposure to the flu  Viral URI with cough  Generalized body aches  Nausea without vomiting   Discharge Instructions   None    ED Prescriptions     Medication Sig Dispense Auth. Provider   oseltamivir (TAMIFLU) 75 MG capsule Take 1 capsule (75 mg total) by mouth every 12 (twelve) hours. 10 capsule Volney American, PA-C   ondansetron (ZOFRAN-ODT) 4 MG disintegrating tablet Take 1 tablet (4 mg total) by mouth every 8 (eight) hours as needed for nausea or vomiting. 20 tablet Volney American, [place]   promethazine-dextromethorphan (PROMETHAZINE-DM) 6.25-15 MG/5ML syrup Take 5 mLs by mouth 4 (four) times daily as needed. 100 mL Volney American, [place]      PDMP not reviewed this encounter.   Volney American, [place] 06/25/21 1224

## 2021-06-25 NOTE — ED Triage Notes (Signed)
Patient states that on Friday her lower back begin to hurt.   Patient states she has a cough, nose congestion and bad headache.   Patient states she was exposed to the Flu on Thursday night.   Patient states she has tried Icy hot, heating pad, pain patch, hot and cold compresses.   Denies Injury  Denies Fever

## 2021-06-26 LAB — COVID-19, FLU A+B NAA
Influenza A, NAA: NOT DETECTED
Influenza B, NAA: NOT DETECTED
SARS-CoV-2, NAA: NOT DETECTED

## 2021-06-26 MED ORDER — DICLOFENAC SODIUM 75 MG PO TBEC: DELAYED_RELEASE_TABLET | ORAL | 0 refills | Status: DC

## 2021-06-26 NOTE — Telephone Encounter (Signed)
Back spasms and back pain can take days to weeks to get better Can do prescription anti inflammatory Stop OTC advil May use Voltaren 75 one bid for 10 days May use flexeril at home- caution drowsiness If doesn't get better then follow up OV

## 2021-06-26 NOTE — Telephone Encounter (Signed)
Telephone call-voicemail not set up ?

## 2021-06-26 NOTE — Telephone Encounter (Signed)
Patient informed of md message and instructions. Verbalized understanding. Rx sent

## 2021-06-26 NOTE — Telephone Encounter (Signed)
Patient advised of message. She states she does have some Flexeril left. Patient also says she has been taking Advil and using the heating pad, but not helping.

## 2021-06-26 NOTE — Telephone Encounter (Signed)
1- muscle relaxers do very little for back pain Typically they cause drowsiness 2- stretches and anti-inflamatories are best and physical therapy 3- looks like she got 90 flexerils in sept ? Have any of these left?

## 2021-07-03 ENCOUNTER — Other Ambulatory Visit: Payer: Self-pay | Admitting: Family Medicine

## 2021-07-09 ENCOUNTER — Ambulatory Visit: Payer: BC Managed Care – PPO | Admitting: Nurse Practitioner

## 2021-07-18 ENCOUNTER — Other Ambulatory Visit (HOSPITAL_COMMUNITY): Payer: Self-pay | Admitting: Family Medicine

## 2021-07-18 ENCOUNTER — Telehealth: Payer: Self-pay | Admitting: Nurse Practitioner

## 2021-07-18 DIAGNOSIS — Z79899 Other long term (current) drug therapy: Secondary | ICD-10-CM

## 2021-07-18 DIAGNOSIS — Z13220 Encounter for screening for lipoid disorders: Secondary | ICD-10-CM

## 2021-07-18 DIAGNOSIS — Z1231 Encounter for screening mammogram for malignant neoplasm of breast: Secondary | ICD-10-CM

## 2021-07-18 DIAGNOSIS — Z130 Encounter for screening for diseases of the blood and blood-forming organs and certain disorders involving the immune mechanism: Secondary | ICD-10-CM

## 2021-07-18 NOTE — Telephone Encounter (Signed)
Patient has physical on 2/17 and needing labs done

## 2021-07-20 NOTE — Telephone Encounter (Signed)
CBC, lipid, liver, metabolic 7

## 2021-07-21 ENCOUNTER — Ambulatory Visit (HOSPITAL_COMMUNITY)
Admission: RE | Admit: 2021-07-21 | Discharge: 2021-07-21 | Disposition: A | Discharge status: Home / Self Care | Payer: BC Managed Care – PPO | Source: Ambulatory Visit | Attending: Family Medicine | Admitting: Family Medicine

## 2021-07-21 ENCOUNTER — Other Ambulatory Visit: Payer: Self-pay

## 2021-07-21 DIAGNOSIS — Z1231 Encounter for screening mammogram for malignant neoplasm of breast: Secondary | ICD-10-CM | POA: Diagnosis not present

## 2021-07-21 NOTE — Telephone Encounter (Signed)
Voicemail not set up.

## 2021-07-21 NOTE — Telephone Encounter (Signed)
Blood work ordered in Epic. Patient notified. 

## 2021-07-25 ENCOUNTER — Ambulatory Visit: Payer: BC Managed Care – PPO | Admitting: Podiatry

## 2021-08-01 DIAGNOSIS — Z130 Encounter for screening for diseases of the blood and blood-forming organs and certain disorders involving the immune mechanism: Secondary | ICD-10-CM | POA: Diagnosis not present

## 2021-08-01 DIAGNOSIS — Z13220 Encounter for screening for lipoid disorders: Secondary | ICD-10-CM | POA: Diagnosis not present

## 2021-08-01 DIAGNOSIS — Z79899 Other long term (current) drug therapy: Secondary | ICD-10-CM | POA: Diagnosis not present

## 2021-08-02 LAB — CBC WITH DIFFERENTIAL/PLATELET
Basophils Absolute: 0 10*3/uL (ref 0.0–0.2)
Basos: 0 %
EOS (ABSOLUTE): 0 10*3/uL (ref 0.0–0.4)
Eos: 1 %
Hematocrit: 39.4 % (ref 34.0–46.6)
Hemoglobin: 13.6 g/dL (ref 11.1–15.9)
Immature Grans (Abs): 0 10*3/uL (ref 0.0–0.1)
Immature Granulocytes: 0 %
Lymphocytes Absolute: 1.8 10*3/uL (ref 0.7–3.1)
Lymphs: 32 %
MCH: 31.1 pg (ref 26.6–33.0)
MCHC: 34.5 g/dL (ref 31.5–35.7)
MCV: 90 fL (ref 79–97)
Monocytes Absolute: 0.5 10*3/uL (ref 0.1–0.9)
Monocytes: 8 %
Neutrophils Absolute: 3.3 10*3/uL (ref 1.4–7.0)
Neutrophils: 59 %
Platelets: 193 10*3/uL (ref 150–450)
RBC: 4.37 x10E6/uL (ref 3.77–5.28)
RDW: 12.1 % (ref 11.7–15.4)
WBC: 5.7 10*3/uL (ref 3.4–10.8)

## 2021-08-02 LAB — BASIC METABOLIC PANEL
BUN/Creatinine Ratio: 19 (ref 9–23)
BUN: 11 mg/dL (ref 6–24)
CO2: 26 mmol/L (ref 20–29)
Calcium: 9.3 mg/dL (ref 8.7–10.2)
Chloride: 102 mmol/L (ref 96–106)
Creatinine, Ser: 0.59 mg/dL (ref 0.57–1.00)
Glucose: 65 mg/dL — ABNORMAL LOW (ref 70–99)
Potassium: 3.9 mmol/L (ref 3.5–5.2)
Sodium: 140 mmol/L (ref 134–144)
eGFR: 109 mL/min/{1.73_m2} (ref >59)

## 2021-08-02 LAB — HEPATIC FUNCTION PANEL
ALT: 9 IU/L (ref 0–32)
AST: 13 IU/L (ref 0–40)
Albumin: 4.6 g/dL (ref 3.8–4.9)
Alkaline Phosphatase: 70 IU/L (ref 44–121)
Bilirubin Total: 0.4 mg/dL (ref 0.0–1.2)
Bilirubin, Direct: 0.11 mg/dL (ref 0.00–0.40)
Total Protein: 6.6 g/dL (ref 6.0–8.5)

## 2021-08-02 LAB — LIPID PANEL
Chol/HDL Ratio: 3.1 ratio (ref 0.0–4.4)
Cholesterol, Total: 162 mg/dL (ref 100–199)
HDL: 52 mg/dL (ref >39)
LDL Chol Calc (NIH): 97 mg/dL (ref 0–99)
Triglycerides: 67 mg/dL (ref 0–149)
VLDL Cholesterol Cal: 13 mg/dL (ref 5–40)

## 2021-08-03 ENCOUNTER — Encounter: Payer: Self-pay | Admitting: Family Medicine

## 2021-08-04 ENCOUNTER — Telehealth: Payer: Self-pay | Admitting: Family Medicine

## 2021-08-04 DIAGNOSIS — H1032 Unspecified acute conjunctivitis, left eye: Secondary | ICD-10-CM | POA: Diagnosis not present

## 2021-08-04 NOTE — Telephone Encounter (Signed)
Pt eye starting swelling at work last night and her supervisor sent her home this morning at 4 am. She has an appt with [facility] in [place] at 1:00 pm. She sent pictures of her eye through Mychart so the dr can see.    [phone]

## 2021-08-06 ENCOUNTER — Ambulatory Visit (INDEPENDENT_AMBULATORY_CARE_PROVIDER_SITE_OTHER): Payer: BC Managed Care – PPO | Admitting: Nurse Practitioner

## 2021-08-06 ENCOUNTER — Encounter: Payer: Self-pay | Admitting: Nurse Practitioner

## 2021-08-06 ENCOUNTER — Other Ambulatory Visit: Payer: Self-pay

## 2021-08-06 VITALS — BP 118/72 | HR 78 | Temp 97.2°F | Wt 129.0 lb

## 2021-08-06 DIAGNOSIS — H5789 Other specified disorders of eye and adnexa: Secondary | ICD-10-CM

## 2021-08-06 DIAGNOSIS — H1033 Unspecified acute conjunctivitis, bilateral: Secondary | ICD-10-CM

## 2021-08-06 MED ORDER — MONTELUKAST SODIUM 10 MG PO TABS: 10.0000 mg | ORAL_TABLET | Freq: Every day | ORAL | 0 refills | Status: DC

## 2021-08-06 NOTE — Progress Notes (Addendum)
° °  Subjective:    Patient ID: Peggy Franklin, female    DOB: 04-Jan-1971, 51 y.o.   MRN: [id number]  HPI   Patient presents with eye redness, eye drainage, eye pain, and swelling to the left eye that started on Sunday (x4 days ago).  Patient states that her right eye is also starting to be puffy and red.  Patient describes eye pain as feeling of sandpaper against her eyes.  Patient admits to light sensitivity and some blurry vision.  Patient was seen by eye doctor 3 days ago and was prescribed moxifloxacin and allergy eyedrops.  Left eye going to R eye, swelling and drainage and pain since Sunday  Patient denies any runny nose, sore throat, fever, chills, body aches, postnasal drip.  Review of Systems  Eyes:  Positive for photophobia, pain, discharge, redness, itching and visual disturbance.      Objective:   Physical Exam Constitutional:      Appearance: Normal appearance. She is normal weight.  Eyes:     General:        Right eye: Discharge present.        Left eye: Discharge present.    Extraocular Movements:     Right eye: Normal extraocular motion and no nystagmus.     Left eye: Normal extraocular motion and no nystagmus.     Conjunctiva/sclera:     Right eye: Right conjunctiva is injected. Exudate present. No chemosis or hemorrhage.    Left eye: Left conjunctiva is injected. Exudate present. No chemosis or hemorrhage.    Comments: Left upper eyelid swollen and slightly erythemic.  Right upper eyelid slightly swollen, no erythema.  Neurological:     Mental Status: She is alert.          Assessment & Plan:   1. Acute conjunctivitis of both eyes, unspecified acute conjunctivitis type -Likely bacterial infection -Continue to use antibiotics as prescribed by eye doctor, Dr. Hassell Done and [facility] -Patient unable to use steroids due to allergy.  Singulair prescribed as stated to manage swelling. - montelukast (SINGULAIR) 10 MG tablet; Take 1 tablet (10 mg total) by  mouth at bedtime.  Dispense: 30 tablet; Refill: 0 -Follow-up with eye doctor if eyes not better or worse.  It should be noted that I was consulted then to see patient.  I agree that there is conjunctivitis going on and for the drops.  As for the edema above the eye on the left side I believe this is more allergy related.  Singulair hopefully will help as well as OTC Benadryl if needed.  Patient was informed that if she has progressive troubles she would need to see the eye doctor-Dr. Sallee Lange

## 2021-08-08 ENCOUNTER — Other Ambulatory Visit: Payer: Self-pay

## 2021-08-08 ENCOUNTER — Ambulatory Visit (INDEPENDENT_AMBULATORY_CARE_PROVIDER_SITE_OTHER): Payer: BC Managed Care – PPO | Admitting: Nurse Practitioner

## 2021-08-08 ENCOUNTER — Encounter: Payer: Self-pay | Admitting: Nurse Practitioner

## 2021-08-08 VITALS — BP 103/70 | HR 67 | Temp 98.1°F | Ht 61.0 in | Wt 131.0 lb

## 2021-08-08 DIAGNOSIS — Z130 Encounter for screening for diseases of the blood and blood-forming organs and certain disorders involving the immune mechanism: Secondary | ICD-10-CM | POA: Diagnosis not present

## 2021-08-08 DIAGNOSIS — Z114 Encounter for screening for human immunodeficiency virus [HIV]: Secondary | ICD-10-CM | POA: Diagnosis not present

## 2021-08-08 DIAGNOSIS — Z1159 Encounter for screening for other viral diseases: Secondary | ICD-10-CM | POA: Diagnosis not present

## 2021-08-08 DIAGNOSIS — B309 Viral conjunctivitis, unspecified: Secondary | ICD-10-CM

## 2021-08-08 DIAGNOSIS — Z13220 Encounter for screening for lipoid disorders: Secondary | ICD-10-CM | POA: Diagnosis not present

## 2021-08-08 DIAGNOSIS — Z1211 Encounter for screening for malignant neoplasm of colon: Secondary | ICD-10-CM

## 2021-08-08 DIAGNOSIS — Z01419 Encounter for gynecological examination (general) (routine) without abnormal findings: Secondary | ICD-10-CM | POA: Diagnosis not present

## 2021-08-08 NOTE — Telephone Encounter (Signed)
Patient was seen here in the office

## 2021-08-08 NOTE — Progress Notes (Signed)
° °Subjective:  ° ° Patient ID: Peggy Franklin, female    DOB: 04/14/1971, 50 y.o.   MRN: [id number] ° °HPI ° °Presents today for her annual physical. Daily walking and actively working. Eats a well-balanced diet. 1/2 PDD smoker for 32 years. Denies vaping  and alcohol products. Hysterectomy with BSO. Same sexual partner for 15 years. Defers pelvic exam at this time.  Had two Covid vaccines without booster due to side effects from the second vaccine. Eye and dental exams to be completed this year per patient. Report eye symptoms improving from prior visit. Mammogram 07/21/21.  ° °Review of Systems  °Constitutional:  Negative for activity change, appetite change and fatigue.  °HENT:  Negative for ear discharge, ear pain, sore throat and trouble swallowing.   °Eyes:  Negative for pain, itching and visual disturbance.  °Respiratory:  Negative for cough, chest tightness, shortness of breath and wheezing.   °Cardiovascular:  Negative for chest pain.  °Gastrointestinal:  Negative for abdominal distention, abdominal pain, blood in stool, constipation, diarrhea, nausea and vomiting.  °Genitourinary:  Negative for difficulty urinating, dysuria, enuresis, frequency, genital sores, pelvic pain, urgency and vaginal discharge.  °Neurological:  Negative for facial asymmetry, weakness and headaches.  ° °   °Objective:  ° Physical Exam °Constitutional:   °   General: She is not in acute distress. °   Appearance: She is well-developed.  °Eyes:  °   General:     °   Right eye: No discharge.     °   Left eye: No discharge.  °   Comments: Conjunctivae mildly injected more on the left than right. Slight upper and lower eyelid edema. Minimal tenderness. No erythema.  No preauricular adenopathy.  °Neck:  °   Thyroid: No thyromegaly.  °   Trachea: No tracheal deviation.  °   Comments: Thyroid non tender to palpation. No mass or goiter noted.  °Cardiovascular:  °   Rate and Rhythm: Normal rate and regular rhythm.  °   Heart sounds: Normal  heart sounds. No murmur heard. °Pulmonary:  °   Effort: Pulmonary effort is normal.  °   Breath sounds: Normal breath sounds.  °Chest:  °Breasts: °   Right: No swelling, inverted nipple, mass, skin change or tenderness.  °   Left: No swelling, inverted nipple, mass, skin change or tenderness.  °Abdominal:  °   General: There is no distension.  °   Palpations: Abdomen is soft. There is no mass.  °   Tenderness: There is no abdominal tenderness.  °Genitourinary: °   Comments: Defers pelvic exam. Denies any rash, itching or lesions.  °Musculoskeletal:  °   Cervical back: Normal range of motion and neck supple.  °Lymphadenopathy:  °   Cervical: No cervical adenopathy.  °   Upper Body:  °   Right upper body: No supraclavicular, axillary or pectoral adenopathy.  °   Left upper body: No supraclavicular, axillary or pectoral adenopathy.  °Skin: °   General: Skin is warm and dry.  °   Findings: No rash.  °Neurological:  °   Mental Status: She is alert and oriented to person, place, and time.  °Psychiatric:     °   Mood and Affect: Mood normal.     °   Behavior: Behavior normal.     °   Thought Content: Thought content normal.     °   Judgment: Judgment normal.  ° °Today's Vitals  ° 08/08/21 0825  °  0825  BP: 103/70  Pulse: 67  Temp: 98.1 F (36.7 C)  SpO2: 99%  Weight: 131 lb (59.4 kg)  Height: 5' 1" (1.549 m)   Body mass index is 24.75 kg/m.   Depression screen [facility] 2/9 02/25/2021  Decreased Interest 0  Down, Depressed, Hopeless 0  PHQ - 2 Score 0  Altered sleeping -  Tired, decreased energy -  Change in appetite -  Feeling bad or failure about yourself  -  Trouble concentrating -  Moving slowly or fidgety/restless -  Suicidal thoughts -  PHQ-9 Score -  Difficult doing work/chores -   Results for orders placed or performed in visit on 07/18/21  CBC with Differential  Result Value Ref Range   WBC 5.7 3.4 - 10.8 x10E3/uL   RBC 4.37 3.77 - 5.28 x10E6/uL   Hemoglobin 13.6 11.1 - 15.9 g/dL   Hematocrit 39.4  34.0 - 46.6 %   MCV 90 79 - 97 fL   MCH 31.1 26.6 - 33.0 pg   MCHC 34.5 31.5 - 35.7 g/dL   RDW 12.1 11.7 - 15.4 %   Platelets 193 150 - 450 x10E3/uL   Neutrophils 59 Not Estab. %   Lymphs 32 Not Estab. %   Monocytes 8 Not Estab. %   Eos 1 Not Estab. %   Basos 0 Not Estab. %   Neutrophils Absolute 3.3 1.4 - 7.0 x10E3/uL   Lymphocytes Absolute 1.8 0.7 - 3.1 x10E3/uL   Monocytes Absolute 0.5 0.1 - 0.9 x10E3/uL   EOS (ABSOLUTE) 0.0 0.0 - 0.4 x10E3/uL   Basophils Absolute 0.0 0.0 - 0.2 x10E3/uL   Immature Granulocytes 0 Not Estab. %   Immature Grans (Abs) 0.0 0.0 - 0.1 x10E3/uL  Lipid Profile  Result Value Ref Range   Cholesterol, Total 162 100 - 199 mg/dL   Triglycerides 67 0 - 149 mg/dL   HDL 52 >39 mg/dL   VLDL Cholesterol Cal 13 5 - 40 mg/dL   LDL Chol Calc (NIH) 97 0 - 99 mg/dL   Chol/HDL Ratio 3.1 0.0 - 4.4 ratio  Hepatic function panel  Result Value Ref Range   Total Protein 6.6 6.0 - 8.5 g/dL   Albumin 4.6 3.8 - 4.9 g/dL   Bilirubin Total 0.4 0.0 - 1.2 mg/dL   Bilirubin, Direct 0.11 0.00 - 0.40 mg/dL   Alkaline Phosphatase 70 44 - 121 IU/L   AST 13 0 - 40 IU/L   ALT 9 0 - 32 IU/L  Basic Metabolic Panel (BMET)  Result Value Ref Range   Glucose 65 (L) 70 - 99 mg/dL   BUN 11 6 - 24 mg/dL   Creatinine, Ser 0.59 0.57 - 1.00 mg/dL   eGFR 109 >59 mL/min/1.73   BUN/Creatinine Ratio 19 9 - 23   Sodium 140 134 - 144 mmol/L   Potassium 3.9 3.5 - 5.2 mmol/L   Chloride 102 96 - 106 mmol/L   CO2 26 20 - 29 mmol/L   Calcium 9.3 8.7 - 10.2 mg/dL   Labs reviewed with patient.     Assessment & Plan:   Problem List Items Addressed This Visit   None Visit Diagnoses     Well woman exam    -  Primary   Encounter for hepatitis C screening test for low risk patient       Relevant Orders   Hepatitis C antibody   HIV Antibody (routine testing w rflx)   TSH   Encounter for screening for HIV  Relevant Orders   Hepatitis C antibody   HIV Antibody (routine testing w rflx)    TSH   Screening for deficiency anemia       Relevant Orders   Hepatitis C antibody   HIV Antibody (routine testing w rflx)   TSH   Screening for lipid disorders       Relevant Orders   Hepatitis C antibody   HIV Antibody (routine testing w rflx)   TSH   Viral conjunctivitis of both eyes       Relevant Orders   Hepatitis C antibody   HIV Antibody (routine testing w rflx)   TSH   Screen for colon cancer       Relevant Orders   Cologuard        Education: Labs pending.  Continue to apply prescription eye drops as directed.  Apply warm compresses to eye as tolerated. Follow up early next week if no better. Warning signs reviewed including signs of cellulitis. Go to Urgent Care or ED if worse.  Continue to eat healthy and exercise daily.  Return in about 1 year for physical (around 08/08/2022)

## 2021-08-08 NOTE — Progress Notes (Signed)
° °  Subjective:    Patient ID: Peggy Franklin, female    DOB: 1971/01/18, 51 y.o.   MRN: [id number]  HPI  The patient comes in today for a wellness visit. W/o gyn exam , pt has complete hysterectomy    A review of their health history was completed.  A review of medications was also completed.  Any needed refills; no  Eating habits: good, well balanced  Falls/  MVA accidents in past few months: no  Regular exercise: walk, work, watch grand kids  Specialist pt sees on regular basis: no  Preventative health issues were discussed.     Review of Systems     Objective:   Physical Exam        Assessment & Plan:

## 2021-08-09 LAB — TSH: TSH: 1.32 u[IU]/mL (ref 0.450–4.500)

## 2021-08-09 LAB — HEPATITIS C ANTIBODY: Hep C Virus Ab: NONREACTIVE

## 2021-08-09 LAB — HIV ANTIBODY (ROUTINE TESTING W REFLEX): HIV Screen 4th Generation wRfx: NONREACTIVE

## 2021-08-22 ENCOUNTER — Telehealth: Payer: Self-pay | Admitting: Family Medicine

## 2021-08-22 NOTE — Telephone Encounter (Signed)
Patient dropped off form to be completed in nurses station. ?

## 2021-08-27 ENCOUNTER — Encounter: Payer: Self-pay | Admitting: Podiatry

## 2021-08-27 NOTE — Telephone Encounter (Signed)
Please call and schedule appointment.  Thanks, Dr. Amalia Hailey

## 2021-08-28 NOTE — Telephone Encounter (Signed)
No answer no Vm , sending message through Mychart to schedule.

## 2021-08-28 NOTE — Telephone Encounter (Signed)
Please schedule

## 2021-08-28 NOTE — Telephone Encounter (Signed)
Patient scheduled for 3/13 245p

## 2021-08-28 NOTE — Telephone Encounter (Signed)
thanks

## 2021-08-29 NOTE — Telephone Encounter (Signed)
Form was completed on 08/22/21 ?

## 2021-09-01 ENCOUNTER — Ambulatory Visit: Payer: BC Managed Care – PPO | Admitting: Podiatry

## 2021-09-01 ENCOUNTER — Other Ambulatory Visit: Payer: Self-pay

## 2021-09-01 ENCOUNTER — Ambulatory Visit (INDEPENDENT_AMBULATORY_CARE_PROVIDER_SITE_OTHER): Payer: BC Managed Care – PPO

## 2021-09-01 DIAGNOSIS — M778 Other enthesopathies, not elsewhere classified: Secondary | ICD-10-CM

## 2021-09-01 DIAGNOSIS — R52 Pain, unspecified: Secondary | ICD-10-CM | POA: Diagnosis not present

## 2021-09-01 MED ORDER — MELOXICAM 15 MG PO TABS: 15.0000 mg | ORAL_TABLET | Freq: Every day | ORAL | 1 refills | Status: DC

## 2021-09-01 MED ORDER — BETAMETHASONE SOD PHOS & ACET 6 (3-3) MG/ML IJ SUSP
3.0000 mg | Freq: Once | INTRAMUSCULAR | Status: AC
  Administered 2021-09-01: 3 mg via INTRA_ARTICULAR

## 2021-09-01 NOTE — Progress Notes (Signed)
? ?  HPI: 51 y.o. female presenting today for new complaint of pain that the patient has been experiencing over the past week.  She says that she noticed lump developed to the lateral aspect of the foot which increased in size and pain.  Is very tender to palpation.  She denies a history of injury.  She does have a history of fifth metatarsal tubercle exostectomy with excision of soft tissue mass 01/10/2021.  This area is not symptomatic or painful and is healed nicely. ? ?Past Medical History:  ?Diagnosis Date  ? History of asplenia 07/10/2019  ? ? ?Past Surgical History:  ?Procedure Laterality Date  ? ABDOMINAL HYSTERECTOMY    ? COLON SURGERY    ? LAPAROSCOPIC BILATERAL SALPINGO OOPHERECTOMY Bilateral   ? ? ?Allergies  ?Allergen Reactions  ? Other   ?  Steroids   ? ?  ?Physical Exam: ?General: The patient is alert and oriented x3 in no acute distress. ? ?Dermatology: Skin is warm, dry and supple bilateral lower extremities. Negative for open lesions or macerations. ? ?Vascular: Palpable pedal pulses bilaterally. Capillary refill within normal limits.  Negative for any significant edema or erythema ? ?Neurological: Light touch and protective threshold grossly intact ? ?Musculoskeletal Exam: No pedal deformities noted.  Pain and tenderness to palpation noted along the lateral aspect of the midfoot around the 3rd and 4th TMT joint. ? ?Radiographic Exam:  ?Normal osseous mineralization. Joint spaces preserved. No fracture/dislocation/boney destruction.   ? ?Assessment: ?1.  Capsulitis right lateral foot ? ? ?Plan of Care:  ?1. Patient evaluated. X-Rays reviewed.  ?2.  Injection of 0.5 cc Celestone Soluspan injection to the lateral aspect of the right foot TMT joint ?3.  Prescription for meloxicam 15 mg daily ?4.  Return to clinic 4 weeks ? ?*Works on her feet 8-12 hours/day ? ?  ?  ?Edrick Kins, DPM ?[facility] ? ?Dr. Edrick Kins, DPM  ?  ?[address]                                         ?[place], [place] [address]                ?Office [phone]  ?Fax [phone] ? ? ? ? ?

## 2021-09-02 ENCOUNTER — Telehealth: Payer: Self-pay | Admitting: Nurse Practitioner

## 2021-09-02 NOTE — Telephone Encounter (Signed)
Patient brought in FMLA to be completed today in your box ?

## 2021-09-23 ENCOUNTER — Other Ambulatory Visit: Payer: Self-pay

## 2021-09-23 MED ORDER — PANTOPRAZOLE SODIUM 40 MG PO TBEC: DELAYED_RELEASE_TABLET | ORAL | 1 refills | Status: DC

## 2021-10-06 ENCOUNTER — Ambulatory Visit: Payer: BC Managed Care – PPO | Admitting: Podiatry

## 2021-10-21 ENCOUNTER — Telehealth: Payer: Self-pay

## 2021-10-22 ENCOUNTER — Ambulatory Visit: Payer: BC Managed Care – PPO | Admitting: Family Medicine

## 2021-10-22 ENCOUNTER — Telehealth: Payer: Self-pay | Admitting: Orthopedic Surgery

## 2021-10-22 NOTE — Telephone Encounter (Signed)
Patient called to inquire about cyst on her elbow which she relays she has had in past and had removed by general surgeon Dr Romona Curls, who is retired. States she has called orthopaedic offices, including ours, and said was advised to call general surgeon. States general surgeon advised to call orthopaedics. Patient said she also called primary care, who does not take care of. States she thinks it was not a ganglion, it was a 'polyander cyst'.  Schedule here or other advice? ?

## 2021-10-23 NOTE — Telephone Encounter (Signed)
Error

## 2021-10-24 ENCOUNTER — Ambulatory Visit
Admission: EM | Admit: 2021-10-24 | Discharge: 2021-10-24 | Disposition: A | Discharge status: Home / Self Care | Payer: BC Managed Care – PPO

## 2021-10-24 DIAGNOSIS — M71322 Other bursal cyst, left elbow: Secondary | ICD-10-CM | POA: Diagnosis not present

## 2021-10-24 NOTE — Telephone Encounter (Signed)
Called back to patient; no voice mail; unable to leave message. ?

## 2021-10-24 NOTE — ED Provider Notes (Signed)
?[place] ? ? ? ?CSN: [id number] ?Arrival date & time: 10/24/21  0800 ? ? ?  ? ?History   ?Chief Complaint ?Chief Complaint  ?Patient presents with  ? Abscess  ? ? ?HPI ?Peggy Franklin is a 51 y.o. female presenting with request that I drain the cyst on her left elbow.  This has been surgically removed in the past, but her orthopedic surgeon has retired.  She has been in communication with a different orthopedic surgeon, she states that they have been giving her the run around and nobody wants to deal with it, though in chart review it appears that they want to schedule a visit to evaluate her condition.  She states that she requires this to be drained today. ? ?HPI ? ?Past Medical History:  ?Diagnosis Date  ? History of asplenia 07/10/2019  ? ? ?Patient Active Problem List  ? Diagnosis Date Noted  ? Sore throat 09/24/2020  ? Non-recurrent acute suppurative otitis media of right ear without spontaneous rupture of tympanic membrane 09/24/2020  ? Seasonal allergic rhinitis due to pollen 09/24/2020  ? COVID-19 virus infection 07/12/2020  ? Close exposure to 2019 novel coronavirus 07/10/2020  ? Diarrhea 05/30/2020  ? Cough 02/05/2020  ? Head congestion 02/05/2020  ? History of asplenia 07/10/2019  ? Genital warts 04/08/2018  ? Anxiety 04/08/2018  ? Anal skin tag 03/28/2015  ? Gastroesophageal reflux disease without esophagitis 03/07/2015  ? Postsurgical dumping syndrome 01/20/2013  ? ? ?Past Surgical History:  ?Procedure Laterality Date  ? ABDOMINAL HYSTERECTOMY    ? COLON SURGERY    ? LAPAROSCOPIC BILATERAL SALPINGO OOPHERECTOMY Bilateral   ? ? ?OB History   ? ? Gravida  ?3  ? Para  ?3  ? Term  ?2  ? Preterm  ?1  ? AB  ?   ? Living  ?2  ?  ? ? SAB  ?   ? IAB  ?   ? Ectopic  ?   ? Multiple  ?   ? Live Births  ?   ?   ?  ?  ? ? ? ?Home Medications   ? ?Prior to Admission medications   ?Medication Sig Start Date End Date Taking? Authorizing Provider  ?albuterol (VENTOLIN HFA) 108 (90 Base) MCG/ACT inhaler INHALE  2 PUFFS INTO THE LUNGS EVERY 6 HOURS AS NEEDED FOR WHEEZING OR SHORTNESS OF BREATH 08/27/20   Kathyrn Drown, MD  ?cetirizine (ZYRTEC) 10 MG tablet TAKE 1 TABLET(10 MG) BY MOUTH DAILY 09/24/20   Chalmers Guest, NP  ?meloxicam (MOBIC) 15 MG tablet Take 1 tablet (15 mg total) by mouth daily. 09/01/21   Edrick Kins, DPM  ?montelukast (SINGULAIR) 10 MG tablet Take 1 tablet (10 mg total) by mouth at bedtime. 08/06/21   [employer], Trenton Gammon, NP  ?moxifloxacin (VIGAMOX) 0.5 % ophthalmic solution Place 1 drop into the left eye 3 (three) times daily. 08/04/21   [provider]  ?ondansetron (ZOFRAN-ODT) 4 MG disintegrating tablet Take 1 tablet (4 mg total) by mouth every 8 (eight) hours as needed for nausea or vomiting. 06/25/21   Volney American, PA-C  ?pantoprazole (PROTONIX) 40 MG tablet TAKE 1 TABLET(40 MG) BY MOUTH DAILY 09/23/21   Kathyrn Drown, MD  ? ? ?Family History ?Family History  ?Problem Relation Age of Onset  ? Cancer Mother 3  ?     pancreatic cancer  ? Cancer Maternal Grandmother   ?     renal and uterine  cancer  ? Heart attack Maternal Grandmother   ? Emphysema Paternal Grandfather   ? Stroke Paternal Grandmother   ? Other Maternal Grandfather   ?     accident  ? Heart attack Daughter   ? ? ?Social History ?Social History  ? ?Tobacco Use  ? Smoking status: Every Day  ?  Packs/day: 0.50  ?  Types: Cigarettes  ?  Start date: 04/06/2013  ? Smokeless tobacco: Never  ? Tobacco comments:  ?  Smokes E cig  ?Vaping Use  ? Vaping Use: Never used  ?Substance Use Topics  ? Alcohol use: No  ? Drug use: No  ? ? ? ?Allergies   ?Other ? ? ?Review of Systems ?Review of Systems  ?Skin:  Positive for color change.  ?All other systems reviewed and are negative. ? ? ?Physical Exam ?Triage Vital Signs ?ED Triage Vitals  ?Enc Vitals Group  ?   BP 10/24/21 0818 119/78  ?   Pulse Rate 10/24/21 0818 71  ?   Resp 10/24/21 0818 20  ?   Temp 10/24/21 0818 97.9 ?F (36.6 ?C)  ?   Temp src --   ?   SpO2 10/24/21 0818 95 %  ?    Weight --   ?   Height --   ?   Head Circumference --   ?   Peak Flow --   ?   Pain Score 10/24/21 0820 5  ?   Pain Loc --   ?   Pain Edu? --   ?   Excl. in [place]? --   ? ?No data found. ? ?Updated Vital Signs ?BP 119/78   Pulse 71   Temp 97.9 ?F (36.6 ?C)   Resp 20   SpO2 95%  ? ?Visual Acuity ?Right Eye Distance:   ?Left Eye Distance:   ?Bilateral Distance:   ? ?Right Eye Near:   ?Left Eye Near:    ?Bilateral Near:    ? ?Physical Exam ?Vitals reviewed.  ?Constitutional:   ?   General: She is not in acute distress. ?   Appearance: Normal appearance. She is not ill-appearing.  ?HENT:  ?   Head: Normocephalic and atraumatic.  ?Pulmonary:  ?   Effort: Pulmonary effort is normal.  ?Skin: ?   Comments: Left elbow- tiny scab overlying olecranon. There is NO palpable cyst, mass, erythema, warmth, induration, or fluctuance. ROM elbow intact.  ?Neurological:  ?   General: No focal deficit present.  ?   Mental Status: She is alert and oriented to person, place, and time.  ?Psychiatric:     ?   Mood and Affect: Mood normal.     ?   Behavior: Behavior normal.     ?   Thought Content: Thought content normal.     ?   Judgment: Judgment normal.  ? ? ? ?UC Treatments / Results  ?Labs ?(all labs ordered are listed, but only abnormal results are displayed) ?Labs Reviewed - No data to display ? ?EKG ? ? ?Radiology ?No results found. ? ?Procedures ?Procedures (including critical care time) ? ?Medications Ordered in UC ?Medications - No data to display ? ?Initial Impression / Assessment and Plan / UC Course  ?I have reviewed the triage vital signs and the nursing notes. ? ?Pertinent labs & imaging results that were available during my care of the patient were reviewed by me and considered in my medical decision making (see chart for details). ? ?  ?This patient is a  51 y.o. year old female presenting with cyst L elbow - requesting I surgically drain this today. On exam, there is nothing to drain. She declines antibiotics or a work  note multiple times, stating she needs relief today. She is concerned that her orthopedic surgeon has been giving her the run around and does not want to deal with this; however, chart review indicates that her orthopedic surgeon in fact wishes to schedule an appointment to evaluate her condition. I personally showed patient this in her chart. Nonetheless, patient was upset that I could not surgically explore her elbow, and left without being discharged.  ? ?Final Clinical Impressions(s) / UC Diagnoses  ? ?Final diagnoses:  ?Other bursal cyst, left elbow  ? ?Discharge Instructions   ?None ?  ? ?ED Prescriptions   ?None ?  ? ?PDMP not reviewed this encounter. ?  ?Hazel Sams, PA-C ?10/24/21 4643 ? ?

## 2021-10-24 NOTE — ED Triage Notes (Signed)
Pt presents with small abscess on left elbow for past 3 weeks ?

## 2021-10-24 NOTE — Telephone Encounter (Signed)
Patient states she does not have records rom Dr Romona Curls but has had at least 59 of this type of cyst removed from different parts of her body;said the elbow is a new area in which she has noted one.  Patient relays she went to [facility] urgent care in [place] for this issue however did not feel that the issue was fully addressed. States the provider did not feel anything, but said that when provider touched the elbow, it was very sore and tender. States the provider offered her an antibiotic, to which she asked, if there is nothing that you see see or feel there, why should I take an antibiotic?   ? Patient relayed that she called her primary care, Dr Lance Sell office; states was advised they have a provider out and have no immediate availability. ? Please advise of any recommendation. ?

## 2021-10-27 ENCOUNTER — Telehealth: Payer: Self-pay

## 2021-10-27 NOTE — Telephone Encounter (Signed)
Encourage patient to contact the pharmacy for refills or they can request refills through [facility] ? ?(Please schedule appointment if patient has not been seen in over a year) ? ? ? ?WHAT PHARMACY WOULD THEY LIKE THIS SENT TO: pantoprazole (PROTONIX) 40 MG tablet  ? ?MEDICATION NAME & DOSE:[id number] - [place], [place] - [address] AT [address]  ? ?NOTES/COMMENTS FROM PATIENT: ? ? ? ? ? ?[place] office please notify patient: ?It takes 48-72 hours to process rx refill requests ?Ask patient to call pharmacy to ensure rx is ready before heading there.  ? ?

## 2021-10-27 NOTE — Telephone Encounter (Signed)
90 day with one refill sent to [facility] on 09/23/21. Attempted to contact pt; no voicemail set up at this time. My chart message sent to patient.  ?

## 2021-10-31 NOTE — Telephone Encounter (Signed)
Have been unable to reach patient.  ?

## 2021-11-20 ENCOUNTER — Other Ambulatory Visit: Payer: Self-pay | Admitting: Podiatry

## 2021-11-20 DIAGNOSIS — M7989 Other specified soft tissue disorders: Secondary | ICD-10-CM

## 2021-12-24 ENCOUNTER — Ambulatory Visit: Payer: BC Managed Care – PPO | Admitting: Nurse Practitioner

## 2021-12-29 ENCOUNTER — Emergency Department (HOSPITAL_COMMUNITY): Payer: BC Managed Care – PPO

## 2021-12-29 ENCOUNTER — Ambulatory Visit: Payer: BC Managed Care – PPO | Admitting: Family Medicine

## 2021-12-29 ENCOUNTER — Other Ambulatory Visit: Payer: Self-pay

## 2021-12-29 ENCOUNTER — Encounter (HOSPITAL_COMMUNITY): Payer: Self-pay

## 2021-12-29 ENCOUNTER — Emergency Department (HOSPITAL_COMMUNITY)
Admission: EM | Admit: 2021-12-29 | Discharge: 2021-12-29 | Disposition: A | Discharge status: Home / Self Care | Payer: BC Managed Care – PPO | Attending: Student | Admitting: Student

## 2021-12-29 ENCOUNTER — Encounter: Payer: Self-pay | Admitting: Family Medicine

## 2021-12-29 VITALS — BP 110/72 | HR 75 | Temp 97.9°F | Wt 126.0 lb

## 2021-12-29 DIAGNOSIS — N2889 Other specified disorders of kidney and ureter: Secondary | ICD-10-CM | POA: Diagnosis not present

## 2021-12-29 DIAGNOSIS — R1084 Generalized abdominal pain: Secondary | ICD-10-CM | POA: Diagnosis not present

## 2021-12-29 DIAGNOSIS — K5730 Diverticulosis of large intestine without perforation or abscess without bleeding: Secondary | ICD-10-CM | POA: Diagnosis not present

## 2021-12-29 DIAGNOSIS — K5792 Diverticulitis of intestine, part unspecified, without perforation or abscess without bleeding: Secondary | ICD-10-CM

## 2021-12-29 DIAGNOSIS — K769 Liver disease, unspecified: Secondary | ICD-10-CM | POA: Diagnosis not present

## 2021-12-29 DIAGNOSIS — R109 Unspecified abdominal pain: Secondary | ICD-10-CM | POA: Diagnosis not present

## 2021-12-29 LAB — URINALYSIS, ROUTINE W REFLEX MICROSCOPIC
Bilirubin Urine: NEGATIVE
Glucose, UA: NEGATIVE mg/dL
Ketones, ur: NEGATIVE mg/dL
Nitrite: NEGATIVE
Protein, ur: NEGATIVE mg/dL
Specific Gravity, Urine: 1.004 — ABNORMAL LOW (ref 1.005–1.030)
pH: 7 (ref 5.0–8.0)

## 2021-12-29 LAB — COMPREHENSIVE METABOLIC PANEL
ALT: 13 U/L (ref 0–44)
AST: 17 U/L (ref 15–41)
Albumin: 4.2 g/dL (ref 3.5–5.0)
Alkaline Phosphatase: 60 U/L (ref 38–126)
Anion gap: 8 (ref 5–15)
BUN: 10 mg/dL (ref 6–20)
CO2: 28 mmol/L (ref 22–32)
Calcium: 9 mg/dL (ref 8.9–10.3)
Chloride: 102 mmol/L (ref 98–111)
Creatinine, Ser: 0.51 mg/dL (ref 0.44–1.00)
GFR, Estimated: >60 mL/min (ref >60)
Glucose, Bld: 93 mg/dL (ref 70–99)
Potassium: 3.7 mmol/L (ref 3.5–5.1)
Sodium: 138 mmol/L (ref 135–145)
Total Bilirubin: 0.4 mg/dL (ref 0.3–1.2)
Total Protein: 7.3 g/dL (ref 6.5–8.1)

## 2021-12-29 LAB — CBC
HCT: 41.2 % (ref 36.0–46.0)
Hemoglobin: 13.8 g/dL (ref 12.0–15.0)
MCH: 30.9 pg (ref 26.0–34.0)
MCHC: 33.5 g/dL (ref 30.0–36.0)
MCV: 92.4 fL (ref 80.0–100.0)
Platelets: 188 10*3/uL (ref 150–400)
RBC: 4.46 MIL/uL (ref 3.87–5.11)
RDW: 12.6 % (ref 11.5–15.5)
WBC: 9.2 10*3/uL (ref 4.0–10.5)
nRBC: 0 % (ref 0.0–0.2)

## 2021-12-29 LAB — POC URINE PREG, ED: Preg Test, Ur: NEGATIVE

## 2021-12-29 LAB — LIPASE, BLOOD: Lipase: 28 U/L (ref 11–51)

## 2021-12-29 MED ORDER — CIPROFLOXACIN HCL 500 MG PO TABS: 500.0000 mg | ORAL_TABLET | Freq: Two times a day (BID) | ORAL | 0 refills | Status: DC

## 2021-12-29 MED ORDER — FENTANYL CITRATE PF 50 MCG/ML IJ SOSY
50.0000 ug | PREFILLED_SYRINGE | Freq: Once | INTRAMUSCULAR | Status: AC
  Administered 2021-12-29: 50 ug via INTRAVENOUS
  Filled 2021-12-29: qty 1

## 2021-12-29 MED ORDER — IOHEXOL 300 MG/ML  SOLN
100.0000 mL | Freq: Once | INTRAMUSCULAR | Status: AC | PRN
  Administered 2021-12-29: 100 mL via INTRAVENOUS

## 2021-12-29 MED ORDER — METRONIDAZOLE 500 MG PO TABS
500.0000 mg | ORAL_TABLET | Freq: Once | ORAL | Status: AC
  Administered 2021-12-29: 500 mg via ORAL
  Filled 2021-12-29: qty 1

## 2021-12-29 MED ORDER — METRONIDAZOLE 500 MG PO TABS: 500.0000 mg | ORAL_TABLET | Freq: Two times a day (BID) | ORAL | 0 refills | Status: DC

## 2021-12-29 MED ORDER — ONDANSETRON HCL 4 MG/2ML IJ SOLN
4.0000 mg | Freq: Once | INTRAMUSCULAR | Status: AC
  Administered 2021-12-29: 4 mg via INTRAVENOUS
  Filled 2021-12-29: qty 2

## 2021-12-29 MED ORDER — CIPROFLOXACIN HCL 250 MG PO TABS
500.0000 mg | ORAL_TABLET | Freq: Once | ORAL | Status: AC
Start: 2021-12-29 — End: 2021-12-29
  Administered 2021-12-29: 500 mg via ORAL
  Filled 2021-12-29: qty 2

## 2021-12-29 NOTE — Progress Notes (Signed)
   Subjective:    Patient ID: Peggy Franklin, female    DOB: 04-Oct-1970, 51 y.o.   MRN: [id number]  HPI  Patient having trouble with having a BM. She says this started yesterday.  She went a little this morning, BM was kinda soft but not normal. Having pressure in abdominal area. She drank some pickle juice to help ease up the acid reflux.  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  Electronic records do not have the details of this  She relates over the past few days she has noted some increased bloating sensation and abdominal discomfort along with minimal bowel movements Today increased pain and discomfort she states she was only able to sleep 1 to 2 hours at a time through the night and from 3 AM onwards she has had significant pain and discomfort in her abdomen having to rock back and forth and relates severe nausea that comes in waves. Review of Systems     Objective:   Physical Exam  Lungs are clear respiratory rate normal heart regular abdomen is soft with diminished bowel sounds Significant tenderness epigastric and mid abdomen left side of abdomen and left lower quadrant no rebound      Assessment & Plan:  Difficulty having bowel movement Severe nausea Abdominal pain and discomfort History of previous surgery Cannot rule out the possibility of adhesions versus colitis causing her issues. Because of the severity of her issues I do not feel that she can wait till tomorrow as an outpatient to do these test Therefore I spoke to the ER they will see her and evaluate her further along with doing lab work and CAT scan we will follow along electronically

## 2021-12-29 NOTE — ED Triage Notes (Signed)
Pt to ED from home, sent here by Dr Wolfgang Phoenix- this nurse spoke with Dr Wolfgang Phoenix, reports pt has been having abd pain in all quads, worse on left, pain started few days ago, but got worse last night, pt has hx of partial removal of small intestine from tumor back in 2009 and is concerned for bowel blockage. Pt reports last BM was this morning, small, hard stool, reports constipation.

## 2021-12-29 NOTE — ED Provider Notes (Incomplete)
Pain, nausea, sent in to rule out obstruction.  CT, f/u pcp if negative, pain med, zofran.

## 2021-12-29 NOTE — Discharge Instructions (Signed)
The CT scan of your abdomen and pelvis shows that you have diverticulitis.  This is treated with antibiotics.  It is important that you take the antibiotics as directed until they are finished.  Bland food and liquids as tolerated.  Please follow-up with your primary care provider for recheck, I have also listed information for a local GI provider for you to follow-up with as well if needed.  Return emergency department if you develop any new or worsening symptoms.

## 2021-12-29 NOTE — ED Provider Notes (Signed)
[facility] Provider Note   CSN: [id number] Arrival date & time: 12/29/21  1648     History {Add pertinent medical, surgical, social history, OB history to HPI:1} Chief Complaint  Patient presents with   Abdominal Pain    Peggy Franklin is a 51 y.o. female.   Abdominal Pain Associated symptoms: nausea   Associated symptoms: no chest pain, no chills, no cough, no diarrhea, no dysuria, no fever, no shortness of breath and no vomiting         Peggy Franklin is a 51 y.o. 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  Pain has been dull and associated with bloating and nausea.  Small BM this morning that was nonbloody or black.  She was seen by PCP earlier today and sent here for further evaluation of possible bowel obstruction.  She denies flank pain, fever, chills, vomiting or dysuria.    Home Medications Prior to Admission medications   Medication Sig Start Date End Date Taking? Authorizing Provider  cetirizine (ZYRTEC) 10 MG tablet TAKE 1 TABLET(10 MG) BY MOUTH DAILY Patient not taking: Reported on 12/29/2021 09/24/20   Chalmers Guest, NP  pantoprazole (PROTONIX) 40 MG tablet TAKE 1 TABLET(40 MG) BY MOUTH DAILY 09/23/21   Kathyrn Drown, MD      Allergies    Other    Review of Systems   Review of Systems  Constitutional:  Positive for appetite change. Negative for chills and fever.  Respiratory:  Negative for cough and shortness of breath.   Cardiovascular:  Negative for chest pain.  Gastrointestinal:  Positive for abdominal pain and nausea. Negative for diarrhea and vomiting.  Genitourinary:  Negative for difficulty urinating, dysuria and flank pain.  Musculoskeletal:  Negative for back pain.  Skin:  Negative for rash.  Neurological:  Negative for weakness.     Physical Exam Updated Vital Signs BP 115/72 (BP Location: Left Arm)   Pulse 83   Temp 98 F (36.7 C) (Oral)   Resp 18   Ht '5\' 1"'$  (1.549 m)   Wt 57.2 kg   SpO2 95%   BMI 23.81 kg/m  Physical Exam Vitals and nursing note reviewed.  Constitutional:      Appearance: She is well-developed. She is not ill-appearing or toxic-appearing.  HENT:     Mouth/Throat:     Mouth: Mucous membranes are moist.  Cardiovascular:     Rate and Rhythm: Normal rate and regular rhythm.     Pulses: Normal pulses.  Pulmonary:     Effort: Pulmonary effort is normal.     Breath sounds: Normal breath sounds.  Abdominal:     General: There is no distension.     Palpations: Abdomen is soft. There is no mass.     Tenderness: There is abdominal tenderness. There is no right CVA tenderness, left CVA tenderness or guarding.     Comments: Diffuse ttp of the entire abdomen, tenderness slightly greater at left mid abdomen.  No guarding or rebound tenderness.   Musculoskeletal:     Right lower leg: No edema.     Left lower leg: No edema.  Skin:    General: Skin is warm.     Capillary Refill: Capillary refill takes less than 2 seconds.  Neurological:     General: No focal deficit present.  Mental Status: She is alert.     Sensory: No sensory deficit.     Motor: No weakness.     ED Results / Procedures / Treatments   Labs (all labs ordered are listed, but only abnormal results are displayed) Labs Reviewed  URINALYSIS, ROUTINE W REFLEX MICROSCOPIC - Abnormal; Notable for the following components:      Result Value   Color, Urine STRAW (*)    Specific Gravity, Urine 1.004 (*)    Hgb urine dipstick SMALL (*)    Leukocytes,Ua SMALL (*)    Bacteria, UA RARE (*)    All other components within normal limits  LIPASE, BLOOD  COMPREHENSIVE METABOLIC PANEL  CBC  POC URINE PREG, ED    EKG None  Radiology No results found.  Procedures Procedures  {Document cardiac monitor, telemetry assessment  procedure when appropriate:1}  Medications Ordered in ED Medications  ondansetron (ZOFRAN) injection 4 mg (has no administration in time range)  fentaNYL (SUBLIMAZE) injection 50 mcg (has no administration in time range)    ED Course/ Medical Decision Making/ A&P                           Medical Decision Making Patient here with diffuse abdominal pain for few days.  Pains been associated with nausea, no vomiting fever or chills.  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  Seen by PCP today and sent here for evaluation of possible bowel obstruction.  On exam, patient nontoxic-appearing.  Vital signs are reassuring.  No active vomiting.  Abdomen is soft on my exam, some mild tenderness throughout, left mid abdomen tenderness greater than right.  Bowel sounds heard in all 4 quadrants.  Amount and/or Complexity of Data Reviewed Labs: ordered.    Details: Labs interpreted by me, no evidence of leukocytosis, chemistries unremarkable.  Lipase reassuring.  Urinalysis without evidence of infection.  Pregnancy test negative. Radiology: ordered.  Risk Prescription drug management.     {Document critical care time when appropriate:1} {Document review of labs and clinical decision tools ie heart score, Chads2Vasc2 etc:1}  {Document your independent review of radiology images, and any outside records:1} {Document your discussion with family members, caretakers, and with consultants:1} {Document social determinants of health affecting pt's care:1} {Document your decision making why or why not admission, treatments were needed:1} Final Clinical Impression(s) / ED Diagnoses Final diagnoses:  None    Rx / DC Orders ED Discharge Orders     None

## 2021-12-31 ENCOUNTER — Telehealth: Payer: Self-pay | Admitting: *Deleted

## 2021-12-31 NOTE — Telephone Encounter (Signed)
Pt informed and appt made.

## 2021-12-31 NOTE — Telephone Encounter (Signed)
Patient called and states her work note says she can go back on 12/02/21 and she says that she had told her work that she would return on the 18th. Can her work note be changed to go back on the 18 th.  She said that she is still feeling bloated and having pressure. Please advise. Thank you.

## 2021-12-31 NOTE — Telephone Encounter (Signed)
May give work ecxcuse  I can recheck her Thursday at 11:30

## 2022-01-01 ENCOUNTER — Ambulatory Visit (INDEPENDENT_AMBULATORY_CARE_PROVIDER_SITE_OTHER): Payer: Self-pay | Admitting: Family Medicine

## 2022-01-01 ENCOUNTER — Telehealth: Payer: Self-pay | Admitting: Family Medicine

## 2022-01-01 ENCOUNTER — Encounter: Payer: Self-pay | Admitting: Family Medicine

## 2022-01-01 VITALS — BP 106/69 | HR 77 | Temp 98.0°F | Ht 61.0 in | Wt 129.0 lb

## 2022-01-01 DIAGNOSIS — Z1211 Encounter for screening for malignant neoplasm of colon: Secondary | ICD-10-CM

## 2022-01-01 DIAGNOSIS — I700 Atherosclerosis of aorta: Secondary | ICD-10-CM

## 2022-01-01 DIAGNOSIS — K5792 Diverticulitis of intestine, part unspecified, without perforation or abscess without bleeding: Secondary | ICD-10-CM

## 2022-01-01 DIAGNOSIS — F17200 Nicotine dependence, unspecified, uncomplicated: Secondary | ICD-10-CM

## 2022-01-01 MED ORDER — ROSUVASTATIN CALCIUM 5 MG PO TABS: 5.0000 mg | ORAL_TABLET | Freq: Every day | ORAL | 1 refills | Status: DC

## 2022-01-01 NOTE — Progress Notes (Signed)
   Subjective:    Patient ID: Peggy Franklin, female    DOB: [birth year], 51 y.o.   MRN: [id number]  HPI  Patient still feeling bloated especially after eating and full feeling.  Relates she is trying to the best she can with healthy eating She has lots of questions regarding what is diverticulitis and how should she treat it she is currently taking the antibiotics Tolerating okay No mucousy or bloody stools She states abdominal discomfort is doing better Energy level is low Does not feel she is capable of going back to work currently Patient says she was told that she had diverticulitis.Marland Kitchen She wasn't explained anything about this condition.  Review of Systems     Objective:   Physical Exam General-in no acute distress Eyes-no discharge Lungs-respiratory rate normal, CTA CV-no murmurs,RRR Extremities skin warm dry no edema Neuro grossly normal Behavior normal, alert  Abdomen soft with tenderness in the left upper quadrant and left mid quadrant CT scan reviewed in detail     Assessment & Plan:   1. Aortic atherosclerosis ([place]) This was seen on CAT scan she needs to start a statin we will do a low-dose statin with follow-up lipid liver in approximately 2 months with a follow-up office visit in 3 months  2. Smoker Patient was counseled strongly to quit smoking and lower her risk of heart disease and cancer  3. Encounter for screening colonoscopy She is due for colonoscopy but diverticulitis needs to settle down so hopefully later this fall  4. Diverticulitis She is on double antibiotics that should help if she has any side effects antibiotics let us know dietary measures discussed avoiding constipation discussed warning signs discussed  She should be able to return to work this coming Sunday, July 52

## 2022-01-01 NOTE — Telephone Encounter (Signed)
Patient had Disability papers faxed over to be completed in your yellow folder.

## 2022-01-02 ENCOUNTER — Other Ambulatory Visit: Payer: Self-pay | Admitting: Family Medicine

## 2022-01-02 DIAGNOSIS — Z79899 Other long term (current) drug therapy: Secondary | ICD-10-CM

## 2022-01-02 DIAGNOSIS — Z13220 Encounter for screening for lipoid disorders: Secondary | ICD-10-CM

## 2022-01-02 NOTE — Progress Notes (Signed)
01/02/22-lab orders placed and mailed to patient to have labs done in 8-10 weeks. Pt has appt set for October

## 2022-01-05 NOTE — Telephone Encounter (Signed)
These were filled out and forwarded  There is administrative portion to be filled out otherwise it is ready

## 2022-01-13 ENCOUNTER — Encounter: Payer: Self-pay | Admitting: Family Medicine

## 2022-01-14 MED ORDER — FLUCONAZOLE 150 MG PO TABS: ORAL_TABLET | ORAL | 1 refills | Status: DC

## 2022-01-14 NOTE — Telephone Encounter (Signed)
Nurses-I recommend Diflucan 150 mg 1 p.o. x1 with 1 refill May repeat in 1 week if necessary If ongoing troubles recommend follow-up visit with Docia Barrier or Hoyle Sauer for gynecologic evaluation Thanks-Dr. Nicki Reaper

## 2022-01-21 ENCOUNTER — Ambulatory Visit: Payer: BC Managed Care – PPO | Admitting: Adult Health

## 2022-01-21 ENCOUNTER — Encounter: Payer: Self-pay | Admitting: Adult Health

## 2022-01-21 VITALS — BP 100/66 | HR 68 | Ht 61.0 in | Wt 125.5 lb

## 2022-01-21 DIAGNOSIS — R238 Other skin changes: Secondary | ICD-10-CM

## 2022-01-21 DIAGNOSIS — L439 Lichen planus, unspecified: Secondary | ICD-10-CM

## 2022-01-21 MED ORDER — CLOBETASOL PROPIONATE 0.05 % EX OINT
1.0000 | TOPICAL_OINTMENT | Freq: Two times a day (BID) | CUTANEOUS | 1 refills | Status: DC
Start: 2022-01-21 — End: 2022-05-08

## 2022-01-21 MED ORDER — VALACYCLOVIR HCL 1 G PO TABS: 1000.0000 mg | ORAL_TABLET | Freq: Two times a day (BID) | ORAL | 1 refills | Status: DC

## 2022-01-21 NOTE — Progress Notes (Signed)
  Subjective:     Patient ID: Peggy Franklin, female   DOB: 12-14-1970, 51 y.o.   MRN: [id number]  HPI Peggy Franklin is a 51 year old white female, with SO, sp hysterectomy, in complaining of blister, ?cyst on vulva for 1 weeks, hurts to sit, walk, or have clothes touch it, she has been on antibiotics for diverticulitis. PCP is Dr Sallee Lange.   Review of Systems Pain in vulva area Reviewed past medical,surgical, social and family history. Reviewed medications and allergies.     Objective:   Physical Exam BP 100/66 (BP Location: Left Arm, Patient Position: Sitting, Cuff Size: Normal)   Pulse 68   Ht '5\' 1"'$  (1.549 m)   Wt 125 lb 8 oz (56.9 kg)   BMI 23.71 kg/m     Skin warm and dry.Pelvic: external genitalia has 2 cm, raised area at base left labia, pink, but has thick white area left edge and it is tender, looks a little like lichen planus, but I feel it needs to be biopsied, and has vesicle, right inner labia, HSV culture obtained,vagina is pain, urethra has no masses or lesions, cervix and uterus are absent,adnexa: no masses or tenderness noted. Bladder is non tender and no masses felt.   [place] - 01/21/22 1516       Pregnancy Intention Screening   Does the patient want to become pregnant in the next year? No    Does the patient's partner want to become pregnant in the next year? No    Would the patient like to discuss contraceptive options today? No      Contraception Wrap Up   Current Method Female Sterilization   hyst   End Method Female Sterilization   hyst   Contraception Counseling Provided No            Examination chaperoned by Levy Pupa LPN  Assessment:      1. Vesicle of skin Has vesicle right inner labia Will rx valtrex 1 gm bid x 10 days HSV culture sent   2. Lichen planus Will rx temovate 0.05% ointment  to use bid Can use peri bottle to squirt warm water when peeing Can use frozen peas prn Meds ordered this encounter  Medications   valACYclovir (VALTREX)  1000 MG tablet    Sig: Take 1 tablet (1,000 mg total) by mouth 2 (two) times daily.    Dispense:  20 tablet    Refill:  1    Order Specific Question:   Supervising Provider    Answer:   Tania Ade H [[id number]]   clobetasol ointment (TEMOVATE) 0.05 %    Sig: Apply 1 Application topically 2 (two) times daily.    Dispense:  30 g    Refill:  1    Order Specific Question:   Supervising Provider    Answer:   Florian Buff [[id number]]   Wear lose clothing Will get in to see Dr Nelda Marseille for biopsy 01/27/22     Plan:    Will give note if misses work Return 01/27/22 at 1:50 pm with Dr Nelda Marseille for biopsy left vulva

## 2022-01-23 LAB — HERPES SIMPLEX VIRUS CULTURE

## 2022-01-27 ENCOUNTER — Encounter: Payer: Self-pay | Admitting: Obstetrics & Gynecology

## 2022-01-27 ENCOUNTER — Other Ambulatory Visit (HOSPITAL_COMMUNITY)
Admission: RE | Admit: 2022-01-27 | Discharge: 2022-01-27 | Disposition: A | Discharge status: Home / Self Care | Payer: BC Managed Care – PPO | Source: Ambulatory Visit | Attending: Obstetrics & Gynecology | Admitting: Obstetrics & Gynecology

## 2022-01-27 ENCOUNTER — Ambulatory Visit: Payer: BC Managed Care – PPO | Admitting: Obstetrics & Gynecology

## 2022-01-27 VITALS — BP 106/72 | HR 73 | Ht 61.0 in | Wt 125.6 lb

## 2022-01-27 DIAGNOSIS — R87613 High grade squamous intraepithelial lesion on cytologic smear of cervix (HGSIL): Secondary | ICD-10-CM | POA: Diagnosis not present

## 2022-01-27 DIAGNOSIS — N9089 Other specified noninflammatory disorders of vulva and perineum: Secondary | ICD-10-CM

## 2022-01-27 DIAGNOSIS — D071 Carcinoma in situ of vulva: Secondary | ICD-10-CM | POA: Diagnosis not present

## 2022-01-27 NOTE — Progress Notes (Addendum)
GYN VISIT Patient name: Peggy Franklin MRN [id number]  Date of birth: April 18, 1971 Chief Complaint:   vulvar biopsy  History of Present Illness:   Peggy Franklin is a 51 y.o. G3P2102 PM, [place] female being seen today for the following concerns:  Vulvar lesion: Seen by Electa Sniff last week- note reviewed.  Started on Temovate and HSV treatment and advised f/u for vulvar biopsy.  To review, she noted noted about 3 wks ago pain and thought it was due to her shorts rubbing.  She got a Geologist, engineering, saw the white and came in for evaluation.  She continues to have pain and discomfort in that area despite the ointment.  Denies vaginal discharge or itching, mostly just burning.  No other acute complaints  No LMP recorded. Patient has had a hysterectomy.     02/25/2021    3:19 PM 08/05/2020    8:50 AM 07/10/2020    2:51 PM 02/05/2020    8:43 AM 07/01/2018    1:33 PM  Depression screen PHQ 2/9  Decreased Interest 0 0 0 0 1  Down, Depressed, Hopeless 0 0 0 0 1  PHQ - 2 Score 0 0 0 0 2  Altered sleeping     2  Tired, decreased energy     2  Change in appetite     3  Feeling bad or failure about yourself      1  Trouble concentrating     0  Moving slowly or fidgety/restless     0  Suicidal thoughts     0  PHQ-9 Score     10  Difficult doing work/chores     Somewhat difficult     Review of Systems:   Pertinent items are noted in HPI Denies fever/chills, dizziness, headaches, visual disturbances, fatigue, shortness of breath, chest pain, abdominal pain, vomiting, no problems with bowel movements, urination, or intercourse unless otherwise stated above.  Pertinent History Reviewed:  Reviewed past medical,surgical, social, obstetrical and family history.  Reviewed problem list, medications and allergies. Physical Assessment:   Vitals:   01/27/22 1341  BP: 106/72  Pulse: 73  Weight: 125 lb 9.6 oz (57 kg)  Height: '5\' 1"'$  (1.549 m)  Body mass index is 23.73 kg/m.       Physical Examination:   General  appearance: alert, well appearing, and in no distress  Psych: mood appropriate, normal affect  Skin: warm & dry   Cardiovascular: normal heart rate noted  Respiratory: normal respiratory effort, no distress  Abdomen: soft, non-tender   Pelvic: left labia majora with 2x2cm area with raised white plaque-like lesion, on right side of lesion, surrounding pink irritation noted.  Within plaque- fissure noted and pt reports pain with palpation of fissure.  Similar though smaller ~0.5cm white lesion noted at rectum.  Extremities: no edema, no calf tenderness bilaterally  Chaperone: Levy Pupa  VULVAR BIOPSY NOTE The indications for vulvar biopsy (rule out neoplasia, establish diagnosis) were reviewed.   Risks of the biopsy including pain, bleeding, infection, inadequate specimen, scarring and need for additional procedures  were discussed. The patient stated understanding and agreed to undergo procedure today. Consent was signed,  time out performed.  The patient's vulva was prepped with Betadine. 1% lidocaine was injected into left vulva. A 3-mm punch biopsy was done, biopsy tissue was picked up with sterile forceps and sterile scissors were used to excise the lesion.  Small bleeding was noted and hemostasis was achieved using silver nitrate sticks.  The patient tolerated the procedure well.   Assessment & Plan:  1) Vulvar lesion- DDX includes lichen planus, VIN or potential for carcinoma -vulvar biopsy obtained to r/o underlying carcinoma and for diagnosis -Post-procedure instructions  (pelvic rest for one week) were given to the patient. The patient is to call with heavy bleeding, fever greater than 100.4, foul smelling vaginal discharge or other concerns.  -further management pending results of biopsy -plan to continue with temovate -suspect pain due to fissure within plaque- reviewed conservative management -F/U in 6wks  Return in about 6 weeks (around 03/10/2022) for Medication follow  up.   Janyth Pupa, DO Attending [place], [facility] for [employer], [place]

## 2022-01-28 DIAGNOSIS — Z029 Encounter for administrative examinations, unspecified: Secondary | ICD-10-CM

## 2022-01-29 LAB — SURGICAL PATHOLOGY

## 2022-02-03 ENCOUNTER — Ambulatory Visit: Payer: BC Managed Care – PPO | Admitting: Obstetrics & Gynecology

## 2022-02-03 ENCOUNTER — Encounter: Payer: Self-pay | Admitting: Obstetrics & Gynecology

## 2022-02-03 VITALS — BP 102/72 | HR 71 | Wt 129.0 lb

## 2022-02-03 DIAGNOSIS — D071 Carcinoma in situ of vulva: Secondary | ICD-10-CM | POA: Diagnosis not present

## 2022-02-03 MED ORDER — LIDOCAINE HCL URETHRAL/MUCOSAL 2 % EX PRSY: 1.0000 | PREFILLED_SYRINGE | CUTANEOUS | 11 refills | Status: DC | PRN

## 2022-02-03 NOTE — Progress Notes (Signed)
Follow up appointment for results: Vulvar biopsy  Chief Complaint  Patient presents with   Discuss surgery    Blood pressure 102/72, pulse 71, weight 129 lb (58.5 kg).    VINIII, multifocal  MEDS ordered this encounter: Meds ordered this encounter  Medications   lidocaine (XYLOCAINE) 2 % jelly    Sig: Apply 1 Application topically as needed. Apply to area prn    Dispense:  20 mL    Refill:  11    Orders for this encounter: No orders of the defined types were placed in this encounter.   Impression + Management Plan   ICD-10-CM   1. VIN III (vulvar intraepithelial neoplasia III), multifocal  D07.1    laser of the vulva 02/25/22      Follow Up: Return in about 1 month (around 03/09/2022) for [place], with Dr Elonda Husky.     All questions were answered.  Past Medical History:  Diagnosis Date   Diverticulitis    History of asplenia 07/10/2019    Past Surgical History:  Procedure Laterality Date   ABDOMINAL HYSTERECTOMY     COLON SURGERY     LAPAROSCOPIC BILATERAL SALPINGO OOPHERECTOMY Bilateral    REMOVAL OF GASTROINTESTINAL STOMATIC  TUMOR OF STOMACH     SMALL INTESTINE SURGERY      OB History     Gravida  3   Para  3   Term  2   Preterm  1   AB      Living  2      SAB      IAB      Ectopic      Multiple      Live Births  1           Allergies  Allergen Reactions   Other Hives    Steroids     Social History   Socioeconomic History   Marital status: Significant Other    Spouse name: Not on file   Number of children: Not on file   Years of education: Not on file   Highest education level: Not on file  Occupational History   Not on file  Tobacco Use   Smoking status: Every Day    Packs/day: 0.50    Types: Cigarettes    Start date: 04/06/2013   Smokeless tobacco: Never   Tobacco comments:    Smokes E cig  Vaping Use   Vaping Use: Never used  Substance and Sexual Activity   Alcohol use: No   Drug use: No   Sexual  activity: Not Currently    Birth control/protection: Surgical    Comment: hyst  Other Topics Concern   Not on file  Social History Narrative   Not on file   Social Determinants of Health   Financial Resource Strain: Not on file  Food Insecurity: Not on file  Transportation Needs: Not on file  Physical Activity: Not on file  Stress: Not on file  Social Connections: Not on file    Family History  Problem Relation Age of Onset   Emphysema Paternal Grandfather    Stroke Paternal Grandmother    Cancer Maternal Grandmother        renal and uterine cancer   Heart attack Maternal Grandmother    Other Maternal Grandfather        accident   Cancer Mother 15       pancreatic cancer   Other Sister        house fire  Heart attack Sister    Heart attack Daughter

## 2022-02-04 ENCOUNTER — Telehealth: Payer: Self-pay

## 2022-02-04 ENCOUNTER — Encounter: Payer: Self-pay | Admitting: Obstetrics & Gynecology

## 2022-02-04 MED ORDER — LIDOCAINE 5 % EX OINT: 1.0000 | TOPICAL_OINTMENT | CUTANEOUS | 0 refills | Status: DC | PRN

## 2022-02-04 NOTE — Telephone Encounter (Signed)
Pt called a stated that she was in on yesterday and Dr. Elonda Husky prescribed her Lidocaine 2% Jelly, it is not available at any drugstores. Patient wants to know if there is anything else that can be called in.

## 2022-02-04 NOTE — Telephone Encounter (Signed)
[facility] out of stock of 2% lidocaine.  Only have 5% which is $300.  [facility] has 5% Lidocaine Ointment for $19.59.  Will be able to pick up Friday.

## 2022-02-19 NOTE — Patient Instructions (Signed)
Peggy Franklin  02/19/2022     '@PREFPERIOPPHARMACY'$ @   Your procedure is scheduled on  02/25/2022.   Report to [facility] at  [place]  A.M.   Call this number if you have problems the morning of surgery:  [phone]   Remember:  Do not eat after midnight.   You may drink clear liquids until 0845 am on 02/25/2022.      Clear liquids allowed are:                    Water, Juice (No red color; non-citric and without pulp; diabetics please choose diet or no sugar options), Carbonated beverages (diabetics please choose diet or no sugar options), Clear Tea (No creamer, milk, or cream, including half & half and powdered creamer), Black Coffee Only (No creamer, milk or cream, including half & half and powdered creamer), Plain Jell-O Only (No red color; diabetics please choose no sugar options), Clear Sports drink (No red color; diabetics please choose diet or no sugar options), and Plain Popsicles Only (No red color; diabetics please choose no sugar options)        At 0845 am on 02/25/2022 drink your carb drink. You can have nothing else to drink after this.    Take these medicines the morning of surgery with A SIP OF WATER                                                          protonix.     Do not wear jewelry, make-up or nail polish.  Do not wear lotions, powders, or perfumes, or deodorant.  Do not shave 48 hours prior to surgery.  Men may shave face and neck.  Do not bring valuables to the hospital.  [facility] is not responsible for any belongings or valuables.  Contacts, dentures or bridgework may not be worn into surgery.  Leave your suitcase in the car.  After surgery it may be brought to your room.  For patients admitted to the hospital, discharge time will be determined by your treatment team.  Patients discharged the day of surgery will not be allowed to drive home and must have someone with them for 24 hours.    Special instructions:   DO NOT smoke  tobacco or vape for 24 hours before your procedure.  Please read over the following fact sheets that you were given. Coughing and Deep Breathing, Surgical Site Infection Prevention, Anesthesia Post-op Instructions, and Care and Recovery After Surgery      Vulva Biopsy, Care After After a vulva biopsy, it is common to have: Slight bleeding from the biopsy site. Soreness or slight pain at the biopsy site. Follow these instructions at home: Your doctor may give you more instructions. If you have problems, contact your doctor. Biopsy site care  Follow instructions from your doctor about how to take care of your biopsy site. Make sure you: Clean the area using water and mild soap two times a day or as told by your doctor. Gently pat the area dry. You may shower 24 hours after the procedure. If you were prescribed an antibiotic ointment, apply it as told by your doctor. Do not stop  using the antibiotic even if your condition gets better. If told by your doctor, take a warm water bath (sitz bath) to help with pain and soreness. A sitz bath is taken while you are sitting down. Do this as often as told by your doctor. The water should only come up to your hips and cover your butt. You may pat the area dry with a soft, clean towel. Leave stitches (sutures) or skin glue in place for at least 2 weeks. Leave tape strips alone unless you are told to take them off. You may trim the edges of the tape strips if they curl up. Check your biopsy area every day for signs of infection. It may be helpful to use a handheld mirror to do this. Check for: Redness, swelling, or more pain. More fluid or blood. Warmth. Pus or a bad smell. Do not rub the biopsy area after peeing (urinating). Gently pat the area dry, or use a bottle filled with warm water (peri bottle) to clean the area. Gently wipe from front to back. Lifestyle Wear loose, cotton underwear. Do not wear tight pants. For at least 1 week or until  your doctor says it is okay: Do not use a tampon, douche, or put anything in your vagina. Do not have sex. Until your doctor says it is okay: Do not exercise. Do not swim or use a hot tub. General instructions Take over-the-counter and prescription medicines only as told by your doctor. Drink enough fluid to keep your pee (urine) pale yellow. Use a sanitary pad until the bleeding stops. If told, put ice on the biopsy site. To do this: Place ice in a plastic bag. Place a towel between your skin and the bag. Leave the ice on for 20 minutes, 2-3 times a day. Take off the ice if your skin turns bright red. This is very important. If you cannot feel pain, heat, or cold, you have a greater risk of damage to the area. Keep all follow-up visits. Contact a doctor if: You have redness, swelling, or more pain around your biopsy site. You have more fluid or blood coming from your biopsy site. Your biopsy site feels warm when you touch it. Medicines or ice packs do not help with your pain. You have a fever or chills. Get help right away if: You have a lot of bleeding from the vulva. You have pus or a bad smell coming from your biopsy site. You have pain in your belly (abdomen). Summary After the procedure, it is common to have slight bleeding and soreness at the biopsy site. Follow all instructions as told by your doctor. Take sitz baths as told by your doctor. Leave any stitches in place. Check your biopsy site for infection. Signs include redness, swelling, more pain, more fluid or blood, or warmth. Get help right away if you have a lot of bleeding, pus or a bad smell, or pain in your belly. This information is not intended to replace advice given to you by your health care provider. Make sure you discuss any questions you have with your health care provider. Document Revised: 02/25/2021 Document Reviewed: 02/25/2021 Elsevier Patient Education  [place] Anesthesia, Adult,  Care After The following information offers guidance on how to care for yourself after your procedure. Your health care provider may also give you more specific instructions. If you have problems or questions, contact your health care provider. What can I expect after the procedure? After the procedure, it is  common for people to: Have pain or discomfort at the IV site. Have nausea or vomiting. Have a sore throat or hoarseness. Have trouble concentrating. Feel cold or chills. Feel weak, sleepy, or tired (fatigue). Have soreness and body aches. These can affect parts of the body that were not involved in surgery. Follow these instructions at home: For the time period you were told by your health care provider:  Rest. Do not participate in activities where you could fall or become injured. Do not drive or use machinery. Do not drink alcohol. Do not take sleeping pills or medicines that cause drowsiness. Do not make important decisions or sign legal documents. Do not take care of children on your own. General instructions Drink enough fluid to keep your urine pale yellow. If you have sleep apnea, surgery and certain medicines can increase your risk for breathing problems. Follow instructions from your health care provider about wearing your sleep device: Anytime you are sleeping, including during daytime naps. While taking prescription pain medicines, sleeping medicines, or medicines that make you drowsy. Return to your normal activities as told by your health care provider. Ask your health care provider what activities are safe for you. Take over-the-counter and prescription medicines only as told by your health care provider. Do not use any products that contain nicotine or tobacco. These products include cigarettes, chewing tobacco, and vaping devices, such as e-cigarettes. These can delay incision healing after surgery. If you need help quitting, ask your health care provider. Contact a  health care provider if: You have nausea or vomiting that does not get better with medicine. You vomit every time you eat or drink. You have pain that does not get better with medicine. You cannot urinate or have bloody urine. You develop a skin rash. You have a fever. Get help right away if: You have trouble breathing. You have chest pain. You vomit blood. These symptoms may be an emergency. Get help right away. Call 911. Do not wait to see if the symptoms will go away. Do not drive yourself to the hospital. Summary After the procedure, it is common to have a sore throat, hoarseness, nausea, vomiting, or to feel weak, sleepy, or fatigue. For the time period you were told by your health care provider, do not drive or use machinery. Get help right away if you have difficulty breathing, have chest pain, or vomit blood. These symptoms may be an emergency. This information is not intended to replace advice given to you by your health care provider. Make sure you discuss any questions you have with your health care provider. Document Revised: 09/05/2021 Document Reviewed: 09/05/2021 Elsevier Patient Education  [place]. How to Use Chlorhexidine Before Surgery Chlorhexidine gluconate (CHG) is a germ-killing (antiseptic) solution that is used to clean the skin. It can get rid of the bacteria that normally live on the skin and can keep them away for about 24 hours. To clean your skin with CHG, you may be given: A CHG solution to use in the shower or as part of a sponge bath. A prepackaged cloth that contains CHG. Cleaning your skin with CHG may help lower the risk for infection: While you are staying in the intensive care unit of the hospital. If you have a vascular access, such as a central line, to provide short-term or long-term access to your veins. If you have a catheter to drain urine from your bladder. If you are on a ventilator. A ventilator is a machine that  helps you breathe  by moving air in and out of your lungs. After surgery. What are the risks? Risks of using CHG include: A skin reaction. Hearing loss, if CHG gets in your ears and you have a perforated eardrum. Eye injury, if CHG gets in your eyes and is not rinsed out. The CHG product catching fire. Make sure that you avoid smoking and flames after applying CHG to your skin. Do not use CHG: If you have a chlorhexidine allergy or have previously reacted to chlorhexidine. On babies younger than 36 months of age. How to use CHG solution Use CHG only as told by your health care provider, and follow the instructions on the label. Use the full amount of CHG as directed. Usually, this is one bottle. During a shower Follow these steps when using CHG solution during a shower (unless your health care provider gives you different instructions): Start the shower. Use your normal soap and shampoo to wash your face and hair. Turn off the shower or move out of the shower stream. Pour the CHG onto a clean washcloth. Do not use any type of brush or rough-edged sponge. Starting at your neck, lather your body down to your toes. Make sure you follow these instructions: If you will be having surgery, pay special attention to the part of your body where you will be having surgery. Scrub this area for at least 1 minute. Do not use CHG on your head or face. If the solution gets into your ears or eyes, rinse them well with water. Avoid your genital area. Avoid any areas of skin that have broken skin, cuts, or scrapes. Scrub your back and under your arms. Make sure to wash skin folds. Let the lather sit on your skin for 1-2 minutes or as long as told by your health care provider. Thoroughly rinse your entire body in the shower. Make sure that all body creases and crevices are rinsed well. Dry off with a clean towel. Do not put any substances on your body afterward--such as powder, lotion, or perfume--unless you are told to do so  by your health care provider. Only use lotions that are recommended by the manufacturer. Put on clean clothes or pajamas. If it is the night before your surgery, sleep in clean sheets.  During a sponge bath Follow these steps when using CHG solution during a sponge bath (unless your health care provider gives you different instructions): Use your normal soap and shampoo to wash your face and hair. Pour the CHG onto a clean washcloth. Starting at your neck, lather your body down to your toes. Make sure you follow these instructions: If you will be having surgery, pay special attention to the part of your body where you will be having surgery. Scrub this area for at least 1 minute. Do not use CHG on your head or face. If the solution gets into your ears or eyes, rinse them well with water. Avoid your genital area. Avoid any areas of skin that have broken skin, cuts, or scrapes. Scrub your back and under your arms. Make sure to wash skin folds. Let the lather sit on your skin for 1-2 minutes or as long as told by your health care provider. Using a different clean, wet washcloth, thoroughly rinse your entire body. Make sure that all body creases and crevices are rinsed well. Dry off with a clean towel. Do not put any substances on your body afterward--such as powder, lotion, or perfume--unless you are told  to do so by your health care provider. Only use lotions that are recommended by the manufacturer. Put on clean clothes or pajamas. If it is the night before your surgery, sleep in clean sheets. How to use CHG prepackaged cloths Only use CHG cloths as told by your health care provider, and follow the instructions on the label. Use the CHG cloth on clean, dry skin. Do not use the CHG cloth on your head or face unless your health care provider tells you to. When washing with the CHG cloth: Avoid your genital area. Avoid any areas of skin that have broken skin, cuts, or scrapes. Before  surgery Follow these steps when using a CHG cloth to clean before surgery (unless your health care provider gives you different instructions): Using the CHG cloth, vigorously scrub the part of your body where you will be having surgery. Scrub using a back-and-forth motion for 3 minutes. The area on your body should be completely wet with CHG when you are done scrubbing. Do not rinse. Discard the cloth and let the area air-dry. Do not put any substances on the area afterward, such as powder, lotion, or perfume. Put on clean clothes or pajamas. If it is the night before your surgery, sleep in clean sheets.  For general bathing Follow these steps when using CHG cloths for general bathing (unless your health care provider gives you different instructions). Use a separate CHG cloth for each area of your body. Make sure you wash between any folds of skin and between your fingers and toes. Wash your body in the following order, switching to a new cloth after each step: The front of your neck, shoulders, and chest. Both of your arms, under your arms, and your hands. Your stomach and groin area, avoiding the genitals. Your right leg and foot. Your left leg and foot. The back of your neck, your back, and your buttocks. Do not rinse. Discard the cloth and let the area air-dry. Do not put any substances on your body afterward--such as powder, lotion, or perfume--unless you are told to do so by your health care provider. Only use lotions that are recommended by the manufacturer. Put on clean clothes or pajamas. Contact a health care provider if: Your skin gets irritated after scrubbing. You have questions about using your solution or cloth. You swallow any chlorhexidine. Call your local poison control center (1-(504) 850-3088 in the [place] Get help right away if: Your eyes itch badly, or they become very red or swollen. Your skin itches badly and is red or swollen. Your hearing changes. You have trouble  seeing. You have swelling or tingling in your mouth or throat. You have trouble breathing. These symptoms may represent a serious problem that is an emergency. Do not wait to see if the symptoms will go away. Get medical help right away. Call your local emergency services (911 in the [place] Do not drive yourself to the hospital. Summary Chlorhexidine gluconate (CHG) is a germ-killing (antiseptic) solution that is used to clean the skin. Cleaning your skin with CHG may help to lower your risk for infection. You may be given CHG to use for bathing. It may be in a bottle or in a prepackaged cloth to use on your skin. Carefully follow your health care provider's instructions and the instructions on the product label. Do not use CHG if you have a chlorhexidine allergy. Contact your health care provider if your skin gets irritated after scrubbing. This information is not intended to replace  advice given to you by your health care provider. Make sure you discuss any questions you have with your health care provider. Document Revised: 10/06/2021 Document Reviewed: 08/19/2020 Elsevier Patient Education  [place].

## 2022-02-20 ENCOUNTER — Encounter (HOSPITAL_COMMUNITY): Payer: Self-pay

## 2022-02-20 ENCOUNTER — Encounter (HOSPITAL_COMMUNITY)
Admission: RE | Admit: 2022-02-20 | Discharge: 2022-02-20 | Disposition: A | Discharge status: Home / Self Care | Payer: BC Managed Care – PPO | Source: Ambulatory Visit | Attending: Obstetrics & Gynecology | Admitting: Obstetrics & Gynecology

## 2022-02-20 VITALS — BP 117/99 | HR 75 | Temp 97.6°F | Resp 18 | Ht 61.0 in | Wt 129.0 lb

## 2022-02-20 DIAGNOSIS — D071 Carcinoma in situ of vulva: Secondary | ICD-10-CM | POA: Diagnosis not present

## 2022-02-20 DIAGNOSIS — Z01812 Encounter for preprocedural laboratory examination: Secondary | ICD-10-CM | POA: Diagnosis not present

## 2022-02-20 DIAGNOSIS — Z01818 Encounter for other preprocedural examination: Secondary | ICD-10-CM

## 2022-02-20 HISTORY — DX: Malignant (primary) neoplasm, unspecified: C80.1

## 2022-02-20 HISTORY — DX: Gastro-esophageal reflux disease without esophagitis: K21.9

## 2022-02-20 LAB — CBC WITH DIFFERENTIAL/PLATELET
Abs Immature Granulocytes: 0.01 10*3/uL (ref 0.00–0.07)
Basophils Absolute: 0 10*3/uL (ref 0.0–0.1)
Basophils Relative: 0 %
Eosinophils Absolute: 0 10*3/uL (ref 0.0–0.5)
Eosinophils Relative: 1 %
HCT: 40.3 % (ref 36.0–46.0)
Hemoglobin: 13.6 g/dL (ref 12.0–15.0)
Immature Granulocytes: 0 %
Lymphocytes Relative: 31 %
Lymphs Abs: 1.5 10*3/uL (ref 0.7–4.0)
MCH: 31.3 pg (ref 26.0–34.0)
MCHC: 33.7 g/dL (ref 30.0–36.0)
MCV: 92.9 fL (ref 80.0–100.0)
Monocytes Absolute: 0.3 10*3/uL (ref 0.1–1.0)
Monocytes Relative: 7 %
Neutro Abs: 2.9 10*3/uL (ref 1.7–7.7)
Neutrophils Relative %: 61 %
Platelets: 182 10*3/uL (ref 150–400)
RBC: 4.34 MIL/uL (ref 3.87–5.11)
RDW: 12.4 % (ref 11.5–15.5)
WBC: 4.8 10*3/uL (ref 4.0–10.5)
nRBC: 0 % (ref 0.0–0.2)

## 2022-02-20 LAB — COMPREHENSIVE METABOLIC PANEL
ALT: 10 U/L (ref 0–44)
AST: 15 U/L (ref 15–41)
Albumin: 4 g/dL (ref 3.5–5.0)
Alkaline Phosphatase: 49 U/L (ref 38–126)
Anion gap: 7 (ref 5–15)
BUN: 13 mg/dL (ref 6–20)
CO2: 26 mmol/L (ref 22–32)
Calcium: 8.9 mg/dL (ref 8.9–10.3)
Chloride: 105 mmol/L (ref 98–111)
Creatinine, Ser: 0.59 mg/dL (ref 0.44–1.00)
GFR, Estimated: >60 mL/min (ref >60)
Glucose, Bld: 82 mg/dL (ref 70–99)
Potassium: 3.8 mmol/L (ref 3.5–5.1)
Sodium: 138 mmol/L (ref 135–145)
Total Bilirubin: 0.7 mg/dL (ref 0.3–1.2)
Total Protein: 6.7 g/dL (ref 6.5–8.1)

## 2022-02-20 LAB — URINALYSIS, ROUTINE W REFLEX MICROSCOPIC
Bacteria, UA: NONE SEEN
Bilirubin Urine: NEGATIVE
Glucose, UA: NEGATIVE mg/dL
Hgb urine dipstick: NEGATIVE
Ketones, ur: NEGATIVE mg/dL
Nitrite: NEGATIVE
Protein, ur: NEGATIVE mg/dL
Specific Gravity, Urine: 1.012 (ref 1.005–1.030)
pH: 7 (ref 5.0–8.0)

## 2022-03-02 ENCOUNTER — Ambulatory Visit: Payer: BC Managed Care – PPO | Admitting: Nurse Practitioner

## 2022-03-02 ENCOUNTER — Encounter: Payer: Self-pay | Admitting: Nurse Practitioner

## 2022-03-02 VITALS — BP 110/76 | Ht 61.0 in | Wt 129.0 lb

## 2022-03-02 DIAGNOSIS — R051 Acute cough: Secondary | ICD-10-CM | POA: Diagnosis not present

## 2022-03-02 MED ORDER — BENZONATATE 100 MG PO CAPS: 100.0000 mg | ORAL_CAPSULE | Freq: Two times a day (BID) | ORAL | 0 refills | Status: DC | PRN

## 2022-03-02 NOTE — Progress Notes (Addendum)
Subjective:    Patient ID: Peggy Franklin, female    DOB: [birth year], 51 y.o.   MRN: [id number]  HPI Patient presents to clinic today with complaints of dry cough, chills, headaches x4 days.  Patient denies any fevers shortness of breath or difficulty breathing.  Patient states that she has tried Mucinex and a cough suppressant over-the-counter which have not been helpful.  Feels like she has bronchitis- sore in ribs from coughing  Patient concerned about GYN surgery coming up next week and would like to know if she can continue with her surgery since she is sick.  Patient states she prefers not to reschedule since this already has been scheduled as before.   Review of Systems  All other systems reviewed and are negative.      Objective:   Physical Exam Vitals reviewed.  Constitutional:      General: She is not in acute distress.    Appearance: Normal appearance. She is normal weight. She is not ill-appearing, toxic-appearing or diaphoretic.  HENT:     Head: Normocephalic and atraumatic.     Right Ear: Tympanic membrane, ear canal and external ear normal. There is no impacted cerumen.     Left Ear: Tympanic membrane, ear canal and external ear normal. There is no impacted cerumen.     Nose: Rhinorrhea present. No congestion.     Mouth/Throat:     Mouth: Mucous membranes are moist.     Pharynx: Oropharynx is clear. No oropharyngeal exudate or posterior oropharyngeal erythema.  Eyes:     General:        Right eye: No discharge.        Left eye: No discharge.     Extraocular Movements: Extraocular movements intact.     Conjunctiva/sclera: Conjunctivae normal.     Pupils: Pupils are equal, round, and reactive to light.  Neck:     Vascular: No carotid bruit.  Cardiovascular:     Rate and Rhythm: Normal rate and regular rhythm.     Pulses: Normal pulses.     Heart sounds: Normal heart sounds. No murmur heard. Pulmonary:     Effort: Pulmonary effort is normal. No respiratory  distress.     Breath sounds: Normal breath sounds. No wheezing.  Musculoskeletal:     Cervical back: Normal range of motion and neck supple. No rigidity or tenderness.     Comments: Grossly intact  Lymphadenopathy:     Cervical: Cervical adenopathy present.  Skin:    General: Skin is warm.     Capillary Refill: Capillary refill takes less than 2 seconds.  Neurological:     Mental Status: She is alert.     Comments: Grossly intact  Psychiatric:        Mood and Affect: Mood normal.        Behavior: Behavior normal.        Assessment & Plan:   1. Acute cough -Likely viral -We will also test for COVID - benzonatate (TESSALON) 100 MG capsule; Take 1 capsule (100 mg total) by mouth 2 (two) times daily as needed for cough.  Dispense: 20 capsule; Refill: 0 - Novel Coronavirus, NAA ([facility]) -Encourage patient to follow-up with GYN to determine if she could still have surgery -Return to clinic in 3 to 4 days if still not feeling better -Go to emergency roan or return to clinic if symptoms worsen    Note:  This document was prepared using Dragon voice recognition software and may include unintentional  dictation errors. Note - This record has been created using [employer].  Chart creation errors have been sought, but may not always  have been located. Such creation errors do not reflect on  the standard of medical care.

## 2022-03-03 LAB — NOVEL CORONAVIRUS, NAA: SARS-CoV-2, NAA: NOT DETECTED

## 2022-03-06 ENCOUNTER — Encounter (HOSPITAL_COMMUNITY): Payer: Self-pay

## 2022-03-06 ENCOUNTER — Encounter (HOSPITAL_COMMUNITY)
Admission: RE | Admit: 2022-03-06 | Discharge: 2022-03-06 | Disposition: A | Discharge status: Home / Self Care | Payer: BC Managed Care – PPO | Source: Ambulatory Visit | Attending: Obstetrics & Gynecology | Admitting: Obstetrics & Gynecology

## 2022-03-06 ENCOUNTER — Other Ambulatory Visit: Payer: Self-pay | Admitting: Obstetrics & Gynecology

## 2022-03-06 DIAGNOSIS — Z01818 Encounter for other preprocedural examination: Secondary | ICD-10-CM

## 2022-03-10 ENCOUNTER — Ambulatory Visit: Payer: BC Managed Care – PPO | Admitting: Adult Health

## 2022-03-10 ENCOUNTER — Encounter: Payer: Self-pay | Admitting: Obstetrics & Gynecology

## 2022-03-10 ENCOUNTER — Encounter: Payer: BC Managed Care – PPO | Admitting: Obstetrics & Gynecology

## 2022-03-10 DIAGNOSIS — Z029 Encounter for administrative examinations, unspecified: Secondary | ICD-10-CM

## 2022-03-11 ENCOUNTER — Ambulatory Visit (HOSPITAL_COMMUNITY)
Admission: RE | Admit: 2022-03-11 | Discharge: 2022-03-11 | Disposition: A | Discharge status: Home / Self Care | Payer: BC Managed Care – PPO | Attending: Obstetrics & Gynecology | Admitting: Obstetrics & Gynecology

## 2022-03-11 ENCOUNTER — Other Ambulatory Visit: Payer: Self-pay

## 2022-03-11 ENCOUNTER — Ambulatory Visit (HOSPITAL_COMMUNITY): Payer: BC Managed Care – PPO | Admitting: Certified Registered Nurse Anesthetist

## 2022-03-11 ENCOUNTER — Encounter (HOSPITAL_COMMUNITY)
Admission: RE | Disposition: A | Discharge status: Home / Self Care | Payer: Self-pay | Source: Home / Self Care | Attending: Obstetrics & Gynecology

## 2022-03-11 ENCOUNTER — Encounter (HOSPITAL_COMMUNITY): Payer: Self-pay | Admitting: Obstetrics & Gynecology

## 2022-03-11 DIAGNOSIS — Z01818 Encounter for other preprocedural examination: Secondary | ICD-10-CM

## 2022-03-11 DIAGNOSIS — L720 Epidermal cyst: Secondary | ICD-10-CM | POA: Insufficient documentation

## 2022-03-11 DIAGNOSIS — F17210 Nicotine dependence, cigarettes, uncomplicated: Secondary | ICD-10-CM | POA: Diagnosis not present

## 2022-03-11 DIAGNOSIS — K219 Gastro-esophageal reflux disease without esophagitis: Secondary | ICD-10-CM | POA: Diagnosis not present

## 2022-03-11 DIAGNOSIS — D071 Carcinoma in situ of vulva: Secondary | ICD-10-CM

## 2022-03-11 DIAGNOSIS — L723 Sebaceous cyst: Secondary | ICD-10-CM | POA: Diagnosis not present

## 2022-03-11 DIAGNOSIS — N9089 Other specified noninflammatory disorders of vulva and perineum: Secondary | ICD-10-CM | POA: Diagnosis not present

## 2022-03-11 DIAGNOSIS — N762 Acute vulvitis: Secondary | ICD-10-CM | POA: Diagnosis not present

## 2022-03-11 DIAGNOSIS — L903 Atrophoderma of Pasini and Pierini: Secondary | ICD-10-CM

## 2022-03-11 HISTORY — PX: VULVAR LESION REMOVAL: SHX5391

## 2022-03-11 HISTORY — PX: EXCISION VAGINAL CYST: SHX5825

## 2022-03-11 SURGERY — VULVAR LESION
Anesthesia: General | Site: Vulva

## 2022-03-11 MED ORDER — DEXAMETHASONE SODIUM PHOSPHATE 10 MG/ML IJ SOLN
INTRAMUSCULAR | Status: AC
  Filled 2022-03-11: qty 1

## 2022-03-11 MED ORDER — OXYCODONE HCL 5 MG PO TABS: 5.0000 mg | ORAL_TABLET | Freq: Once | ORAL | Status: DC | PRN

## 2022-03-11 MED ORDER — ONDANSETRON HCL 4 MG/2ML IJ SOLN
INTRAMUSCULAR | Status: DC | PRN
  Administered 2022-03-11: 4 mg via INTRAVENOUS

## 2022-03-11 MED ORDER — FENTANYL CITRATE (PF) 100 MCG/2ML IJ SOLN
INTRAMUSCULAR | Status: AC
  Filled 2022-03-11: qty 2

## 2022-03-11 MED ORDER — KETOROLAC TROMETHAMINE 30 MG/ML IJ SOLN: 30.0000 mg | Freq: Once | INTRAMUSCULAR | Status: DC

## 2022-03-11 MED ORDER — CEFAZOLIN SODIUM-DEXTROSE 2-4 GM/100ML-% IV SOLN
INTRAVENOUS | Status: AC
  Filled 2022-03-11: qty 100

## 2022-03-11 MED ORDER — ONDANSETRON HCL 4 MG/2ML IJ SOLN: 4.0000 mg | Freq: Once | INTRAMUSCULAR | Status: DC | PRN

## 2022-03-11 MED ORDER — CEFAZOLIN SODIUM-DEXTROSE 2-4 GM/100ML-% IV SOLN: 2.0000 g | Freq: Once | INTRAVENOUS | Status: DC

## 2022-03-11 MED ORDER — HYDROCODONE-ACETAMINOPHEN 5-325 MG PO TABS: 1.0000 | ORAL_TABLET | Freq: Four times a day (QID) | ORAL | 0 refills | Status: DC | PRN

## 2022-03-11 MED ORDER — LIDOCAINE HCL (PF) 2 % IJ SOLN
INTRAMUSCULAR | Status: AC
  Filled 2022-03-11: qty 5

## 2022-03-11 MED ORDER — KETOROLAC TROMETHAMINE 30 MG/ML IJ SOLN
INTRAMUSCULAR | Status: AC
  Administered 2022-03-11: 30 mg via INTRAVENOUS
  Filled 2022-03-11: qty 1

## 2022-03-11 MED ORDER — DEXAMETHASONE SODIUM PHOSPHATE 10 MG/ML IJ SOLN: INTRAMUSCULAR | Status: DC | PRN

## 2022-03-11 MED ORDER — KETOROLAC TROMETHAMINE 30 MG/ML IJ SOLN: 30.0000 mg | Freq: Once | INTRAMUSCULAR | Status: AC

## 2022-03-11 MED ORDER — LACTATED RINGERS IV SOLN: INTRAVENOUS | Status: DC | PRN

## 2022-03-11 MED ORDER — MIDAZOLAM HCL 2 MG/2ML IJ SOLN
INTRAMUSCULAR | Status: DC | PRN
  Administered 2022-03-11: 2 mg via INTRAVENOUS

## 2022-03-11 MED ORDER — STERILE WATER FOR IRRIGATION IR SOLN
Status: DC | PRN
  Administered 2022-03-11: 1000 mL

## 2022-03-11 MED ORDER — CEFAZOLIN SODIUM-DEXTROSE 2-4 GM/100ML-% IV SOLN
2.0000 g | INTRAVENOUS | Status: AC
  Administered 2022-03-11: 2 g via INTRAVENOUS

## 2022-03-11 MED ORDER — KETOROLAC TROMETHAMINE 10 MG PO TABS: 10.0000 mg | ORAL_TABLET | Freq: Three times a day (TID) | ORAL | 0 refills | Status: DC | PRN

## 2022-03-11 MED ORDER — BUPIVACAINE LIPOSOME 1.3 % IJ SUSP
INTRAMUSCULAR | Status: DC | PRN
  Administered 2022-03-11: 20 mL

## 2022-03-11 MED ORDER — POVIDONE-IODINE 10 % EX SWAB: 2.0000 | Freq: Once | CUTANEOUS | Status: DC

## 2022-03-11 MED ORDER — SILVER SULFADIAZINE 1 % EX CREA: TOPICAL_CREAM | CUTANEOUS | 11 refills | Status: DC

## 2022-03-11 MED ORDER — FENTANYL CITRATE PF 50 MCG/ML IJ SOSY: 25.0000 ug | PREFILLED_SYRINGE | INTRAMUSCULAR | Status: DC | PRN

## 2022-03-11 MED ORDER — LIDOCAINE HCL (CARDIAC) PF 100 MG/5ML IV SOSY
PREFILLED_SYRINGE | INTRAVENOUS | Status: DC | PRN
  Administered 2022-03-11: 40 mg via INTRAVENOUS

## 2022-03-11 MED ORDER — ONDANSETRON 8 MG PO TBDP: 8.0000 mg | ORAL_TABLET | Freq: Three times a day (TID) | ORAL | 0 refills | Status: DC | PRN

## 2022-03-11 MED ORDER — SILVER SULFADIAZINE 1 % EX CREA
TOPICAL_CREAM | CUTANEOUS | Status: DC | PRN
  Administered 2022-03-11: 1 via TOPICAL

## 2022-03-11 MED ORDER — ONDANSETRON HCL 4 MG/2ML IJ SOLN
INTRAMUSCULAR | Status: AC
  Filled 2022-03-11: qty 2

## 2022-03-11 MED ORDER — SILVER SULFADIAZINE 1 % EX CREA
TOPICAL_CREAM | CUTANEOUS | Status: AC
  Filled 2022-03-11: qty 50

## 2022-03-11 MED ORDER — MIDAZOLAM HCL 2 MG/2ML IJ SOLN
INTRAMUSCULAR | Status: AC
  Filled 2022-03-11: qty 2

## 2022-03-11 MED ORDER — FENTANYL CITRATE (PF) 250 MCG/5ML IJ SOLN
INTRAMUSCULAR | Status: DC | PRN
  Administered 2022-03-11 (×4): 25 ug via INTRAVENOUS

## 2022-03-11 MED ORDER — OXYCODONE HCL 5 MG/5ML PO SOLN: 5.0000 mg | Freq: Once | ORAL | Status: DC | PRN

## 2022-03-11 MED ORDER — PROPOFOL 10 MG/ML IV BOLUS
INTRAVENOUS | Status: DC | PRN
  Administered 2022-03-11: 20 mg via INTRAVENOUS
  Administered 2022-03-11: 30 mg via INTRAVENOUS
  Administered 2022-03-11: 150 mg via INTRAVENOUS

## 2022-03-11 MED ORDER — BUPIVACAINE LIPOSOME 1.3 % IJ SUSP
INTRAMUSCULAR | Status: AC
  Filled 2022-03-11: qty 20

## 2022-03-11 SURGICAL SUPPLY — 36 items
APL SWBSTK 6 STRL LF DISP (MISCELLANEOUS) ×2
APPLICATOR COTTON TIP 6 STRL (MISCELLANEOUS) IMPLANT
APPLICATOR COTTON TIP 6IN STRL (MISCELLANEOUS) ×2
BAG HAMPER (MISCELLANEOUS) ×2 IMPLANT
BLADE SURG SZ10 CARB STEEL (BLADE) IMPLANT
CLOTH BEACON ORANGE TIMEOUT ST (SAFETY) ×2 IMPLANT
COVER LIGHT HANDLE STERIS (MISCELLANEOUS) ×4 IMPLANT
DRSG TEGADERM 2-3/8X2-3/4 SM (GAUZE/BANDAGES/DRESSINGS) IMPLANT
ELECT REM PT RETURN 9FT ADLT (ELECTROSURGICAL) ×2
ELECTRODE REM PT RTRN 9FT ADLT (ELECTROSURGICAL) ×2 IMPLANT
GAUZE 4X4 16PLY ~~LOC~~+RFID DBL (SPONGE) ×2 IMPLANT
GLOVE BIO SURGEON STRL SZ7 (GLOVE) IMPLANT
GLOVE BIOGEL PI IND STRL 7.0 (GLOVE) ×4 IMPLANT
GLOVE BIOGEL PI IND STRL 8 (GLOVE) ×2 IMPLANT
GLOVE ECLIPSE 8.0 STRL XLNG CF (GLOVE) ×4 IMPLANT
GOWN STRL REUS W/TWL LRG LVL3 (GOWN DISPOSABLE) ×2 IMPLANT
GOWN STRL REUS W/TWL XL LVL3 (GOWN DISPOSABLE) ×2 IMPLANT
KIT BLADEGUARD II DBL (SET/KITS/TRAYS/PACK) IMPLANT
KIT TURNOVER KIT A (KITS) ×2 IMPLANT
LASER FIBER DISP 1000U (UROLOGICAL SUPPLIES) ×2 IMPLANT
MARKER SKIN DUAL TIP RULER LAB (MISCELLANEOUS) ×2 IMPLANT
NDL HYPO 25X1 1.5 SAFETY (NEEDLE) IMPLANT
NEEDLE HYPO 22GX1.5 SAFETY (NEEDLE) IMPLANT
NEEDLE HYPO 25X1 1.5 SAFETY (NEEDLE) ×2 IMPLANT
PACK BASIC III (CUSTOM PROCEDURE TRAY) ×2
PACK SRG BSC III STRL LF ECLPS (CUSTOM PROCEDURE TRAY) ×2 IMPLANT
PAD ARMBOARD 7.5X6 YLW CONV (MISCELLANEOUS) ×2 IMPLANT
PREFILTER SMOKE EVAC (FILTER) ×2 IMPLANT
SCOPETTES 8  STERILE (MISCELLANEOUS) ×2
SCOPETTES 8 STERILE (MISCELLANEOUS) IMPLANT
SET BASIN LINEN APH (SET/KITS/TRAYS/PACK) ×2 IMPLANT
SHEET LAVH (DRAPES) ×2 IMPLANT
SPONGE GAUZE 2X2 8PLY STRL LF (GAUZE/BANDAGES/DRESSINGS) IMPLANT
SUT ETHILON 3 0 PS 1 (SUTURE) IMPLANT
SYR 20ML LL LF (SYRINGE) IMPLANT
TUBING SMOKE EVAC CO2 (TUBING) ×2 IMPLANT

## 2022-03-11 NOTE — Anesthesia Procedure Notes (Signed)
Procedure Name: LMA Insertion Date/Time: 03/11/2022 10:47 AM  Performed by: Karna Dupes, CRNAPre-anesthesia Checklist: Emergency Drugs available, Patient identified, Suction available and Patient being monitored Patient Re-evaluated:Patient Re-evaluated prior to induction Oxygen Delivery Method: Circle system utilized Preoxygenation: Pre-oxygenation with 100% oxygen Induction Type: IV induction LMA: LMA inserted LMA Size: 3.0 Number of attempts: 1 Placement Confirmation: positive ETCO2 and breath sounds checked- equal and bilateral Tube secured with: Tape Dental Injury: Teeth and Oropharynx as per pre-operative assessment

## 2022-03-11 NOTE — H&P (Signed)
Preoperative History and Physical  Peggy Franklin is a 51 y.o. [id number] with No LMP recorded. Patient has had a hysterectomy. admitted for a laser management of VINIII diagnosed from biopsy in the office, large confluent lesion.    PMH:    Past Medical History:  Diagnosis Date   Cancer ([place])    Diverticulitis    GERD (gastroesophageal reflux disease)    History of asplenia 07/10/2019    PSH:     Past Surgical History:  Procedure Laterality Date   ABDOMINAL HYSTERECTOMY     COLON SURGERY     LAPAROSCOPIC BILATERAL SALPINGO OOPHERECTOMY Bilateral    REMOVAL OF GASTROINTESTINAL STOMATIC  TUMOR OF STOMACH     SMALL INTESTINE SURGERY      POb/GynH:      OB History     Gravida  3   Para  3   Term  2   Preterm  1   AB      Living  2      SAB      IAB      Ectopic      Multiple      Live Births  1           SH:   Social History   Tobacco Use   Smoking status: Every Day    Packs/day: 0.50    Types: Cigarettes    Start date: 04/06/2013   Smokeless tobacco: Never   Tobacco comments:    Smokes E cig  Vaping Use   Vaping Use: Never used  Substance Use Topics   Alcohol use: No   Drug use: No    FH:    Family History  Problem Relation Age of Onset   Emphysema Paternal Grandfather    Stroke Paternal Grandmother    Cancer Maternal Grandmother        renal and uterine cancer   Heart attack Maternal Grandmother    Other Maternal Grandfather        accident   Cancer Mother 60       pancreatic cancer   Other Sister        house fire   Heart attack Sister    Heart attack Daughter      Allergies:  Allergies  Allergen Reactions   Other Hives    Steroids     Medications:      No current facility-administered medications for this encounter.  Review of Systems:   Review of Systems  Constitutional: Negative for fever, chills, weight loss, malaise/fatigue and diaphoresis.  HENT: Negative for hearing loss, ear pain, nosebleeds, congestion,  sore throat, neck pain, tinnitus and ear discharge.   Eyes: Negative for blurred vision, double vision, photophobia, pain, discharge and redness.  Respiratory: Negative for cough, hemoptysis, sputum production, shortness of breath, wheezing and stridor.   Cardiovascular: Negative for chest pain, palpitations, orthopnea, claudication, leg swelling and PND.  Gastrointestinal: Positive for abdominal pain. Negative for heartburn, nausea, vomiting, diarrhea, constipation, blood in stool and melena.  Genitourinary: Negative for dysuria, urgency, frequency, hematuria and flank pain.  Musculoskeletal: Negative for myalgias, back pain, joint pain and falls.  Skin: Negative for itching and rash.  Neurological: Negative for dizziness, tingling, tremors, sensory change, speech change, focal weakness, seizures, loss of consciousness, weakness and headaches.  Endo/Heme/Allergies: Negative for environmental allergies and polydipsia. Does not bruise/bleed easily.  Psychiatric/Behavioral: Negative for depression, suicidal ideas, hallucinations, memory loss and substance abuse. The patient is not nervous/anxious and does not  have insomnia.      PHYSICAL EXAM:  There were no vitals taken for this visit.    Vitals reviewed. Constitutional: She is oriented to person, place, and time. She appears well-developed and well-nourished.  HENT:  Head: Normocephalic and atraumatic.  Right Ear: External ear normal.  Left Ear: External ear normal.  Nose: Nose normal.  Mouth/Throat: Oropharynx is clear and moist.  Eyes: Conjunctivae and EOM are normal. Pupils are equal, round, and reactive to light. Right eye exhibits no discharge. Left eye exhibits no discharge. No scleral icterus.  Neck: Normal range of motion. Neck supple. No tracheal deviation present. No thyromegaly present.  Cardiovascular: Normal rate, regular rhythm, normal heart sounds and intact distal pulses.  Exam reveals no gallop and no friction rub.   No  murmur heard. Respiratory: Effort normal and breath sounds normal. No respiratory distress. She has no wheezes. She has no rales. She exhibits no tenderness.  GI: Soft. Bowel sounds are normal. She exhibits no distension and no mass. There is tenderness. There is no rebound and no guarding.  Genitourinary:       Vulva is large symptomatic area, VINIII Vagina is pink moist without discharge Cervix absent Uterus is uterus absent Adnexa is negative with normal sized ovaries by sonogram  Musculoskeletal: Normal range of motion. She exhibits no edema and no tenderness.  Neurological: She is alert and oriented to person, place, and time. She has normal reflexes. She displays normal reflexes. No cranial nerve deficit. She exhibits normal muscle tone. Coordination normal.  Skin: Skin is warm and dry. No rash noted. No erythema. No pallor.  Psychiatric: She has a normal mood and affect. Her behavior is normal. Judgment and thought content normal.    Labs:  Recent Results (from the past 2160 hour(s))  Lipase, blood     Status: None   Collection Time: 12/29/21  5:28 PM  Result Value Ref Range   Lipase 28 11 - 51 U/L    Comment: Performed at [facility], [address], [place], [place] [address]  Comprehensive metabolic panel     Status: None   Collection Time: 12/29/21  5:28 PM  Result Value Ref Range   Sodium 138 135 - 145 mmol/L   Potassium 3.7 3.5 - 5.1 mmol/L   Chloride 102 98 - 111 mmol/L   CO2 28 22 - 32 mmol/L   Glucose, Bld 93 70 - 99 mg/dL    Comment: Glucose reference range applies only to samples taken after fasting for at least 8 hours.   BUN 10 6 - 20 mg/dL   Creatinine, Ser 0.51 0.44 - 1.00 mg/dL   Calcium 9.0 8.9 - 10.3 mg/dL   Total Protein 7.3 6.5 - 8.1 g/dL   Albumin 4.2 3.5 - 5.0 g/dL   AST 17 15 - 41 U/L   ALT 13 0 - 44 U/L   Alkaline Phosphatase 60 38 - 126 U/L   Total Bilirubin 0.4 0.3 - 1.2 mg/dL   GFR, Estimated >60 >60 mL/min    Comment: (NOTE) Calculated  using the CKD-EPI Creatinine Equation (2021)    Anion gap 8 5 - 15    Comment: Performed at [facility], [address], [place], [place] [address]  CBC     Status: None   Collection Time: 12/29/21  5:28 PM  Result Value Ref Range   WBC 9.2 4.0 - 10.5 K/uL   RBC 4.46 3.87 - 5.11 MIL/uL   Hemoglobin 13.8 12.0 - 15.0 g/dL  HCT 41.2 36.0 - 46.0 %   MCV 92.4 80.0 - 100.0 fL   MCH 30.9 26.0 - 34.0 pg   MCHC 33.5 30.0 - 36.0 g/dL   RDW 12.6 11.5 - 15.5 %   Platelets 188 150 - 400 K/uL   nRBC 0.0 0.0 - 0.2 %    Comment: Performed at [facility], [address], [place], [place] [address]  Urinalysis, Routine w reflex microscopic     Status: Abnormal   Collection Time: 12/29/21  6:05 PM  Result Value Ref Range   Color, Urine STRAW (A) YELLOW   APPearance CLEAR CLEAR   Specific Gravity, Urine 1.004 (L) 1.005 - 1.030   pH 7.0 5.0 - 8.0   Glucose, UA NEGATIVE NEGATIVE mg/dL   Hgb urine dipstick SMALL (A) NEGATIVE   Bilirubin Urine NEGATIVE NEGATIVE   Ketones, ur NEGATIVE NEGATIVE mg/dL   Protein, ur NEGATIVE NEGATIVE mg/dL   Nitrite NEGATIVE NEGATIVE   Leukocytes,Ua SMALL (A) NEGATIVE   RBC / HPF 0-5 0 - 5 RBC/hpf   WBC, UA 0-5 0 - 5 WBC/hpf   Bacteria, UA RARE (A) NONE SEEN   Squamous Epithelial / LPF 6-10 0 - 5    Comment: Performed at [facility], [address], [place], [place] [address]  POC urine preg, ED     Status: None   Collection Time: 12/29/21  6:12 PM  Result Value Ref Range   Preg Test, Ur Negative Negative  Herpes simplex virus culture     Status: None   Collection Time: 01/21/22  4:40 PM   Specimen: Other   LA  Result Value Ref Range   HSV Culture/Type Comment     Comment: Negative No Herpes simplex virus isolated.   Surgical pathology( [facility]/ [facility])     Status: None   Collection Time: 01/27/22  2:33 PM  Result Value Ref Range   SURGICAL PATHOLOGY      SURGICAL PATHOLOGY CASE: [id number] PATIENT: Peggy Franklin Surgical Pathology  Report     Clinical History: vulvar lesion (cm)     FINAL MICROSCOPIC DIAGNOSIS:  A. VULVA, BIOPSY: - High-grade vulvar intraepithelial neoplasia, VIN-3 (high-grade dysplasia). - VIN-3 extends to the edges of the biopsy. - No invasive carcinoma.   GROSS DESCRIPTION: Received in formalin is a tan, soft tissue fragment that is submitted in toto.  Size: 0.4 x 0.3 x 0.2 cm, 1 block submitted. (KW, 01/28/2022)  Final Diagnosis performed by Claudette Laws, MD.   Electronically signed 01/29/2022 Technical and / or Professional components performed at [facility]. [facility], [place] [address], [place], [place] [address].  Immunohistochemistry Technical component (if applicable) was performed at [facility]. [address] [place], [place], [place] [address].   IMMUNOHISTOCHEMISTRY DISCLAIMER (if applicable): Some of these immunohistochemical stains may have been d eveloped and the performance characteristics determine by [facility]. Some may not have been cleared or approved by the U.S. Food and Drug Administration. The FDA has determined that such clearance or approval is not necessary. This test is used for clinical purposes. It should not be regarded as investigational or for research. This laboratory is certified under the [place] (CLIA-88) as qualified to perform high complexity clinical laboratory testing.  The controls stained appropriately.   CBC with Differential/Platelet     Status: None   Collection Time: 02/20/22  2:32 PM  Result Value Ref Range   WBC 4.8 4.0 - 10.5 K/uL  RBC 4.34 3.87 - 5.11 MIL/uL   Hemoglobin 13.6 12.0 - 15.0 g/dL   HCT 40.3 36.0 - 46.0 %   MCV 92.9 80.0 - 100.0 fL   MCH 31.3 26.0 - 34.0 pg   MCHC 33.7 30.0 - 36.0 g/dL   RDW 12.4 11.5 - 15.5 %   Platelets 182 150 - 400 K/uL   nRBC 0.0 0.0 - 0.2 %   Neutrophils Relative % 61 %   Neutro Abs 2.9 1.7 - 7.7 K/uL    Lymphocytes Relative 31 %   Lymphs Abs 1.5 0.7 - 4.0 K/uL   Monocytes Relative 7 %   Monocytes Absolute 0.3 0.1 - 1.0 K/uL   Eosinophils Relative 1 %   Eosinophils Absolute 0.0 0.0 - 0.5 K/uL   Basophils Relative 0 %   Basophils Absolute 0.0 0.0 - 0.1 K/uL   Immature Granulocytes 0 %   Abs Immature Granulocytes 0.01 0.00 - 0.07 K/uL    Comment: Performed at [facility], [address] [place], [place] [address]  Comprehensive metabolic panel     Status: None   Collection Time: 02/20/22  2:32 PM  Result Value Ref Range   Sodium 138 135 - 145 mmol/L   Potassium 3.8 3.5 - 5.1 mmol/L   Chloride 105 98 - 111 mmol/L   CO2 26 22 - 32 mmol/L   Glucose, Bld 82 70 - 99 mg/dL    Comment: Glucose reference range applies only to samples taken after fasting for at least 8 hours.   BUN 13 6 - 20 mg/dL   Creatinine, Ser 0.59 0.44 - 1.00 mg/dL   Calcium 8.9 8.9 - 10.3 mg/dL   Total Protein 6.7 6.5 - 8.1 g/dL   Albumin 4.0 3.5 - 5.0 g/dL   AST 15 15 - 41 U/L   ALT 10 0 - 44 U/L   Alkaline Phosphatase 49 38 - 126 U/L   Total Bilirubin 0.7 0.3 - 1.2 mg/dL   GFR, Estimated >60 >60 mL/min    Comment: (NOTE) Calculated using the CKD-EPI Creatinine Equation (2021)    Anion gap 7 5 - 15    Comment: Performed at [facility], [address], [place], [place] [address]  Urinalysis, Routine w reflex microscopic     Status: Abnormal   Collection Time: 02/20/22  2:32 PM  Result Value Ref Range   Color, Urine YELLOW YELLOW   APPearance HAZY (A) CLEAR   Specific Gravity, Urine 1.012 1.005 - 1.030   pH 7.0 5.0 - 8.0   Glucose, UA NEGATIVE NEGATIVE mg/dL   Hgb urine dipstick NEGATIVE NEGATIVE   Bilirubin Urine NEGATIVE NEGATIVE   Ketones, ur NEGATIVE NEGATIVE mg/dL   Protein, ur NEGATIVE NEGATIVE mg/dL   Nitrite NEGATIVE NEGATIVE   Leukocytes,Ua SMALL (A) NEGATIVE   WBC, UA 0-5 0 - 5 WBC/hpf   Bacteria, UA NONE SEEN NONE SEEN   Squamous Epithelial / LPF 6-10 0 - 5   Mucus PRESENT     Comment:  Performed at [facility], [address] [place],  [address]  Novel Coronavirus, NAA ([facility])     Status: None   Collection Time: 03/02/22 12:00 AM   Specimen: Nasopharyngeal(NP) swabs in vial transport medium   Nasopharynge  Previous  Result Value Ref Range   SARS-CoV-2, NAA Not Detected Not Detected    Comment: This nucleic acid amplification test was developed and its performance characteristics determined by [employer]. Nucleic acid amplification tests include RT-PCR and TMA. This test  has not been FDA cleared or approved. This test has been authorized by FDA under an Emergency Use Authorization (EUA). This test is only authorized for the duration of time the declaration that circumstances exist justifying the authorization of the emergency use of in vitro diagnostic tests for detection of SARS-CoV-2 virus and/or diagnosis of COVID-19 infection under section 564(b)(1) of the Act, 21 U.S.C. 356YSH-6(O) (1), unless the authorization is terminated or revoked sooner. When diagnostic testing is negative, the possibility of a false negative result should be considered in the context of a patient's recent exposures and the presence of clinical signs and symptoms consistent with COVID-19. An individual without symptoms of COVID-19 and who is not shedding SARS-CoV-2 virus wo uld expect to have a negative (not detected) result in this assay.     Results for orders placed or performed in visit on 03/02/22 (from the past 336 hour(s))  Novel Coronavirus, NAA ([facility])   Collection Time: 03/02/22 12:00 AM   Specimen: Nasopharyngeal(NP) swabs in vial transport medium   Nasopharynge  Previous  Result Value Ref Range   SARS-CoV-2, NAA Not Detected Not Detected    EKG: No orders found for this or any previous visit.  Imaging Studies: No results found.    Assessment: Vulvar dysplasia, grade III Large lesion   Plan: Laser management of Georgeanna Lea 03/11/2022 9:31 AM

## 2022-03-11 NOTE — Op Note (Signed)
Preoperative diagnosis: VINIII, large extensive lesion                                        Symptomatic left labial crural fold sebaceous cyst  Postop diagnosis same as above  Procedure: Laser management of VIN 3                    Excision of sebaceous cyst  Surgeon:  Florian Buff, MD  Anesthesia: Laryngeal mask airway  Findings: Patient presented to the office with vulvar pain and itching.  Biopsies were performed and revealed severe dysplasia of the vulva.  I was consulted and recommended laser management due to the large size of the lesion.  Additionally over the last week or so the patient is developed a tender sebaceous cyst in the left labia crural fold  Description of operation:  Patient was taken to the operating room and placed in the supine position She underwent laryngeal mask airway anesthesia She was placed in candycane stirrups She was prepped and draped in the usual sterile fashion The holmium laser was used a 1 cm margin was obtained around the vulvar lesion on the left and she had a small 1 on the right which was also managed with the laser Ablation was performed and taken down through the dermis just to the edge of the subcutaneous tissue There was no associated bleeding Silvadene cream was applied Exparel was injected in the surgical site for postoperative pain management  Attention was then turned to the sebaceous cyst The area was prepped with Betadine #10 blade was used and a total excision of the sebaceous cyst was performed Closure was managed with interrupted 3-0 Ethilon sutures 5 cc of Exparel was injected for postoperative pain management There was minimal blood loss  The patient tolerated the procedure well She received 2 g of Ancef and 30 mg of Toradol preoperatively She was taken to recovery in good stable condition all counts were correct x3 She is given local care instructions including tepid baths beginning in 3 days and placement of  Silvadene cream 3-4 times a day to the laser surgical site  She will be seen back in the office in 2 weeks at which time the sutures of the sebaceous cyst removal will  have to be removed  Florian Buff, MD 03/11/2022 11:53 AM

## 2022-03-11 NOTE — Anesthesia Preprocedure Evaluation (Signed)
Anesthesia Evaluation  Patient identified by MRN, date of birth, ID band Patient awake    Reviewed: Allergy & Precautions, H&P , NPO status , Patient's Chart, lab work & pertinent test results, reviewed documented beta blocker date and time   Airway Mallampati: II  TM Distance: >3 FB Neck ROM: full    Dental no notable dental hx.    Pulmonary neg pulmonary ROS, Current Smoker,    Pulmonary exam normal breath sounds clear to auscultation       Cardiovascular Exercise Tolerance: Good negative cardio ROS   Rhythm:regular Rate:Normal     Neuro/Psych PSYCHIATRIC DISORDERS Anxiety negative neurological ROS     GI/Hepatic Neg liver ROS, GERD  Medicated,  Endo/Other  negative endocrine ROS  Renal/GU negative Renal ROS  negative genitourinary   Musculoskeletal   Abdominal   Peds  Hematology negative hematology ROS (+)   Anesthesia Other Findings   Reproductive/Obstetrics negative OB ROS                             Anesthesia Physical Anesthesia Plan  ASA: 2  Anesthesia Plan: General and General LMA   Post-op Pain Management:    Induction:   PONV Risk Score and Plan: Ondansetron  Airway Management Planned:   Additional Equipment:   Intra-op Plan:   Post-operative Plan:   Informed Consent: I have reviewed the patients History and Physical, chart, labs and discussed the procedure including the risks, benefits and alternatives for the proposed anesthesia with the patient or authorized representative who has indicated his/her understanding and acceptance.     Dental Advisory Given  Plan Discussed with: CRNA  Anesthesia Plan Comments:         Anesthesia Quick Evaluation

## 2022-03-11 NOTE — Transfer of Care (Signed)
Immediate Anesthesia Transfer of Care Note  Patient: Peggy Franklin  Procedure(s) Performed: VULVAR BIOPSY WITH LASER ABLATION OF VULVAR (Vulva) EXCISION SEBACEOUS CYST (Left: Perineum)  Patient Location: PACU  Anesthesia Type:General  Level of Consciousness: awake  Airway & Oxygen Therapy: Patient Spontanous Breathing  Post-op Assessment: Report given to RN and Post -op Vital signs reviewed and stable  Post vital signs: Reviewed and stable  Last Vitals:  Vitals Value Taken Time  BP 103/65 03/11/22 1142  Temp 97.5   Pulse 92 03/11/22 1143  Resp 26 03/11/22 1143  SpO2 92 % 03/11/22 1143  Vitals shown include unvalidated device data.  Last Pain:  Vitals:   03/11/22 0948  TempSrc: Oral  PainSc: 4       Patients Stated Pain Goal: 9 (16/10/96 0454)  Complications: No notable events documented.

## 2022-03-12 LAB — SURGICAL PATHOLOGY

## 2022-03-12 NOTE — Anesthesia Postprocedure Evaluation (Signed)
Anesthesia Post Note  Patient: ARDINE IACOVELLI  Procedure(s) Performed: VULVAR BIOPSY WITH LASER ABLATION OF VULVAR (Vulva) EXCISION SEBACEOUS CYST (Left: Perineum)  Patient location during evaluation: Phase II Anesthesia Type: General Level of consciousness: awake Pain management: pain level controlled Vital Signs Assessment: post-procedure vital signs reviewed and stable Respiratory status: spontaneous breathing and respiratory function stable Cardiovascular status: blood pressure returned to baseline and stable Postop Assessment: no headache and no apparent nausea or vomiting Anesthetic complications: no Comments: Late entry   No notable events documented.   Last Vitals:  Vitals:   03/11/22 1200 03/11/22 1216  BP: 106/68 (!) 111/57  Pulse: 75 70  Resp: 17 18  Temp:  36.6 C  SpO2: 92% 92%    Last Pain:  Vitals:   03/11/22 1216  TempSrc: Oral  PainSc: 0-No pain                 Louann Sjogren

## 2022-03-17 ENCOUNTER — Telehealth: Payer: Self-pay

## 2022-03-17 ENCOUNTER — Encounter: Payer: Self-pay | Admitting: Obstetrics & Gynecology

## 2022-03-17 NOTE — Telephone Encounter (Signed)
Patient called and stated that she would like for a nurse to call her

## 2022-03-17 NOTE — Telephone Encounter (Signed)
Communicated with patient via mychart message pt sent.

## 2022-03-19 ENCOUNTER — Ambulatory Visit (INDEPENDENT_AMBULATORY_CARE_PROVIDER_SITE_OTHER): Payer: BC Managed Care – PPO | Admitting: Obstetrics & Gynecology

## 2022-03-19 ENCOUNTER — Encounter: Payer: Self-pay | Admitting: Obstetrics & Gynecology

## 2022-03-19 VITALS — BP 117/77 | HR 93

## 2022-03-19 DIAGNOSIS — Z9889 Other specified postprocedural states: Secondary | ICD-10-CM

## 2022-03-19 NOTE — Progress Notes (Signed)
  HPI: Patient returns for routine postoperative follow-up having undergone laser management of VINIII, symtpomatic, removal of sebaceous cyst on 03/11/22.  The patient's immediate postoperative recovery has been unremarkable. Since hospital discharge the patient reports 03/11/22.   Current Outpatient Medications: benzonatate (TESSALON) 100 MG capsule, Take 1 capsule (100 mg total) by mouth 2 (two) times daily as needed for cough., Disp: 20 capsule, Rfl: 0 clobetasol ointment (TEMOVATE) 8.63 %, Apply 1 Application topically 2 (two) times daily., Disp: 30 g, Rfl: 1 HYDROcodone-acetaminophen (NORCO/VICODIN) 5-325 MG tablet, Take 1 tablet by mouth every 6 (six) hours as needed., Disp: 15 tablet, Rfl: 0 ketorolac (TORADOL) 10 MG tablet, Take 1 tablet (10 mg total) by mouth every 8 (eight) hours as needed., Disp: 15 tablet, Rfl: 0 lidocaine (XYLOCAINE) 5 % ointment, Apply 1 Application topically as needed., Disp: 35.44 g, Rfl: 0 ondansetron (ZOFRAN-ODT) 8 MG disintegrating tablet, Take 1 tablet (8 mg total) by mouth every 8 (eight) hours as needed for nausea or vomiting., Disp: 8 tablet, Rfl: 0 pantoprazole (PROTONIX) 40 MG tablet, TAKE 1 TABLET(40 MG) BY MOUTH DAILY, Disp: 90 tablet, Rfl: 1 rosuvastatin (CRESTOR) 5 MG tablet, Take 1 tablet (5 mg total) by mouth daily., Disp: 90 tablet, Rfl: 1 silver sulfADIAZINE (SILVADENE) 1 % cream, Use to area 3-4 times daily, Disp: 50 g, Rfl: 11 valACYclovir (VALTREX) 1000 MG tablet, Take 1 tablet (1,000 mg total) by mouth 2 (two) times daily., Disp: 20 tablet, Rfl: 1  No current facility-administered medications for this visit.    Blood pressure 117/77, pulse 93.  Physical Exam: Left vulva is healing normally post laser, sloughing necrotic tissue phase Sebaceous cyst site is healing fine GV placed   Diagnostic Tests:   Pathology: Benign cyst VINIII  Impression + Management plan:    ICD-10-CM   1. Post-operative state: laser management ablation  of symptomatic VINIII  Z98.890         Medications Prescribed this encounter: No orders of the defined types were placed in this encounter.     Follow up: No follow-ups on file.    Florian Buff, MD Attending Physician for the [facility] 03/19/2022 2:02 PM

## 2022-03-27 ENCOUNTER — Ambulatory Visit (INDEPENDENT_AMBULATORY_CARE_PROVIDER_SITE_OTHER): Payer: BC Managed Care – PPO | Admitting: Obstetrics & Gynecology

## 2022-03-27 ENCOUNTER — Encounter: Payer: Self-pay | Admitting: Obstetrics & Gynecology

## 2022-03-27 VITALS — BP 97/69 | HR 75 | Ht 61.0 in | Wt 128.0 lb

## 2022-03-27 DIAGNOSIS — Z9889 Other specified postprocedural states: Secondary | ICD-10-CM

## 2022-03-27 DIAGNOSIS — Z4802 Encounter for removal of sutures: Secondary | ICD-10-CM | POA: Diagnosis not present

## 2022-03-27 NOTE — Progress Notes (Signed)
  HPI: Patient returns for routine postoperative follow-up having undergone laser management of VINIII, symtpomatic, removal of sebaceous cyst on 03/11/22.  The patient's immediate postoperative recovery has been unremarkable. Since hospital discharge the patient reports 03/11/22.   Current Outpatient Medications: benzonatate (TESSALON) 100 MG capsule, Take 1 capsule (100 mg total) by mouth 2 (two) times daily as needed for cough., Disp: 20 capsule, Rfl: 0 clobetasol ointment (TEMOVATE) 4.19 %, Apply 1 Application topically 2 (two) times daily., Disp: 30 g, Rfl: 1 HYDROcodone-acetaminophen (NORCO/VICODIN) 5-325 MG tablet, Take 1 tablet by mouth every 6 (six) hours as needed., Disp: 15 tablet, Rfl: 0 ketorolac (TORADOL) 10 MG tablet, Take 1 tablet (10 mg total) by mouth every 8 (eight) hours as needed., Disp: 15 tablet, Rfl: 0 lidocaine (XYLOCAINE) 5 % ointment, Apply 1 Application topically as needed., Disp: 35.44 g, Rfl: 0 ondansetron (ZOFRAN-ODT) 8 MG disintegrating tablet, Take 1 tablet (8 mg total) by mouth every 8 (eight) hours as needed for nausea or vomiting., Disp: 8 tablet, Rfl: 0 pantoprazole (PROTONIX) 40 MG tablet, TAKE 1 TABLET(40 MG) BY MOUTH DAILY, Disp: 90 tablet, Rfl: 1 rosuvastatin (CRESTOR) 5 MG tablet, Take 1 tablet (5 mg total) by mouth daily., Disp: 90 tablet, Rfl: 1 silver sulfADIAZINE (SILVADENE) 1 % cream, Use to area 3-4 times daily, Disp: 50 g, Rfl: 11 valACYclovir (VALTREX) 1000 MG tablet, Take 1 tablet (1,000 mg total) by mouth 2 (two) times daily., Disp: 20 tablet, Rfl: 1  No current facility-administered medications for this visit.    Blood pressure 117/77, pulse 93.  Physical Exam: Left vulva is healing normally post laser, sloughing necrotic tissue phase Sebaceous cyst site is healing fine, sutures are removed GV placed   Diagnostic Tests:   Pathology: Benign cyst VINIII  Impression + Management plan:    ICD-10-CM   1. Post-operative state: laser  management ablation of symptomatic VINIII  Z98.890     2. Visit for suture removal: sebaceous cyst  Z48.02         Medications Prescribed this encounter: No orders of the defined types were placed in this encounter.     Follow up: No follow-ups on file.

## 2022-04-03 ENCOUNTER — Ambulatory Visit: Payer: BC Managed Care – PPO | Admitting: Family Medicine

## 2022-04-03 ENCOUNTER — Encounter: Payer: Self-pay | Admitting: Family Medicine

## 2022-04-03 VITALS — BP 102/60 | Wt 129.4 lb

## 2022-04-03 DIAGNOSIS — Z23 Encounter for immunization: Secondary | ICD-10-CM | POA: Diagnosis not present

## 2022-04-03 DIAGNOSIS — Z13220 Encounter for screening for lipoid disorders: Secondary | ICD-10-CM | POA: Diagnosis not present

## 2022-04-03 DIAGNOSIS — E785 Hyperlipidemia, unspecified: Secondary | ICD-10-CM | POA: Insufficient documentation

## 2022-04-03 DIAGNOSIS — Z79899 Other long term (current) drug therapy: Secondary | ICD-10-CM | POA: Diagnosis not present

## 2022-04-03 HISTORY — DX: Hyperlipidemia, unspecified: E78.5

## 2022-04-03 MED ORDER — ROSUVASTATIN CALCIUM 5 MG PO TABS: 5.0000 mg | ORAL_TABLET | Freq: Every day | ORAL | 1 refills | Status: DC

## 2022-04-03 NOTE — Patient Instructions (Signed)
Steps to Quit Smoking Smoking tobacco is the leading cause of preventable death. It can affect almost every organ in the body. Smoking puts you and people around you at risk for many serious, long-lasting (chronic) diseases. Quitting smoking can be hard, but it is one of the best things that you can do for your health. It is never too late to quit. Do not give up if you cannot quit the first time. Some people need to try many times to quit. Do your best to stick to your quit plan, and talk with your doctor if you have any questions or concerns. How do I get ready to quit? Pick a date to quit. Set a date within the next 2 weeks to give you time to prepare. Write down the reasons why you are quitting. Keep this list in places where you will see it often. Tell your family, friends, and co-workers that you are quitting. Their support is important. Talk with your doctor about the choices that may help you quit. Find out if your health insurance will pay for these treatments. Know the people, places, things, and activities that make you want to smoke (triggers). Avoid them. What first steps can I take to quit smoking? Throw away all cigarettes at home, at work, and in your car. Throw away the things that you use when you smoke, such as ashtrays and lighters. Clean your car. Empty the ashtray. Clean your home, including curtains and carpets. What can I do to help me quit smoking? Talk with your doctor about taking medicines and seeing a counselor. You are more likely to succeed when you do both. If you are pregnant or breastfeeding: Talk with your doctor about counseling or other ways to quit smoking. Do not take medicine to help you quit smoking unless your doctor tells you to. Quit right away Quit smoking completely, instead of slowly cutting back on how much you smoke over a period of time. Stopping smoking right away may be more successful than slowly quitting. Go to counseling. In-person is best  if this is an option. You are more likely to quit if you go to counseling sessions regularly. Take medicine You may take medicines to help you quit. Some medicines need a prescription, and some you can buy over-the-counter. Some medicines may contain a drug called nicotine to replace the nicotine in cigarettes. Medicines may: Help you stop having the desire to smoke (cravings). Help to stop the problems that come when you stop smoking (withdrawal symptoms). Your doctor may ask you to use: Nicotine patches, gum, or lozenges. Nicotine inhalers or sprays. Non-nicotine medicine that you take by mouth. Find resources Find resources and other ways to help you quit smoking and remain smoke-free after you quit. They include: Online chats with a counselor. Phone quitlines. Printed self-help materials. Support groups or group counseling. Text messaging programs. Mobile phone apps. Use apps on your mobile phone or tablet that can help you stick to your quit plan. Examples of free services include Quit Guide from the CDC and smokefree.gov  What can I do to make it easier to quit?  Talk to your family and friends. Ask them to support and encourage you. Call a phone quitline, such as 1-800-QUIT-NOW, reach out to support groups, or work with a counselor. Ask people who smoke to not smoke around you. Avoid places that make you want to smoke, such as: Bars. Parties. Smoke-break areas at work. Spend time with people who do not smoke. Lower   the stress in your life. Stress can make you want to smoke. Try these things to lower stress: Getting regular exercise. Doing deep-breathing exercises. Doing yoga. Meditating. What benefits will I see if I quit smoking? Over time, you may have: A better sense of smell and taste. Less coughing and sore throat. A slower heart rate. Lower blood pressure. Clearer skin. Better breathing. Fewer sick days. Summary Quitting smoking can be hard, but it is one of  the best things that you can do for your health. Do not give up if you cannot quit the first time. Some people need to try many times to quit. When you decide to quit smoking, make a plan to help you succeed. Quit smoking right away, not slowly over a period of time. When you start quitting, get help and support to keep you smoke-free. This information is not intended to replace advice given to you by your health care provider. Make sure you discuss any questions you have with your health care provider. Document Revised: 05/30/2021 Document Reviewed: 05/30/2021 Elsevier Patient Education  2023 Elsevier Inc.  

## 2022-04-03 NOTE — Progress Notes (Unsigned)
   Subjective:    Patient ID: LLANA DESHAZO, female    DOB: Oct 25, 1970, 51 y.o.   MRN: [id number]  HPI Pt arrives for follow up Over all she is doing well she is taking medicine on a regular basis her diet is doing well she still smokes and knows she needs to quit in addition to this had recent surgery for abnormal biopsy precancerous cervical cancer she has had a previous hysterectomy and gynecology did surgery she is recovering gradually from this  Review of Systems     Objective:   Physical Exam General-in no acute distress Eyes-no discharge Lungs-respiratory rate normal, CTA CV-no murmurs,RRR Extremities skin warm dry no edema Neuro grossly normal Behavior normal, alert       Assessment & Plan:  Aortic atherosclerosis-patient encouraged to quit smoking Continue cholesterol medicine Check lab work  Hyperlipidemia check lab work  Follow-up 6 months  Patient had CN III treated recently by surgery follows with gynecology

## 2022-04-04 ENCOUNTER — Encounter: Payer: Self-pay | Admitting: Obstetrics & Gynecology

## 2022-04-04 LAB — HEPATIC FUNCTION PANEL
ALT: 10 IU/L (ref 0–32)
AST: 16 IU/L (ref 0–40)
Albumin: 4.6 g/dL (ref 3.8–4.9)
Alkaline Phosphatase: 80 IU/L (ref 44–121)
Bilirubin Total: 0.2 mg/dL (ref 0.0–1.2)
Bilirubin, Direct: <0.1 mg/dL (ref 0.00–0.40)
Total Protein: 6.9 g/dL (ref 6.0–8.5)

## 2022-04-04 LAB — LIPID PANEL
Chol/HDL Ratio: 2.1 ratio (ref 0.0–4.4)
Cholesterol, Total: 120 mg/dL (ref 100–199)
HDL: 58 mg/dL (ref >39)
LDL Chol Calc (NIH): 49 mg/dL (ref 0–99)
Triglycerides: 61 mg/dL (ref 0–149)
VLDL Cholesterol Cal: 13 mg/dL (ref 5–40)

## 2022-04-07 ENCOUNTER — Encounter: Payer: Self-pay | Admitting: Obstetrics & Gynecology

## 2022-04-23 ENCOUNTER — Ambulatory Visit (INDEPENDENT_AMBULATORY_CARE_PROVIDER_SITE_OTHER): Payer: BC Managed Care – PPO | Admitting: Obstetrics & Gynecology

## 2022-04-23 ENCOUNTER — Encounter: Payer: Self-pay | Admitting: Obstetrics & Gynecology

## 2022-04-23 VITALS — BP 110/69 | HR 81 | Ht 61.0 in | Wt 134.0 lb

## 2022-04-23 DIAGNOSIS — Z9889 Other specified postprocedural states: Secondary | ICD-10-CM

## 2022-05-04 ENCOUNTER — Encounter: Payer: Self-pay | Admitting: Family Medicine

## 2022-05-08 ENCOUNTER — Ambulatory Visit: Payer: BC Managed Care – PPO | Admitting: Nurse Practitioner

## 2022-05-08 VITALS — BP 110/66 | HR 65 | Temp 98.1°F | Ht 61.0 in | Wt 134.0 lb

## 2022-05-08 DIAGNOSIS — F4310 Post-traumatic stress disorder, unspecified: Secondary | ICD-10-CM | POA: Diagnosis not present

## 2022-05-08 DIAGNOSIS — F418 Other specified anxiety disorders: Secondary | ICD-10-CM | POA: Diagnosis not present

## 2022-05-08 MED ORDER — ESCITALOPRAM OXALATE 10 MG PO TABS: 10.0000 mg | ORAL_TABLET | Freq: Every day | ORAL | 0 refills | Status: DC
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
## 2022-05-09 ENCOUNTER — Encounter: Payer: Self-pay | Admitting: Nurse Practitioner

## 2022-05-12 ENCOUNTER — Other Ambulatory Visit: Payer: Self-pay

## 2022-05-12 MED ORDER — ESCITALOPRAM OXALATE 10 MG PO TABS: 10.0000 mg | ORAL_TABLET | Freq: Every day | ORAL | 0 refills | Status: DC

## 2022-05-13 ENCOUNTER — Telehealth: Payer: Self-pay | Admitting: Nurse Practitioner

## 2022-05-13 NOTE — Telephone Encounter (Signed)
[employer] sent over another disability form to be completed for patient on your desk. Peggy Franklin

## 2022-05-20 NOTE — Telephone Encounter (Signed)
This was completed and given to front desk staff on 05/12/22

## 2022-06-03 ENCOUNTER — Other Ambulatory Visit: Payer: Self-pay

## 2022-06-03 MED ORDER — PANTOPRAZOLE SODIUM 40 MG PO TBEC: DELAYED_RELEASE_TABLET | ORAL | 0 refills | Status: DC

## 2022-06-05 ENCOUNTER — Ambulatory Visit: Payer: BC Managed Care – PPO | Admitting: Nurse Practitioner

## 2022-07-06 ENCOUNTER — Encounter: Payer: Self-pay | Admitting: Family Medicine

## 2022-07-06 ENCOUNTER — Ambulatory Visit
Admission: EM | Admit: 2022-07-06 | Discharge: 2022-07-06 | Disposition: A | Discharge status: Home / Self Care | Payer: Self-pay | Attending: Nurse Practitioner | Admitting: Nurse Practitioner

## 2022-07-06 DIAGNOSIS — L03316 Cellulitis of umbilicus: Secondary | ICD-10-CM

## 2022-07-06 MED ORDER — MUPIROCIN 2 % EX OINT: 1.0000 | TOPICAL_OINTMENT | Freq: Two times a day (BID) | CUTANEOUS | 0 refills | Status: DC

## 2022-07-06 NOTE — ED Triage Notes (Signed)
Pt reports belly button pain x 2 days; drainage from belly button started today.

## 2022-07-06 NOTE — ED Provider Notes (Signed)
[facility]    CSN: [id number] Arrival date & time: 07/06/22  1645      History   Chief Complaint Chief Complaint  Patient presents with   Drainage    HPI Peggy Franklin is a 52 y.o. female.   Patient presents today for a few days of bellybutton pain.  Reports last night, she started noticing bloody drainage from and around her bellybutton.  No fevers or nausea/vomiting.  No recent piercings.  Has not tried anything for symptoms so far.  Reports medical history significant for multiple abdominal surgeries including abdominal hysterectomy, small intestine and large intestine surgeries, duodenum surgery.  She is concerned it may be a hernia.    Past Medical History:  Diagnosis Date   Cancer ([place])    Diverticulitis    GERD (gastroesophageal reflux disease)    History of asplenia 07/10/2019   Hyperlipidemia 04/03/2022    Patient Active Problem List   Diagnosis Date Noted   Depression with anxiety 05/08/2022   PTSD (post-traumatic stress disorder) 05/08/2022   Hyperlipidemia 04/03/2022   VIN III (vulvar intraepithelial neoplasia III)    Sebaceous cyst    Lichen planus 05/39/7673   Vesicle of skin 01/21/2022   Aortic atherosclerosis ([place]) 01/01/2022   Smoker 01/01/2022   Sore throat 09/24/2020   Non-recurrent acute suppurative otitis media of right ear without spontaneous rupture of tympanic membrane 09/24/2020   Seasonal allergic rhinitis due to pollen 09/24/2020   COVID-19 virus infection 07/12/2020   Close exposure to 2019 novel coronavirus 07/10/2020   Diarrhea 05/30/2020   Cough 02/05/2020   Head congestion 02/05/2020   History of asplenia 07/10/2019   Genital warts 04/08/2018   Anxiety 04/08/2018   Anal skin tag 03/28/2015   Gastroesophageal reflux disease without esophagitis 03/07/2015   Postsurgical dumping syndrome 01/20/2013    Past Surgical History:  Procedure Laterality Date   ABDOMINAL HYSTERECTOMY     COLON SURGERY     EXCISION  VAGINAL CYST Left 03/11/2022   Procedure: EXCISION SEBACEOUS CYST;  Surgeon: Florian Buff, MD;  Location: [facility];  Service: Gynecology;  Laterality: Left;   LAPAROSCOPIC BILATERAL SALPINGO OOPHERECTOMY Bilateral    REMOVAL OF GASTROINTESTINAL STOMATIC  TUMOR OF STOMACH     SMALL INTESTINE SURGERY     VULVAR LESION REMOVAL N/A 03/11/2022   Procedure: VULVAR BIOPSY WITH LASER ABLATION OF VULVAR;  Surgeon: Florian Buff, MD;  Location: [facility];  Service: Gynecology;  Laterality: N/A;  pt knows to arrive at 9:00    OB History     Gravida  3   Para  3   Term  2   Preterm  1   AB      Living  2      SAB      IAB      Ectopic      Multiple      Live Births  1            Home Medications    Prior to Admission medications   Medication Sig Start Date End Date Taking? Authorizing Provider  mupirocin ointment (BACTROBAN) 2 % Apply 1 Application topically 2 (two) times daily. Apply to clean skin twice daily 07/06/22  Yes Noemi Chapel A, NP  escitalopram (LEXAPRO) 10 MG tablet Take 1 tablet (10 mg total) by mouth daily. 05/12/22   Kathyrn Drown, MD  lidocaine (XYLOCAINE) 5 % ointment Apply 1 Application topically as needed. 02/04/22   Florian Buff,  MD  pantoprazole (PROTONIX) 40 MG tablet TAKE 1 TABLET(40 MG) BY MOUTH DAILY 06/03/22   Kathyrn Drown, MD  rosuvastatin (CRESTOR) 5 MG tablet Take 1 tablet (5 mg total) by mouth daily. 04/03/22   Kathyrn Drown, MD  silver sulfADIAZINE (SILVADENE) 1 % cream Use to area 3-4 times daily 03/11/22   Florian Buff, MD    Family History Family History  Problem Relation Age of Onset   Emphysema Paternal Grandfather    Stroke Paternal Grandmother    Cancer Maternal Grandmother        renal and uterine cancer   Heart attack Maternal Grandmother    Other Maternal Grandfather        accident   Cancer Mother 104       pancreatic cancer   Other Sister        house fire   Heart attack Sister    Heart attack Daughter      Social History Social History   Tobacco Use   Smoking status: Every Day    Packs/day: 0.50    Types: Cigarettes    Start date: 04/06/2013   Smokeless tobacco: Never   Tobacco comments:    Smokes E cig  Vaping Use   Vaping Use: Never used  Substance Use Topics   Alcohol use: No   Drug use: No     Allergies   Other   Review of Systems Review of Systems Per HPI  Physical Exam Triage Vital Signs ED Triage Vitals  Enc Vitals Group     BP 07/06/22 1721 115/74     Pulse Rate 07/06/22 1721 79     Resp 07/06/22 1721 18     Temp 07/06/22 1721 98.1 F (36.7 C)     Temp Source 07/06/22 1721 Oral     SpO2 07/06/22 1721 96 %     Weight --      Height --      Head Circumference --      Peak Flow --      Pain Score 07/06/22 1720 7     Pain Loc --      Pain Edu? --      Excl. in [place]? --    No data found.  Updated Vital Signs BP 115/74 (BP Location: Right Arm)   Pulse 79   Temp 98.1 F (36.7 C) (Oral)   Resp 18   SpO2 96%   Visual Acuity Right Eye Distance:   Left Eye Distance:   Bilateral Distance:    Right Eye Near:   Left Eye Near:    Bilateral Near:     Physical Exam Vitals and nursing note reviewed.  Constitutional:      General: She is not in acute distress.    Appearance: Normal appearance. She is not toxic-appearing.  HENT:     Mouth/Throat:     Mouth: Mucous membranes are moist.     Pharynx: Oropharynx is clear.  Pulmonary:     Effort: Pulmonary effort is normal. No respiratory distress.  Abdominal:     General: Abdomen is flat. Bowel sounds are normal. There is no distension.     Palpations: Abdomen is soft.  Skin:    General: Skin is warm and dry.     Capillary Refill: Capillary refill takes less than 2 seconds.     Comments: Brown colored drainage noted around umbilicus, nonodorous.  Umbilicus tender to palpation.  No fluctuance.   Neurological:  Mental Status: She is alert and oriented to person, place, and time.  Psychiatric:         Behavior: Behavior is cooperative.      UC Treatments / Results  Labs (all labs ordered are listed, but only abnormal results are displayed) Labs Reviewed - No data to display  EKG   Radiology No results found.  Procedures Procedures (including critical care time)  Medications Ordered in UC Medications - No data to display  Initial Impression / Assessment and Plan / UC Course  I have reviewed the triage vital signs and the nursing notes.  Pertinent labs & imaging results that were available during my care of the patient were reviewed by me and considered in my medical decision making (see chart for details).   Patient is well-appearing, normotensive, afebrile, not tachycardic, not tachypneic, oxygenating well on room air.    Cellulitis, umbilical Treat with topical mupirocin ointment twice daily; discussed skin cleansing ER and return precautions discussed Follow up with PCP with no improvement or worsening of symptoms despite treatment  The patient was given the opportunity to ask questions.  All questions answered to their satisfaction.  The patient is in agreement to this plan.    Final Clinical Impressions(s) / UC Diagnoses   Final diagnoses:  Cellulitis, umbilical     Discharge Instructions      Please start cleaning your belly button twice daily with a mild soap and water.  After, apply a layer of the mupirocin ointment twice daily.  Follow up with PCP with no improvement or worsening of symptoms despite treatment.     ED Prescriptions     Medication Sig Dispense Auth. Provider   mupirocin ointment (BACTROBAN) 2 % Apply 1 Application topically 2 (two) times daily. Apply to clean skin twice daily 22 g Eulogio Bear, NP      PDMP not reviewed this encounter.   Eulogio Bear, NP 07/06/22 1739

## 2022-07-06 NOTE — Discharge Instructions (Signed)
Please start cleaning your belly button twice daily with a mild soap and water.  After, apply a layer of the mupirocin ointment twice daily.  Follow up with PCP with no improvement or worsening of symptoms despite treatment.

## 2022-08-20 ENCOUNTER — Encounter: Payer: Self-pay | Admitting: Radiology

## 2022-08-25 ENCOUNTER — Other Ambulatory Visit: Payer: Self-pay | Admitting: Family Medicine

## 2022-08-25 DIAGNOSIS — Z1231 Encounter for screening mammogram for malignant neoplasm of breast: Secondary | ICD-10-CM

## 2022-10-05 ENCOUNTER — Ambulatory Visit (INDEPENDENT_AMBULATORY_CARE_PROVIDER_SITE_OTHER): Payer: Medicaid Other | Admitting: Family Medicine

## 2022-10-05 VITALS — BP 112/75 | HR 71 | Ht 61.0 in | Wt 127.2 lb

## 2022-10-05 DIAGNOSIS — K219 Gastro-esophageal reflux disease without esophagitis: Secondary | ICD-10-CM

## 2022-10-05 DIAGNOSIS — Z1211 Encounter for screening for malignant neoplasm of colon: Secondary | ICD-10-CM

## 2022-10-05 DIAGNOSIS — E785 Hyperlipidemia, unspecified: Secondary | ICD-10-CM | POA: Diagnosis not present

## 2022-10-05 DIAGNOSIS — F17200 Nicotine dependence, unspecified, uncomplicated: Secondary | ICD-10-CM

## 2022-10-05 MED ORDER — PANTOPRAZOLE SODIUM 40 MG PO TBEC: DELAYED_RELEASE_TABLET | ORAL | 1 refills | Status: DC

## 2022-10-05 MED ORDER — ROSUVASTATIN CALCIUM 5 MG PO TABS: 5.0000 mg | ORAL_TABLET | Freq: Every day | ORAL | 1 refills | Status: DC

## 2022-10-05 MED ORDER — DICLOFENAC SODIUM 75 MG PO TBEC: 75.0000 mg | DELAYED_RELEASE_TABLET | Freq: Two times a day (BID) | ORAL | 0 refills | Status: DC

## 2022-10-05 NOTE — Progress Notes (Signed)
   Subjective:    Patient ID: Peggy [place], female    DOB: 03/08/1971, 52 y.o.   MRN: [id number]  HPI Patient arrives today for 6 month follow up and medication refills.  Patient does have ongoing trouble with reflux.  Takes medication on a regular basis.  Tries to minimize foods as best they can.  They understand the importance of dietary compliance.  May also try to avoid eating a large meal close to bedtime.  Patient denies any dysphagia denies hematochezia.  States medicine does a good job keeping the problem under good control.  Without the medication may certainly have issues.They desire to continue taking their medication.  Patient here for follow-up regarding cholesterol.    Patient relates taking medication on a regular basis Denies problems with medication Importance of dietary measures discussed Regular lab work regarding lipid and liver was checked and if needing additional labs was appropriately ordered  Patient does use tobacco products.  Patient knows they should quit.  Patient is aware that smoking/in use of tobacco products increases their risk of heart disease, cancer, and COPD- lung issues.  Patient has been counseled to quit smoking/tobacco products.  3 to 10 minutes was spent with patient discussing tobacco as well as quitting strategies  Patient is having issues with right elbow.   Review of Systems     Objective:   Physical Exam General-in no acute distress Eyes-no discharge Lungs-respiratory rate normal, CTA CV-no murmurs,RRR Extremities skin warm dry no edema Neuro grossly normal Behavior normal, alert        Assessment & Plan:  Patient under a lot of stress her daughter has some pretty serious mental health issues we discussed ways to try to help with this Her daughter will reach out to [place] for referral  The patient was seen today for GERD. Patient benefits from medication. Patient to continue medication. Keep all regular follow ups. Under good  control with medicine  Patient was counseled to quit smoking  Colonoscopy recommended  Hyperlipidemia-importance of diet, weight control, activity, compliance with medications discussed.   Recent labs reviewed.   Any additional labs or refills ordered.   Importance of keeping under good control discussed. Regular follow-up visits discussed

## 2022-10-09 ENCOUNTER — Other Ambulatory Visit: Payer: Self-pay | Admitting: Family Medicine

## 2022-10-11 NOTE — Progress Notes (Signed)
  HPI: Patient returns for routine postoperative follow-up having undergone laser management of VINIII, symtpomatic, removal of sebaceous cyst on 03/11/22.  The patient's immediate postoperative recovery has been unremarkable. Since hospital discharge the patient reports 03/11/22.   Current Outpatient Medications: benzonatate (TESSALON) 100 MG capsule, Take 1 capsule (100 mg total) by mouth 2 (two) times daily as needed for cough., Disp: 20 capsule, Rfl: 0 clobetasol ointment (TEMOVATE) 0.05 %, Apply 1 Application topically 2 (two) times daily., Disp: 30 g, Rfl: 1 HYDROcodone-acetaminophen (NORCO/VICODIN) 5-325 MG tablet, Take 1 tablet by mouth every 6 (six) hours as needed., Disp: 15 tablet, Rfl: 0 ketorolac (TORADOL) 10 MG tablet, Take 1 tablet (10 mg total) by mouth every 8 (eight) hours as needed., Disp: 15 tablet, Rfl: 0 lidocaine (XYLOCAINE) 5 % ointment, Apply 1 Application topically as needed., Disp: 35.44 g, Rfl: 0 ondansetron (ZOFRAN-ODT) 8 MG disintegrating tablet, Take 1 tablet (8 mg total) by mouth every 8 (eight) hours as needed for nausea or vomiting., Disp: 8 tablet, Rfl: 0 pantoprazole (PROTONIX) 40 MG tablet, TAKE 1 TABLET(40 MG) BY MOUTH DAILY, Disp: 90 tablet, Rfl: 1 rosuvastatin (CRESTOR) 5 MG tablet, Take 1 tablet (5 mg total) by mouth daily., Disp: 90 tablet, Rfl: 1 silver sulfADIAZINE (SILVADENE) 1 % cream, Use to area 3-4 times daily, Disp: 50 g, Rfl: 11 valACYclovir (VALTREX) 1000 MG tablet, Take 1 tablet (1,000 mg total) by mouth 2 (two) times daily., Disp: 20 tablet, Rfl: 1  No current facility-administered medications for this visit.    Blood pressure 117/77, pulse 93.  Physical Exam: Left vulva is healing normally post laser, s/p sloughing necrotic tissue phase, granulating well, no infection is noted, symmetrical healing from the edges Sebaceous cyst site is healing fine, sutures are removed GV placed   Diagnostic Tests:   Pathology: Benign  cyst VINIII  Impression + Management plan:    ICD-10-CM   1. Post-operative state: laser management ablation of symptomatic VINIII  Z98.890          Medications Prescribed this encounter: No orders of the defined types were placed in this encounter.     Follow up: No follow-ups on file.

## 2022-10-13 ENCOUNTER — Encounter: Payer: Self-pay | Admitting: *Deleted

## 2022-10-26 ENCOUNTER — Ambulatory Visit (INDEPENDENT_AMBULATORY_CARE_PROVIDER_SITE_OTHER): Payer: Medicaid Other | Admitting: Obstetrics & Gynecology

## 2022-10-26 ENCOUNTER — Encounter: Payer: Self-pay | Admitting: Obstetrics & Gynecology

## 2022-10-26 VITALS — BP 103/64 | HR 77 | Ht 61.0 in | Wt 126.0 lb

## 2022-10-26 DIAGNOSIS — D071 Carcinoma in situ of vulva: Secondary | ICD-10-CM

## 2022-10-26 DIAGNOSIS — N905 Atrophy of vulva: Secondary | ICD-10-CM

## 2022-10-26 MED ORDER — ESTRADIOL 0.1 MG/GM VA CREA: TOPICAL_CREAM | VAGINAL | 12 refills | Status: DC

## 2022-10-26 NOTE — Progress Notes (Signed)
Follow up appointment for ongoing surveillance: S/P laser ablation of the vulva for VINIII  Chief Complaint  Patient presents with   Follow-up    Blood pressure 103/64, pulse 77, height 5\' 1"  (1.549 m), weight 126 lb (57.2 kg).  Surveillance of the vulva reveals no evidence of residual VINIII  She does have some sensitivity on the left where the disease was, she is very dry so we will use estrace cream to see if that helps  MEDS ordered this encounter: Meds ordered this encounter  Medications   estradiol (ESTRACE) 0.1 MG/GM vaginal cream    Sig: 1 gram at bedtime    Dispense:  30 g    Refill:  12    Orders for this encounter: No orders of the defined types were placed in this encounter.   Impression + Management Plan   ICD-10-CM   1. VIN III (vulvar intraepithelial neoplasia III), multifocal  D07.1     2. Vulvar atrophy: estrogen deprivation +/- post op surgical changes  N90.5       Follow Up: Return in about 6 months (around 04/28/2023) for Follow up, with Dr Despina Hidden.     All questions were answered.  Past Medical History:  Diagnosis Date   Cancer [facility])    Diverticulitis    GERD (gastroesophageal reflux disease)    History of asplenia 07/10/2019   Hyperlipidemia 04/03/2022    Past Surgical History:  Procedure Laterality Date   ABDOMINAL HYSTERECTOMY     COLON SURGERY     EXCISION VAGINAL CYST Left 03/11/2022   Procedure: EXCISION SEBACEOUS CYST;  Surgeon: Lazaro Arms, MD;  Location: [facility];  Service: Gynecology;  Laterality: Left;   LAPAROSCOPIC BILATERAL SALPINGO OOPHERECTOMY Bilateral    REMOVAL OF GASTROINTESTINAL STOMATIC  TUMOR OF STOMACH     SMALL INTESTINE SURGERY     VULVAR LESION REMOVAL N/A 03/11/2022   Procedure: VULVAR BIOPSY WITH LASER ABLATION OF VULVAR;  Surgeon: Lazaro Arms, MD;  Location: [facility];  Service: Gynecology;  Laterality: N/A;  pt knows to arrive at 9:00    OB History     Gravida  3   Para  3   Term  2   Preterm  1    AB      Living  2      SAB      IAB      Ectopic      Multiple      Live Births  1           Allergies  Allergen Reactions   Other Hives    Steroids     Social History   Socioeconomic History   Marital status: Media planner    Spouse name: Not on file   Number of children: Not on file   Years of education: Not on file   Highest education level: Not on file  Occupational History   Not on file  Tobacco Use   Smoking status: Every Day    Packs/day: .5    Types: Cigarettes    Start date: 04/06/2013   Smokeless tobacco: Never   Tobacco comments:    Smokes E cig  Vaping Use   Vaping Use: Never used  Substance and Sexual Activity   Alcohol use: No   Drug use: No   Sexual activity: Not Currently    Birth control/protection: Surgical    Comment: hyst  Other Topics Concern   Not on file  Social History Narrative  Not on file   Social Determinants of Health   Financial Resource Strain: Not on file  Food Insecurity: Not on file  Transportation Needs: Not on file  Physical Activity: Not on file  Stress: Not on file  Social Connections: Not on file    Family History  Problem Relation Age of Onset   Emphysema Paternal Grandfather    Stroke Paternal Grandmother    Cancer Maternal Grandmother        renal and uterine cancer   Heart attack Maternal Grandmother    Other Maternal Grandfather        accident   Cancer Mother 44       pancreatic cancer   Other Sister        house fire   Heart attack Sister    Heart attack Daughter

## 2022-11-09 ENCOUNTER — Other Ambulatory Visit: Payer: Self-pay | Admitting: Family Medicine

## 2022-11-09 ENCOUNTER — Telehealth: Payer: Self-pay

## 2022-11-09 MED ORDER — PANTOPRAZOLE SODIUM 40 MG PO TBEC: DELAYED_RELEASE_TABLET | ORAL | 1 refills | Status: DC

## 2022-11-09 NOTE — Telephone Encounter (Signed)
Prescription sent as requested.

## 2022-11-09 NOTE — Telephone Encounter (Signed)
Prescription Request  11/09/2022  LOV: Visit date not found  What is the name of the medication or equipment? pantoprazole (PROTONIX) 40 MG tablet   Have you contacted your pharmacy to request a refill?  Yes  Which pharmacy would you like this sent to?  [id number] - [place],  - [address] [place] [address] Phone: [phone] Fax: [phone]    Patient notified that their request is being sent to the clinical staff for review and that they should receive a response within 2 business days.   Please advise at Mobile [phone] (mobile)

## 2022-11-17 ENCOUNTER — Ambulatory Visit: Payer: Medicaid Other | Admitting: Family Medicine

## 2022-11-17 ENCOUNTER — Encounter: Payer: Self-pay | Admitting: Family Medicine

## 2022-11-17 VITALS — BP 102/64 | HR 100 | Ht 61.0 in | Wt 126.6 lb

## 2022-11-17 DIAGNOSIS — R42 Dizziness and giddiness: Secondary | ICD-10-CM | POA: Diagnosis not present

## 2022-11-17 DIAGNOSIS — E785 Hyperlipidemia, unspecified: Secondary | ICD-10-CM | POA: Diagnosis not present

## 2022-11-17 DIAGNOSIS — I951 Orthostatic hypotension: Secondary | ICD-10-CM

## 2022-11-17 DIAGNOSIS — R55 Syncope and collapse: Secondary | ICD-10-CM

## 2022-11-17 NOTE — Progress Notes (Signed)
   Subjective:    Patient ID: Peggy [place], female    DOB: 1971-06-19, 52 y.o.   MRN: [id number]  HPI Patient arrives today with dizzy spells. Patient states she is not currently having dizzy spells. Spells started last Tuesday and ended last Saturday.  Patient states she started that new cholesterol medicine and then she ended up having dizziness not feeling well a few days later she states she has been under a lot of emotional stress her daughter did not do a good job of keeping up with the car payments in the car got repossessed the car was partly in the patient's name and this has ruined her credit score which is made a very difficult situation Patient states she is trying to deal with this does not feel she needs to be on any type of medicine Patient states there was a change in the pill form of Crestor. Patient not sure if this was cause.     Review of Systems     Objective:   Physical Exam  General-in no acute distress Eyes-no discharge Lungs-respiratory rate normal, CTA CV-no murmurs,RRR Extremities skin warm dry no edema Neuro grossly normal Behavior normal, alert No ataxia or dizziness spells currently      Assessment & Plan:  1. Dizziness This seems to have resolved patient will watch it she will try the cholesterol medicine again-if she has the same issue obviously we will change medicine she will let us know  2. Orthostasis There is some mild orthostasis she states because she has been so stressed she is not eating and drinking we have encouraged her to try to pick up the amount of food and liquids she is taking in to help her she needs to let us know if any setbacks or problems - CBC with Differential - Comprehensive metabolic panel  3. Syncope, unspecified syncope type Passing out occurred when she was having the severe dizziness along with not eating and drinking it was just briefly she states this is gone away await lab work.  Really doubt cardiac origin I  believe it is more dehydration and orthostasis  4. Hyperlipidemia, unspecified hyperlipidemia type Check lipid profile Resume rosuvastatin If side effects or rosuvastatin let us know Follow-up within 6 months - Lipid panel

## 2022-11-18 LAB — CBC WITH DIFFERENTIAL/PLATELET
Basophils Absolute: 0 10*3/uL (ref 0.0–0.2)
Basos: 0 %
EOS (ABSOLUTE): 0 10*3/uL (ref 0.0–0.4)
Eos: 1 %
Hematocrit: 40.2 % (ref 34.0–46.6)
Hemoglobin: 13.3 g/dL (ref 11.1–15.9)
Immature Grans (Abs): 0 10*3/uL (ref 0.0–0.1)
Immature Granulocytes: 0 %
Lymphocytes Absolute: 1.8 10*3/uL (ref 0.7–3.1)
Lymphs: 28 %
MCH: 30.4 pg (ref 26.6–33.0)
MCHC: 33.1 g/dL (ref 31.5–35.7)
MCV: 92 fL (ref 79–97)
Monocytes Absolute: 0.4 10*3/uL (ref 0.1–0.9)
Monocytes: 6 %
Neutrophils Absolute: 4.1 10*3/uL (ref 1.4–7.0)
Neutrophils: 65 %
Platelets: 195 10*3/uL (ref 150–450)
RBC: 4.38 x10E6/uL (ref 3.77–5.28)
RDW: 12.6 % (ref 11.7–15.4)
WBC: 6.3 10*3/uL (ref 3.4–10.8)

## 2022-11-18 LAB — LIPID PANEL
Chol/HDL Ratio: 2.9 ratio (ref 0.0–4.4)
Cholesterol, Total: 139 mg/dL (ref 100–199)
HDL: 48 mg/dL (ref >39)
LDL Chol Calc (NIH): 70 mg/dL (ref 0–99)
Triglycerides: 120 mg/dL (ref 0–149)
VLDL Cholesterol Cal: 21 mg/dL (ref 5–40)

## 2022-11-18 LAB — COMPREHENSIVE METABOLIC PANEL
ALT: 10 IU/L (ref 0–32)
AST: 18 IU/L (ref 0–40)
Albumin/Globulin Ratio: 2.3 — ABNORMAL HIGH (ref 1.2–2.2)
Albumin: 4.5 g/dL (ref 3.8–4.9)
Alkaline Phosphatase: 62 IU/L (ref 44–121)
BUN/Creatinine Ratio: 17 (ref 9–23)
BUN: 14 mg/dL (ref 6–24)
Bilirubin Total: 0.3 mg/dL (ref 0.0–1.2)
CO2: 26 mmol/L (ref 20–29)
Calcium: 9.6 mg/dL (ref 8.7–10.2)
Chloride: 102 mmol/L (ref 96–106)
Creatinine, Ser: 0.83 mg/dL (ref 0.57–1.00)
Globulin, Total: 2 g/dL (ref 1.5–4.5)
Glucose: 90 mg/dL (ref 70–99)
Potassium: 4 mmol/L (ref 3.5–5.2)
Sodium: 142 mmol/L (ref 134–144)
Total Protein: 6.5 g/dL (ref 6.0–8.5)
eGFR: 85 mL/min/{1.73_m2} (ref >59)

## 2022-12-14 ENCOUNTER — Encounter: Payer: Self-pay | Admitting: Family Medicine

## 2022-12-14 ENCOUNTER — Ambulatory Visit: Payer: Medicaid Other | Admitting: Nurse Practitioner

## 2022-12-14 VITALS — BP 100/60 | HR 90 | Wt 124.0 lb

## 2022-12-14 DIAGNOSIS — S31105A Unspecified open wound of abdominal wall, periumbilic region without penetration into peritoneal cavity, initial encounter: Secondary | ICD-10-CM

## 2022-12-14 NOTE — Progress Notes (Unsigned)
   Subjective:    Patient ID: Peggy [place], female    DOB: 05-04-1971, 52 y.o.   MRN: [id number]  HPI Patient arrives today with belly button drainage. Patient states the drainage is green with red tinge.  First noticed this in January 2024.  Has occurred off and on.  Noticed some greenish discoloration and slight odor yesterday, this has improved today.  When this occurs she has tenderness in the mid abdominal area which gradually subsides.  No fever.  No other abdominal pain.  Review of Systems  Constitutional:  Negative for fever.  Respiratory:  Negative for choking, chest tightness and shortness of breath.   Cardiovascular:  Negative for chest pain.  Gastrointestinal:  Positive for abdominal pain.  Skin:  Positive for wound.       Objective:   Physical Exam NAD.  Alert, oriented.  Lungs clear.  Heart regular rate rhythm.  Abdomen soft nondistended nonerythematous.  Mild tenderness noted around the umbilical area.  No erythema noted.  A cotton swab sample was obtained for culture deep into the umbilical area.  No blood was noted.  Minimal discoloration.  No odor. Today's Vitals   12/14/22 1518  BP: 100/60  Pulse: 90  SpO2: 96%  Weight: 124 lb (56.2 kg)   Body mass index is 23.43 kg/m.        Assessment & Plan:  Open wound of umbilical region, initial encounter - Plan: WOUND CULTURE Further follow-up based on wound culture results.  Patient to call back in the meantime if any signs of worsening infection.

## 2022-12-15 ENCOUNTER — Encounter: Payer: Self-pay | Admitting: Nurse Practitioner

## 2022-12-18 ENCOUNTER — Other Ambulatory Visit: Payer: Self-pay | Admitting: Nurse Practitioner

## 2022-12-18 MED ORDER — AMOXICILLIN-POT CLAVULANATE 875-125 MG PO TABS: 1.0000 | ORAL_TABLET | Freq: Two times a day (BID) | ORAL | 0 refills | Status: DC

## 2022-12-19 LAB — WOUND CULTURE

## 2022-12-27 ENCOUNTER — Encounter: Payer: Self-pay | Admitting: Emergency Medicine

## 2022-12-27 ENCOUNTER — Ambulatory Visit (INDEPENDENT_AMBULATORY_CARE_PROVIDER_SITE_OTHER): Payer: Medicaid Other

## 2022-12-27 ENCOUNTER — Ambulatory Visit
Admission: EM | Admit: 2022-12-27 | Discharge: 2022-12-27 | Disposition: A | Discharge status: Home / Self Care | Payer: Medicaid Other | Attending: Nurse Practitioner | Admitting: Nurse Practitioner

## 2022-12-27 DIAGNOSIS — M79621 Pain in right upper arm: Secondary | ICD-10-CM

## 2022-12-27 MED ORDER — METHOCARBAMOL 500 MG PO TABS: 500.0000 mg | ORAL_TABLET | Freq: Two times a day (BID) | ORAL | 0 refills | Status: DC

## 2022-12-27 NOTE — ED Triage Notes (Signed)
Right arm pain that started yesterday.  States pain feels like a cramp that won't release.

## 2022-12-27 NOTE — Discharge Instructions (Addendum)
The x-ray of your right arm is negative for fracture or dislocation. Take medication as prescribed.  I have prescribed a muscle relaxer for your right arm pain and stiffness. Recommend the use of heat and massage to help with the right upper arm pain. Try to move the arm is much as you can to prevent worsening joint immobility. If symptoms or not improving over the next 24 hours, would like for you to follow-up with orthopedics for further evaluation.  I have provided information for you for [facility] and for [facility] [place]. Follow-up as needed.

## 2022-12-27 NOTE — ED Provider Notes (Signed)
[facility]    CSN: [id number] Arrival date & time: 12/27/22  4098      History   Chief Complaint No chief complaint on file.   HPI Peggy [place] is a 52 y.o. female.   The history is provided by the patient.   The patient presents for complaints of pain in the right upper arm that started 1 day ago.  Patient states before symptoms started, she was out playing with her grandson tossing the ball in the pool.  She states that she was throwing the ball from an overhead position.  She states shortly thereafter, she noticed pain in the right arm.  Patient states she feels like it is a "spasm" that will not release".  Patient states that pain increases when she is trying to "wipe" something, when she tries to extend the arm outward, and when she tries to raise her arm above shoulder height.  Patient states that when she does move the arm, she feels a "pull" in the front portion of the arm.  She denies numbness, tingling, hand weakness, neck pain, shoulder pain, or swelling.  Patient reports she is right-hand dominant.  Patient reports prior surgery to the right forearm after a crush injury several years ago.  Patient states that she tried IcyHot, and liniment with minimal relief.  Patient also states that she took a pill for "spasms".  Patient believes it was cyclobenzaprine.  Medication did not help her symptoms.  Past Medical History:  Diagnosis Date   Cancer (HCC)    Diverticulitis    GERD (gastroesophageal reflux disease)    History of asplenia 07/10/2019   Hyperlipidemia 04/03/2022    Patient Active Problem List   Diagnosis Date Noted   Depression with anxiety 05/08/2022   PTSD (post-traumatic stress disorder) 05/08/2022   Hyperlipidemia 04/03/2022   VIN III (vulvar intraepithelial neoplasia III)    Sebaceous cyst    Lichen planus 01/21/2022   Vesicle of skin 01/21/2022   Aortic atherosclerosis (HCC) 01/01/2022   Smoker 01/01/2022   Sore throat 09/24/2020    Non-recurrent acute suppurative otitis media of right ear without spontaneous rupture of tympanic membrane 09/24/2020   Seasonal allergic rhinitis due to pollen 09/24/2020   COVID-19 virus infection 07/12/2020   Close exposure to 2019 novel coronavirus 07/10/2020   Diarrhea 05/30/2020   Cough 02/05/2020   Head congestion 02/05/2020   History of asplenia 07/10/2019   Genital warts 04/08/2018   Anxiety 04/08/2018   Anal skin tag 03/28/2015   Gastroesophageal reflux disease without esophagitis 03/07/2015   Postsurgical dumping syndrome 01/20/2013    Past Surgical History:  Procedure Laterality Date   ABDOMINAL HYSTERECTOMY     COLON SURGERY     EXCISION VAGINAL CYST Left 03/11/2022   Procedure: EXCISION SEBACEOUS CYST;  Surgeon: Lazaro Arms, MD;  Location: [facility];  Service: Gynecology;  Laterality: Left;   LAPAROSCOPIC BILATERAL SALPINGO OOPHERECTOMY Bilateral    REMOVAL OF GASTROINTESTINAL STOMATIC  TUMOR OF STOMACH     SMALL INTESTINE SURGERY     VULVAR LESION REMOVAL N/A 03/11/2022   Procedure: VULVAR BIOPSY WITH LASER ABLATION OF VULVAR;  Surgeon: Lazaro Arms, MD;  Location: [facility];  Service: Gynecology;  Laterality: N/A;  pt knows to arrive at 9:00    OB History     Gravida  3   Para  3   Term  2   Preterm  1   AB      Living  2  SAB      IAB      Ectopic      Multiple      Live Births  1            Home Medications    Prior to Admission medications   Medication Sig Start Date End Date Taking? Authorizing Provider  methocarbamol (ROBAXIN) 500 MG tablet Take 1 tablet (500 mg total) by mouth 2 (two) times daily. 12/27/22  Yes Tajanae Guilbault-Warren, Sadie Haber, NP  pantoprazole (PROTONIX) 40 MG tablet TAKE 1 TABLET(40 MG) BY MOUTH DAILY 11/09/22   Babs Sciara, MD  rosuvastatin (CRESTOR) 5 MG tablet Take 1 tablet (5 mg total) by mouth daily. Patient not taking: Reported on 12/14/2022 10/05/22   Babs Sciara, MD    Family History Family History   Problem Relation Age of Onset   Emphysema Paternal Grandfather    Stroke Paternal Grandmother    Cancer Maternal Grandmother        renal and uterine cancer   Heart attack Maternal Grandmother    Other Maternal Grandfather        accident   Cancer Mother 19       pancreatic cancer   Other Sister        house fire   Heart attack Sister    Heart attack Daughter     Social History Social History   Tobacco Use   Smoking status: Every Day    Packs/day: .5    Types: Cigarettes    Start date: 04/06/2013   Smokeless tobacco: Never   Tobacco comments:    Smokes E cig  Vaping Use   Vaping Use: Never used  Substance Use Topics   Alcohol use: No   Drug use: No     Allergies   Other   Review of Systems Review of Systems Per HPI  Physical Exam Triage Vital Signs ED Triage Vitals  Enc Vitals Group     BP 12/27/22 0833 115/73     Pulse Rate 12/27/22 0833 86     Resp 12/27/22 0833 16     Temp 12/27/22 0833 98 F (36.7 C)     Temp Source 12/27/22 0833 Oral     SpO2 12/27/22 0833 99 %     Weight --      Height --      Head Circumference --      Peak Flow --      Pain Score 12/27/22 0834 9     Pain Loc --      Pain Edu? --      Excl. in GC? --    No data found.  Updated Vital Signs BP 115/73 (BP Location: Left Arm)   Pulse 86   Temp 98 F (36.7 C) (Oral)   Resp 16   SpO2 99%   Visual Acuity Right Eye Distance:   Left Eye Distance:   Bilateral Distance:    Right Eye Near:   Left Eye Near:    Bilateral Near:     Physical Exam Vitals and nursing note reviewed.  Constitutional:      General: She is not in acute distress.    Appearance: Normal appearance.  Eyes:     Extraocular Movements: Extraocular movements intact.     Pupils: Pupils are equal, round, and reactive to light.  Musculoskeletal:     Right upper arm: Tenderness (Tenderness noted to the anterior aspect of the right forearm) present. No swelling, edema, deformity or  bony tenderness.      Cervical back: Normal range of motion.  Skin:    General: Skin is warm and dry.  Neurological:     General: No focal deficit present.     Mental Status: She is alert and oriented to person, place, and time.  Psychiatric:        Mood and Affect: Mood normal.        Behavior: Behavior normal.      UC Treatments / Results  Labs (all labs ordered are listed, but only abnormal results are displayed) Labs Reviewed - No data to display  EKG   Radiology DG Humerus Right  Result Date: 12/27/2022 CLINICAL DATA:  Generalized arm pain. EXAM: RIGHT HUMERUS - 2+ VIEW COMPARISON:  None Available. FINDINGS: No fracture or bone lesion. Glenohumeral and elbow joints are normally spaced and aligned. Soft tissues are unremarkable. IMPRESSION: Negative. Electronically Signed   By: Amie Portland M.D.   On: 12/27/2022 09:32    Procedures Procedures (including critical care time)  Medications Ordered in UC Medications - No data to display  Initial Impression / Assessment and Plan / UC Course  I have reviewed the triage vital signs and the nursing notes.  Pertinent labs & imaging results that were available during my care of the patient were reviewed by me and considered in my medical decision making (see chart for details).  The patient is well-appearing, she is in no acute distress, vital signs are stable.  X-ray of the right forearm is negative for fracture or dislocation.  Do not suspect based on patient's complaints and precipitating events.  Differential diagnoses include biceps tendon rupture versus tennis elbow.  For muscle spasm and tension, methocarbamol 500 mg was prescribed.  Supportive care recommendations were provided to the patient to include the use of heat and massage, the use of over-the-counter analgesics for pain or discomfort..  Patient was also advised that if symptoms or not improving over the next 24 to 48 hours, it is recommended that she follow-up with orthopedics for further  evaluation.  Patient was given information for [facility] [place] and for [facility].  Patient is in agreement with this plan of care and verbalizes understanding.  All questions were answered.  Patient stable for discharge.  Work note was provided.  Final Clinical Impressions(s) / UC Diagnoses   Final diagnoses:  Pain in right upper arm     Discharge Instructions      The x-ray of your right arm is negative for fracture or dislocation. Take medication as prescribed.  I have prescribed a muscle relaxer for your right arm pain and stiffness. Recommend the use of heat and massage to help with the right upper arm pain. Try to move the arm is much as you can to prevent worsening joint immobility. If symptoms or not improving over the next 24 hours, would like for you to follow-up with orthopedics for further evaluation.  I have provided information for you for [facility] and for [facility] [place]. Follow-up as needed.      ED Prescriptions     Medication Sig Dispense Auth. Provider   methocarbamol (ROBAXIN) 500 MG tablet Take 1 tablet (500 mg total) by mouth 2 (two) times daily. 20 tablet Rhia Blatchford-Warren, Sadie Haber, NP      PDMP not reviewed this encounter.   Abran Cantor, NP 12/27/22 [id number]

## 2023-02-03 ENCOUNTER — Other Ambulatory Visit: Payer: Self-pay | Admitting: Nurse Practitioner

## 2023-02-03 ENCOUNTER — Encounter: Payer: Self-pay | Admitting: Nurse Practitioner

## 2023-02-03 ENCOUNTER — Ambulatory Visit (INDEPENDENT_AMBULATORY_CARE_PROVIDER_SITE_OTHER): Payer: Medicaid Other

## 2023-02-03 ENCOUNTER — Ambulatory Visit: Payer: Medicaid Other | Admitting: Podiatry

## 2023-02-03 DIAGNOSIS — L0889 Other specified local infections of the skin and subcutaneous tissue: Secondary | ICD-10-CM

## 2023-02-03 DIAGNOSIS — M79671 Pain in right foot: Secondary | ICD-10-CM

## 2023-02-03 DIAGNOSIS — M778 Other enthesopathies, not elsewhere classified: Secondary | ICD-10-CM

## 2023-02-03 DIAGNOSIS — B070 Plantar wart: Secondary | ICD-10-CM

## 2023-02-03 DIAGNOSIS — M7752 Other enthesopathy of left foot: Secondary | ICD-10-CM | POA: Diagnosis not present

## 2023-02-03 DIAGNOSIS — M7751 Other enthesopathy of right foot: Secondary | ICD-10-CM

## 2023-02-03 MED ORDER — DOXYCYCLINE HYCLATE 100 MG PO CAPS: 100.0000 mg | ORAL_CAPSULE | Freq: Two times a day (BID) | ORAL | 2 refills | Status: DC

## 2023-02-03 MED ORDER — NYSTATIN 100000 UNIT/GM EX CREA: 1.0000 | TOPICAL_CREAM | Freq: Two times a day (BID) | CUTANEOUS | 0 refills | Status: DC

## 2023-02-06 NOTE — Progress Notes (Signed)
Chief Complaint  Patient presents with   Foot Pain    Right foot has been 1 month and left about 3 weeks. No injuries. She gets a painful callus on the side of the right foot 5th metatarsal base. Bilateral x-rays taken    HPI: 52 y.o. female presenting today for evaluation of a very painful symptomatic skin lesion to the lateral aspect of the right foot.  The lesion has been present for several months.  She has tried different shoes to help alleviate the pressure from the area but it continues to be painful and tender on a daily basis.  Past Medical History:  Diagnosis Date   Cancer [facility])    Diverticulitis    GERD (gastroesophageal reflux disease)    History of asplenia 07/10/2019   Hyperlipidemia 04/03/2022    Past Surgical History:  Procedure Laterality Date   ABDOMINAL HYSTERECTOMY     COLON SURGERY     EXCISION VAGINAL CYST Left 03/11/2022   Procedure: EXCISION SEBACEOUS CYST;  Surgeon: Lazaro Arms, MD;  Location: [facility];  Service: Gynecology;  Laterality: Left;   LAPAROSCOPIC BILATERAL SALPINGO OOPHERECTOMY Bilateral    REMOVAL OF GASTROINTESTINAL STOMATIC  TUMOR OF STOMACH     SMALL INTESTINE SURGERY     VULVAR LESION REMOVAL N/A 03/11/2022   Procedure: VULVAR BIOPSY WITH LASER ABLATION OF VULVAR;  Surgeon: Lazaro Arms, MD;  Location: [facility];  Service: Gynecology;  Laterality: N/A;  pt knows to arrive at 9:00    Allergies  Allergen Reactions   Other Hives    Steroids       Physical Exam: General: The patient is alert and oriented x3 in no acute distress.  Dermatology: Skin is warm, dry and supple bilateral lower extremities.  Hard symptomatic skin lesion noted to along the lateral column of the right foot with associated tenderness.  The lesion appears very hard and nodular.  No drainage.  Please see above noted photo  Vascular: Palpable pedal pulses bilaterally. Capillary refill within normal limits.  No appreciable edema.  No erythema.  Neurological:  Grossly intact via light touch  Musculoskeletal Exam: No pedal deformities noted  Radiographic Exam RT foot 02/03/2023:  Normal osseous mineralization. Joint spaces preserved.  No fractures or osseous irregularities noted.  Assessment/Plan of Care: 1.  Symptomatic skin lesion lateral aspect of the right foot  -Patient evaluated -The skin lesion is very painful and symptomatic and has been present for several months.  Shoe gear modifications did not alleviate any of her pain associated to the lesion.  I do believe it is appropriate to resect the lesion in its entirety for excisional biopsy.  The lesion is very nodular and hard and extends into the deeper tissue. -Risk benefits advantages and disadvantages of the procedure were explained in detail the patient.  No guarantees were expressed or implied.  The procedure would include elliptical excision of the skin lesion with primary closure.  The patient agrees would like to move forward with this.  I believe this can be performed in office -Authorization for an office procedure was initiated today.  The procedure will consist of excisional biopsy right foot skin lesion -Return to clinic morning of in office procedure       Felecia Shelling, DPM [facility]  Dr. Felecia Shelling, DPM    [address]  [place], [place] [address]                Office [phone]  Fax [phone]

## 2023-02-16 ENCOUNTER — Telehealth: Payer: Self-pay | Admitting: Podiatry

## 2023-02-16 NOTE — Telephone Encounter (Signed)
Notification or Prior Authorization is not required for the requested services This [employer] Medicaid members plan does not currently require a prior authorization for these services. If you have general questions about the prior authorization requirements, please call us at [phone] or visit [email or web address] and select the state where you practice. The number above acknowledges your notification. Please write this number down for future reference. Notification or Prior Authorization does not confirm benefit coverage and is not a guarantee of coverage or payment. Decision ID #: [id number] The number above acknowledges your inquiry and our response. Please write this number down and refer to it for future inquiries.   Patient name Peggy Franklin Member number [id number] O 08/21/2022 Termination date 06/22/2023 Insurance type Medicaid  Gala Lewandowsky Tax ID number [id number] Address [address] [place], [place], [place] [address] Status In network Service details  Place of service Office   Diagnosis code details  Code pointer Primary Diagnosis code B07.0 Description Plantar wart Procedure code details  Code pointer Primary Procedure Code 11106 Description Incisional biopsy of skin (eg, wedge) (including simple closure, when performed); single lesion Selected servicing provider The provider who is providing the service being requested.  Surgical Expected from date  03/01/2023 Expected to date  03/01/2023 Count  1 Standard of Measure  Units Frequency  Time(s) Total  1

## 2023-03-01 ENCOUNTER — Other Ambulatory Visit: Payer: Self-pay | Admitting: Podiatry

## 2023-03-01 ENCOUNTER — Ambulatory Visit (INDEPENDENT_AMBULATORY_CARE_PROVIDER_SITE_OTHER): Payer: Medicaid Other | Admitting: Podiatry

## 2023-03-01 DIAGNOSIS — M7989 Other specified soft tissue disorders: Secondary | ICD-10-CM

## 2023-03-01 MED ORDER — TRAMADOL HCL 50 MG PO TABS: 50.0000 mg | ORAL_TABLET | ORAL | 0 refills | Status: AC | PRN

## 2023-03-01 NOTE — Addendum Note (Signed)
Addended by: Felecia Shelling on: 03/01/2023 08:20 AM   Modules accepted: Orders

## 2023-03-01 NOTE — Progress Notes (Signed)
   OPERATIVE REPORT Patient name: Peggy Franklin MRN: [id number] DOB: Feb 03, 1971  DOS:  03/01/23  Preop Dx: Symptomatic benign skin lesion RT foot/eccrine poroma RT foot Postop Dx: same  Procedure:  1.  Excision of benign skin lesion RT foot  Surgeon: Felecia Shelling DPM  Anesthesia: 2% lidocaine plain totaling 3 mL infiltrated around the surgical area  Hemostasis: None  EBL: Minimal mL Materials: None Injectables: None Pathology: Benign skin lesion  Condition: The patient tolerated the procedure and anesthesia well. No complications noted or reported   Justification for procedure: The patient is a 52 y.o. female who presents today for correction of an indurated hard nodular symptomatic skin lesion to the plantar lateral aspect of the right foot. Conservative modalities of been unsuccessful in providing any sort of satisfactory alleviation of symptoms with the patient. The patient was told benefits as well as possible side effects of the surgery. The patient consented for surgical correction. The patient consent form was reviewed. All patient questions were answered. No guarantees were expressed or implied.   Procedure in Detail: The patient was brought to the procedure room, placed in the procedure chair in the supine position at which time an aseptic scrub and drape were performed about the patient's respective lower extremity after anesthesia was induced as described above. Attention was then directed to the surgical area where procedure number one commenced.  Procedure #1: Excisional biopsy of benign skin lesion RT foot A 2 cm elliptical skin wedge was planned and made around the symptomatic skin lesion using a #15 scalpel.  Extended into the level subcutaneous tissue and the ellipse skin wedge was removed including the entirety of the symptomatic lesion.  Irrigation was utilized and primary closure was achieved using 4-0 nylon suture  Dry sterile compressive dressings were then  applied about the patient's lower extremity. The patient was then discharged from the office with adequate prescriptions for analgesia. Verbal as well as written instructions were provided for the patient regarding postprocedural care. The patient is to keep the dressings clean dry and intact until they are to follow up in the office upon discharge in one week.   Felecia Shelling, DPM [facility]  Dr. Felecia Shelling, DPM    [address] [place], [place] [address]                Office [phone]  Fax [phone]

## 2023-03-01 NOTE — Addendum Note (Signed)
Addended by: Enedina Finner on: 03/01/2023 05:12 PM   Modules accepted: Orders

## 2023-03-04 LAB — TISSUE SPECIMEN

## 2023-03-04 LAB — PATHOLOGY REPORT

## 2023-03-05 ENCOUNTER — Encounter: Payer: Self-pay | Admitting: Podiatry

## 2023-03-08 ENCOUNTER — Ambulatory Visit (INDEPENDENT_AMBULATORY_CARE_PROVIDER_SITE_OTHER): Payer: Medicaid Other | Admitting: Podiatry

## 2023-03-08 ENCOUNTER — Encounter: Payer: Self-pay | Admitting: Podiatry

## 2023-03-08 DIAGNOSIS — M7989 Other specified soft tissue disorders: Secondary | ICD-10-CM

## 2023-03-08 NOTE — Progress Notes (Signed)
   Chief Complaint  Patient presents with   Routine Post Op    POV #1 - DOS 03/01/23   Excision of benign skin lesion RT foot   "Its been a little sore, but I did have to rewrap it cause it got wet in the shower"    Subjective:  Patient presents today status post excision of benign skin lesion lateral aspect of the right foot performed in office on 03/01/2023.  Patient doing well.  WBAT in the cam boot.  She says that the cam boot does irritate her foot  Past Medical History:  Diagnosis Date   Cancer ([facility]CC)    Diverticulitis    GERD (gastroesophageal reflux disease)    History of asplenia 07/10/2019   Hyperlipidemia 04/03/2022    Past Surgical History:  Procedure Laterality Date   ABDOMINAL HYSTERECTOMY     COLON SURGERY     EXCISION VAGINAL CYST Left 03/11/2022   Procedure: EXCISION SEBACEOUS CYST;  Surgeon: Lazaro Arms, MD;  Location: [facility];  Service: Gynecology;  Laterality: Left;   LAPAROSCOPIC BILATERAL SALPINGO OOPHERECTOMY Bilateral    REMOVAL OF GASTROINTESTINAL STOMATIC  TUMOR OF STOMACH     SMALL INTESTINE SURGERY     VULVAR LESION REMOVAL N/A 03/11/2022   Procedure: VULVAR BIOPSY WITH LASER ABLATION OF VULVAR;  Surgeon: Lazaro Arms, MD;  Location: [facility];  Service: Gynecology;  Laterality: N/A;  pt knows to arrive at 9:00    Allergies  Allergen Reactions   Other Hives    Steroids     Objective/Physical Exam Neurovascular status intact.  Incision well coapted with sutures intact. No sign of infectious process noted. No dehiscence. No active bleeding noted.   Assessment: 1. s/p excision of benign skin lesion lateral aspect of the right foot. DOS: 03/01/2023.  In office   Plan of Care:  -Patient was evaluated.  -Postsurgical shoe dispensed.  Discontinue cam boot.  She states the cam boot was rubbing against the foot -Patient may begin washing and showering getting the foot wet -Return to clinic 10 days for suture removal  Felecia Shelling, DPM [facility]  Dr. Felecia Shelling, DPM    [address] [place], [place] [address]                Office [phone]  Fax [phone]

## 2023-03-09 ENCOUNTER — Other Ambulatory Visit: Payer: Self-pay | Admitting: Podiatry

## 2023-03-09 DIAGNOSIS — B070 Plantar wart: Secondary | ICD-10-CM

## 2023-03-09 DIAGNOSIS — M79671 Pain in right foot: Secondary | ICD-10-CM

## 2023-03-09 DIAGNOSIS — M778 Other enthesopathies, not elsewhere classified: Secondary | ICD-10-CM

## 2023-03-14 NOTE — Addendum Note (Signed)
Addended by: Felecia Shelling on: 03/14/2023 10:11 PM   Modules accepted: Level of Service

## 2023-03-15 ENCOUNTER — Encounter: Payer: Medicaid Other | Admitting: Podiatry

## 2023-03-17 ENCOUNTER — Encounter: Payer: Self-pay | Admitting: Podiatry

## 2023-03-17 ENCOUNTER — Ambulatory Visit (INDEPENDENT_AMBULATORY_CARE_PROVIDER_SITE_OTHER): Payer: Medicaid Other | Admitting: Podiatry

## 2023-03-17 VITALS — BP 110/78 | HR 84 | Temp 97.5°F | Resp 18 | Ht 61.0 in | Wt 125.0 lb

## 2023-03-17 DIAGNOSIS — M7989 Other specified soft tissue disorders: Secondary | ICD-10-CM

## 2023-03-17 NOTE — Progress Notes (Signed)
   No chief complaint on file.   Subjective:  Patient presents today status post excision of benign skin lesion lateral aspect of the right foot performed in office on 03/01/2023.  Patient doing well.  WBAT in the surgical shoe.  No new complaints  Past Medical History:  Diagnosis Date   Cancer (HCC)    Diverticulitis    GERD (gastroesophageal reflux disease)    History of asplenia 07/10/2019   Hyperlipidemia 04/03/2022    Past Surgical History:  Procedure Laterality Date   ABDOMINAL HYSTERECTOMY     COLON SURGERY     EXCISION VAGINAL CYST Left 03/11/2022   Procedure: EXCISION SEBACEOUS CYST;  Surgeon: Lazaro Arms, MD;  Location: [facility];  Service: Gynecology;  Laterality: Left;   LAPAROSCOPIC BILATERAL SALPINGO OOPHERECTOMY Bilateral    REMOVAL OF GASTROINTESTINAL STOMATIC  TUMOR OF STOMACH     SMALL INTESTINE SURGERY     VULVAR LESION REMOVAL N/A 03/11/2022   Procedure: VULVAR BIOPSY WITH LASER ABLATION OF VULVAR;  Surgeon: Lazaro Arms, MD;  Location: [facility];  Service: Gynecology;  Laterality: N/A;  pt knows to arrive at 9:00    Allergies  Allergen Reactions   Other Hives    Steroids     Objective/Physical Exam Neurovascular status intact.  Incision well coapted with sutures intact. No sign of infectious process noted. No dehiscence. No active bleeding noted.   Assessment: 1. s/p excision of benign skin lesion lateral aspect of the right foot. DOS: 03/01/2023.  In office   Plan of Care:  -Patient was evaluated.  -Sutures removed -Patient may transition out of the postsurgical shoe into good supportive tennis shoes -Return to clinic as needed  Felecia Shelling, DPM [facility]  Dr. Felecia Shelling, DPM    [address] [place], [place] [address]                Office [phone]  Fax [phone]

## 2023-03-30 NOTE — Telephone Encounter (Signed)
Can we please reach out to patient and have her come in for f/u appt? Thanks so much! -Dr. Logan Bores

## 2023-04-06 ENCOUNTER — Ambulatory Visit: Payer: Medicaid Other | Admitting: Podiatry

## 2023-04-06 ENCOUNTER — Encounter: Payer: Self-pay | Admitting: Podiatry

## 2023-04-06 VITALS — BP 117/67 | HR 67

## 2023-04-06 DIAGNOSIS — L0889 Other specified local infections of the skin and subcutaneous tissue: Secondary | ICD-10-CM | POA: Diagnosis not present

## 2023-04-06 NOTE — Progress Notes (Signed)
   Chief Complaint  Patient presents with   Post-op Problem    "This foot is hateful. She doesn't like anyone today.  I don't understand it."    Subjective:  Patient presents today status post excision of benign skin lesion lateral aspect of the right foot performed in office on 03/01/2023.  Patient has noticed some pain and tenderness associated to the excision site especially during ambulation.  Presenting for further treatment evaluation  Past Medical History:  Diagnosis Date   Cancer [facility])    Diverticulitis    GERD (gastroesophageal reflux disease)    History of asplenia 07/10/2019   Hyperlipidemia 04/03/2022    Past Surgical History:  Procedure Laterality Date   ABDOMINAL HYSTERECTOMY     COLON SURGERY     EXCISION VAGINAL CYST Left 03/11/2022   Procedure: EXCISION SEBACEOUS CYST;  Surgeon: Lazaro Arms, MD;  Location: [facility];  Service: Gynecology;  Laterality: Left;   LAPAROSCOPIC BILATERAL SALPINGO OOPHERECTOMY Bilateral    REMOVAL OF GASTROINTESTINAL STOMATIC  TUMOR OF STOMACH     SMALL INTESTINE SURGERY     VULVAR LESION REMOVAL N/A 03/11/2022   Procedure: VULVAR BIOPSY WITH LASER ABLATION OF VULVAR;  Surgeon: Lazaro Arms, MD;  Location: [facility];  Service: Gynecology;  Laterality: N/A;  pt knows to arrive at 9:00    Allergies  Allergen Reactions   Other Hives    Steroids     Objective/Physical Exam Neurovascular status intact.  There is some hyperkeratotic tissue along the incision site.  Associated tenderness with palpation.  No erythema or edema.  Incision is nicely healed with exception of the callus tissue formation   Assessment: 1. s/p excision of benign skin lesion lateral aspect of the right foot. DOS: 03/01/2023.  2.  Pain lateral aspect of the right foot   Plan of Care:  -Patient was evaluated.  -Light debridement of the hyperkeratotic callus tissue was performed today using a 312 scalpel without incident or bleeding.  Patient did do some relief with  this - Advised against going barefoot.  Recommend good supportive tennis shoes and sneakers -Recommend shoes that are wide fitting and do not irritate or constrict the lateral aspect of the foot -Continue oral anti-inflammatory as needed -Return to clinic as needed  Felecia Shelling, DPM [facility]  Dr. Felecia Shelling, DPM    [address] [place], [place] [address]                Office [phone]  Fax [phone]

## 2023-04-07 ENCOUNTER — Ambulatory Visit: Payer: Medicaid Other | Admitting: Family Medicine

## 2023-04-07 VITALS — BP 112/68 | HR 69 | Temp 98.2°F | Ht 61.0 in | Wt 124.0 lb

## 2023-04-07 DIAGNOSIS — Z1231 Encounter for screening mammogram for malignant neoplasm of breast: Secondary | ICD-10-CM

## 2023-04-07 DIAGNOSIS — L97512 Non-pressure chronic ulcer of other part of right foot with fat layer exposed: Secondary | ICD-10-CM | POA: Diagnosis not present

## 2023-04-07 DIAGNOSIS — L309 Dermatitis, unspecified: Secondary | ICD-10-CM

## 2023-04-07 DIAGNOSIS — K219 Gastro-esophageal reflux disease without esophagitis: Secondary | ICD-10-CM

## 2023-04-07 DIAGNOSIS — Z23 Encounter for immunization: Secondary | ICD-10-CM

## 2023-04-07 DIAGNOSIS — Z1211 Encounter for screening for malignant neoplasm of colon: Secondary | ICD-10-CM

## 2023-04-07 MED ORDER — BUPROPION HCL ER (SR) 150 MG PO TB12: 150.0000 mg | ORAL_TABLET | Freq: Two times a day (BID) | ORAL | 2 refills | Status: DC

## 2023-04-07 MED ORDER — MUPIROCIN 2 % EX OINT: TOPICAL_OINTMENT | CUTANEOUS | 3 refills | Status: DC

## 2023-04-07 MED ORDER — PANTOPRAZOLE SODIUM 40 MG PO TBEC: DELAYED_RELEASE_TABLET | ORAL | 3 refills | Status: DC

## 2023-04-07 NOTE — Progress Notes (Addendum)
   Subjective:    Patient ID: BREE HEINZELMAN, female    DOB: April 13, 1971, 52 y.o.   MRN: [id number]  Discussed the use of AI scribe software for clinical note transcription with the patient, who gave verbal consent to proceed.  History of Present Illness   The patient, employed at at [employer], has been experiencing ongoing issues with her foot following multiple surgeries. The initial surgery was performed to remove what was thought to be a wart, but the issue recurred, leading to further surgical interventions. The most recent surgery was performed on September 9th, with stitches removed two weeks later. However, the patient reported that the surgical site remained slightly open after stitch removal, leading to discomfort, particularly when on her tiptoes or squatting at work. Despite this, the patient returned to work shortly after the surgery.  The patient reported to the surgeon that she was experiencing significant pain, despite not taking any pain medication. She had been using a muscle cream for relief. Upon a recent follow-up visit, the surgeon found that the surgical site had scabbed over, but underneath the scab, there was still a hole in the foot. The skin was healing from the outside in, rather than from the inside out, causing discomfort.  The patient also reported occasional oozing and throbbing pain from her belly button. She had previously been treated for a minor infection in the area. The patient has been attempting to manage this issue at home with cleaning and antibiotic ointment, but the discomfort persists.  The patient has a history of smoking, currently at about half a pack a day. She expressed a desire to quit but has concerns about potential weight gain. She is considering trying Wellbutrin to aid in smoking cessation.  The patient also mentioned a history of bodily dysplasia and has had COVID twice. She is considering getting a COVID booster shot. She is also due for a  mammogram and a colonoscopy, which she plans to schedule now that she has insurance.         Review of Systems     Objective:    Physical Exam   SKIN: Erythema and oozing noted around the umbilicus.     General-in no acute distress Eyes-no discharge Lungs-respiratory rate normal, CTA CV-no murmurs,RRR Extremities skin warm dry no edema Neuro grossly normal Behavior normal, alert Abdomen soft no masses some irritation at the umbilicus On foot exam has a healing wound but evidence of previous surgery no sign of infection     Assessment & Plan:  Assessment and Plan    Foot Surgery Complications Recurrent foot issues post-surgery with pain, oozing, and difficulty walking. The wound is healing from outside in, causing discomfort. -Continue Silvadene cream application. -Return to surgeon in two weeks for follow-up.  Tobacco Use Disorder Half pack per day smoker, expressed desire to quit but concerned about weight gain. -Start Wellbutrin twice daily to aid in smoking cessation. -Check-in via MyChart in 4-6 weeks to assess progress.  Umbilical Dermatitis Complaints of occasional oozing and discomfort from the belly button, possibly due to skin folds from weight loss. -Prescribe Bactroban ointment to apply with a Q-tip twice daily when symptomatic.  General Health Maintenance -Schedule mammogram now that insurance is available. -Resend colonoscopy packet for scheduling. -Administer influenza vaccine today. -Discussed COVID-19 booster shot, advised to get it in two weeks.     Reflux under good control continue current medication Referral for colonoscopy

## 2023-04-09 ENCOUNTER — Other Ambulatory Visit: Payer: Self-pay

## 2023-04-09 DIAGNOSIS — Z1211 Encounter for screening for malignant neoplasm of colon: Secondary | ICD-10-CM

## 2023-04-13 ENCOUNTER — Encounter: Payer: Self-pay | Admitting: *Deleted

## 2023-04-13 ENCOUNTER — Encounter: Payer: Self-pay | Admitting: Family Medicine

## 2023-04-16 ENCOUNTER — Ambulatory Visit: Payer: Medicaid Other | Admitting: Nurse Practitioner

## 2023-04-16 VITALS — BP 105/61 | HR 68 | Temp 97.5°F | Ht 61.0 in | Wt 123.2 lb

## 2023-04-16 DIAGNOSIS — S31105D Unspecified open wound of abdominal wall, periumbilic region without penetration into peritoneal cavity, subsequent encounter: Secondary | ICD-10-CM | POA: Diagnosis not present

## 2023-04-16 MED ORDER — DOXYCYCLINE HYCLATE 100 MG PO CAPS: 100.0000 mg | ORAL_CAPSULE | Freq: Two times a day (BID) | ORAL | 0 refills | Status: DC

## 2023-04-18 ENCOUNTER — Encounter: Payer: Self-pay | Admitting: Nurse Practitioner

## 2023-04-18 DIAGNOSIS — S31105A Unspecified open wound of abdominal wall, periumbilic region without penetration into peritoneal cavity, initial encounter: Secondary | ICD-10-CM | POA: Insufficient documentation

## 2023-04-18 NOTE — Progress Notes (Signed)
   Subjective:    Patient ID: Peggy Franklin, female    DOB: May 25, 1971, 52 y.o.   MRN: [id number]  HPI Presents for complaints of a flareup of her umbilical area drainage and infection.  Has had slightly greenish drainage redness and pain at the umbilical area.  Has had several infections and recurrent drainage since the first of the year.  No fever.  See previous notes.   Review of Systems  Constitutional:  Positive for fatigue. Negative for fever.  Respiratory:  Negative for cough, chest tightness and shortness of breath.   Cardiovascular:  Negative for chest pain.  Gastrointestinal:  Positive for abdominal pain.  Skin:  Positive for wound.       Objective:   Physical Exam NAD.  Alert, oriented.  Lungs clear.  Heart regular rate rhythm.  Localized tenderness noted at the umbilical area.  No obvious masses.  Faint pink discoloration noted around the umbilicus.  No active drainage noted at the moment. Today's Vitals   04/16/23 1539  BP: 105/61  Pulse: 68  Temp: (!) 97.5 F (36.4 C)  SpO2: 100%  Weight: 123 lb 3.2 oz (55.9 kg)  Height: 5\' 1"  (1.549 m)   Body mass index is 23.28 kg/m.        Assessment & Plan:   Problem List Items Addressed This Visit       Other   Open wound of umbilical region - Primary   Relevant Orders   Ambulatory referral to General Surgery   Meds ordered this encounter  Medications   doxycycline (VIBRAMYCIN) 100 MG capsule    Sig: Take 1 capsule (100 mg total) by mouth 2 (two) times daily.    Dispense:  20 capsule    Refill:  0    Order Specific Question:   Supervising Provider    Answer:   Lilyan Punt A [[id number]]   Start doxycycline as directed.  Due to the recurrence of her symptoms, will refer to general surgery for evaluation.  Warning signs reviewed.  Call back in the meantime if symptoms worsen or persist.

## 2023-04-19 ENCOUNTER — Ambulatory Visit: Payer: Medicaid Other | Admitting: Family Medicine

## 2023-04-21 ENCOUNTER — Ambulatory Visit (INDEPENDENT_AMBULATORY_CARE_PROVIDER_SITE_OTHER): Payer: Medicaid Other | Admitting: Podiatry

## 2023-04-21 DIAGNOSIS — L97512 Non-pressure chronic ulcer of other part of right foot with fat layer exposed: Secondary | ICD-10-CM | POA: Diagnosis not present

## 2023-04-21 NOTE — Progress Notes (Signed)
   Chief Complaint  Patient presents with   Routine Post Op    Still having drainage and pain, Denies N/V/F/C/SOB    Subjective:  Patient presents today status post excision of benign skin lesion lateral aspect of the right foot performed in office on 03/01/2023.  Patient continues to have pain and tenderness.  She has been applying the Silvadene cream as instructed with a light dressing.  Recently the patient was placed on oral doxycycline for unrelated issues associated to a draining navel.  Prescribed doxycycline 100 mg BID x 10 days beginning 04/17/2023.  Past Medical History:  Diagnosis Date   Cancer [facility])    Diverticulitis    GERD (gastroesophageal reflux disease)    History of asplenia 07/10/2019   Hyperlipidemia 04/03/2022    Past Surgical History:  Procedure Laterality Date   ABDOMINAL HYSTERECTOMY     COLON SURGERY     EXCISION VAGINAL CYST Left 03/11/2022   Procedure: EXCISION SEBACEOUS CYST;  Surgeon: Lazaro Arms, MD;  Location: [facility];  Service: Gynecology;  Laterality: Left;   LAPAROSCOPIC BILATERAL SALPINGO OOPHERECTOMY Bilateral    REMOVAL OF GASTROINTESTINAL STOMATIC  TUMOR OF STOMACH     SMALL INTESTINE SURGERY     VULVAR LESION REMOVAL N/A 03/11/2022   Procedure: VULVAR BIOPSY WITH LASER ABLATION OF VULVAR;  Surgeon: Lazaro Arms, MD;  Location: [facility];  Service: Gynecology;  Laterality: N/A;  pt knows to arrive at 9:00    Allergies  Allergen Reactions   Other Hives    Steroids     RT foot 04/21/2023.  Postdebridement  RT foot 04/21/2023.  Predebridement  Objective/Physical Exam Ulcer noted to the incision site along the lateral aspect of the right foot.  There is no exposed bone muscle tendon ligament or mobile joint.  Granular wound base.  Measures approximately 1.3 x 1.5 x 0.1 cm.  Maceration noted periwound.  Scant serous drainage noted with no purulence.  No malodor.   Assessment: 1. s/p excision of benign skin lesion lateral aspect of the  right foot. DOS: 03/01/2023.  2.  Ulcer lateral aspect of the right foot   Plan of Care:  -Patient was evaluated.  -Light debridement of the hyperkeratotic callus tissue was performed today using a 312 scalpel without incident or bleeding.  Patient did do some relief with this - Continue Silvadene cream with a light dressing daily -Continue prescribed doxycycline x 10 days -Return to clinic 2 weeks  Felecia Shelling, DPM [facility]  Dr. Felecia Shelling, DPM    [address] [place], [place] [address]                Office [phone]  Fax [phone]

## 2023-05-05 ENCOUNTER — Encounter: Payer: Self-pay | Admitting: Podiatry

## 2023-05-05 ENCOUNTER — Ambulatory Visit (INDEPENDENT_AMBULATORY_CARE_PROVIDER_SITE_OTHER): Payer: Medicaid Other | Admitting: Podiatry

## 2023-05-05 DIAGNOSIS — M7989 Other specified soft tissue disorders: Secondary | ICD-10-CM

## 2023-05-05 NOTE — Progress Notes (Signed)
   Chief Complaint  Patient presents with   Routine Post Op    PATIENT STATES THAT SHE IS JUST HERE FOR A RECHECK ON HER RF, THE PAIN COMES AND GOES BUT IT IS A WHOLE LOT BETTER PAIN LEVEL IS AT A 2 .     Subjective:  Patient presents today status post excision of benign skin lesion lateral aspect of the right foot performed in office on 03/01/2023.  Overall the patient has noticed significant improvement since last visit.  She says that the pain has decreased from 8/10 down to 2/10.  No new complaints  Past Medical History:  Diagnosis Date   Cancer (HCC)    Diverticulitis    GERD (gastroesophageal reflux disease)    History of asplenia 07/10/2019   Hyperlipidemia 04/03/2022    Past Surgical History:  Procedure Laterality Date   ABDOMINAL HYSTERECTOMY     COLON SURGERY     EXCISION VAGINAL CYST Left 03/11/2022   Procedure: EXCISION SEBACEOUS CYST;  Surgeon: Lazaro Arms, MD;  Location: [facility];  Service: Gynecology;  Laterality: Left;   LAPAROSCOPIC BILATERAL SALPINGO OOPHERECTOMY Bilateral    REMOVAL OF GASTROINTESTINAL STOMATIC  TUMOR OF STOMACH     SMALL INTESTINE SURGERY     VULVAR LESION REMOVAL N/A 03/11/2022   Procedure: VULVAR BIOPSY WITH LASER ABLATION OF VULVAR;  Surgeon: Lazaro Arms, MD;  Location: [facility];  Service: Gynecology;  Laterality: N/A;  pt knows to arrive at 9:00    Allergies  Allergen Reactions   Other Hives    Steroids     RT foot 04/21/2023.  Postdebridement  RT foot 04/21/2023.  Predebridement  Objective/Physical Exam The ulcer along the incision site has completely resolved.  Complete reepithelialization has occurred.  No drainage.  No maceration.  There is some callus tissue around the area but no open wound   Assessment: 1. s/p excision of benign skin lesion lateral aspect of the right foot. DOS: 03/01/2023.  2.  Ulcer lateral aspect of the right foot; resolved   Plan of Care:  -Patient was evaluated.  - Light debridement of the callus  tissue was performed today using a 312 scalpel without incident or bleeding -Foot miracle lotion was provided for the patient to moisturize the area daily to keep it moisturized and soft -Recommend good supportive to shoes and sneakers -Return to clinic as needed  Felecia Shelling, DPM [facility]  Dr. Felecia Shelling, DPM    [address] [place], [place] [address]                Office [phone]  Fax [phone]

## 2023-05-11 ENCOUNTER — Encounter: Payer: Self-pay | Admitting: General Surgery

## 2023-05-11 ENCOUNTER — Ambulatory Visit (INDEPENDENT_AMBULATORY_CARE_PROVIDER_SITE_OTHER): Payer: Medicaid Other | Admitting: General Surgery

## 2023-05-11 VITALS — BP 118/77 | HR 67 | Temp 98.0°F | Resp 14 | Ht 61.0 in | Wt 122.0 lb

## 2023-05-11 DIAGNOSIS — L03316 Cellulitis of umbilicus: Secondary | ICD-10-CM | POA: Insufficient documentation

## 2023-05-11 NOTE — Progress Notes (Unsigned)
[facility] History and Physical  Reason for Referral:*** Referring Physician: ***  Chief Complaint   New Patient (Initial Visit)     Peggy Franklin is a 52 y.o. female.  HPI: ***.  The *** started *** and has had a duration of ***.  It is associated with ***.  The *** is improved with ***, and is made worse with ***.    Quality*** Context***  Past Medical History:  Diagnosis Date   Cancer (HCC)    Diverticulitis    GERD (gastroesophageal reflux disease)    History of asplenia 07/10/2019   Hyperlipidemia 04/03/2022    Past Surgical History:  Procedure Laterality Date   ABDOMINAL HYSTERECTOMY     COLON SURGERY     EXCISION VAGINAL CYST Left 03/11/2022   Procedure: EXCISION SEBACEOUS CYST;  Surgeon: Lazaro Arms, MD;  Location: [facility];  Service: Gynecology;  Laterality: Left;   LAPAROSCOPIC BILATERAL SALPINGO OOPHERECTOMY Bilateral    REMOVAL OF GASTROINTESTINAL STOMATIC  TUMOR OF STOMACH     SMALL INTESTINE SURGERY     VULVAR LESION REMOVAL N/A 03/11/2022   Procedure: VULVAR BIOPSY WITH LASER ABLATION OF VULVAR;  Surgeon: Lazaro Arms, MD;  Location: [facility];  Service: Gynecology;  Laterality: N/A;  pt knows to arrive at 9:00    Family History  Problem Relation Age of Onset   Emphysema Paternal Grandfather    Stroke Paternal Grandmother    Cancer Maternal Grandmother        renal and uterine cancer   Heart attack Maternal Grandmother    Other Maternal Grandfather        accident   Cancer Mother 61       pancreatic cancer   Other Sister        house fire   Heart attack Sister    Heart attack Daughter     Social History   Tobacco Use   Smoking status: Every Day    Current packs/day: 0.50    Average packs/day: 0.5 packs/day for 10.1 years (5.0 ttl pk-yrs)    Types: Cigarettes    Start date: 04/06/2013   Smokeless tobacco: Never   Tobacco comments:    Smokes E cig  Vaping Use   Vaping status: Never Used  Substance Use Topics   Alcohol  use: No   Drug use: No    Medications: {medication reviewed/display:3041432} Allergies as of 05/11/2023       Reactions   Other Hives   Steroids         Medication List        Accurate as of May 11, 2023 12:09 PM. If you have any questions, ask your nurse or doctor.          STOP taking these medications    doxycycline 100 MG capsule Commonly known as: VIBRAMYCIN Stopped by: Lucretia Roers       TAKE these medications    buPROPion 150 MG 12 hr tablet Commonly known as: Wellbutrin SR Take 1 tablet (150 mg total) by mouth 2 (two) times daily.   pantoprazole 40 MG tablet Commonly known as: PROTONIX TAKE 1 TABLET(40 MG) BY MOUTH DAILY   silver sulfADIAZINE 1 % cream Commonly known as: SILVADENE Apply 1 Application topically daily.         ROS:  {Review of Systems:30496}  Blood pressure 118/77, pulse 67, temperature 98 F (36.7 C), temperature source Oral, resp. rate 14, height 5\' 1"  (1.549 m), weight 122 lb (55.3  kg), SpO2 94%. Physical Exam  Results: No results found for this or any previous visit (from the past 48 hour(s)).  No results found.   Assessment & Plan:  Peggy Franklin is a 52 y.o. female with *** -*** -*** -Follow up ***  All questions were answered to the satisfaction of the patient and family***.  The risk and benefits of *** were discussed including but not limited to ***.  After careful consideration, Peggy Franklin has decided to ***.    Lucretia Roers 05/11/2023, 12:09 PM

## 2023-05-11 NOTE — Patient Instructions (Signed)
Will get CT scan and will go from there. They may find something obvious causing this like a cyst or they may not see anything. This could be an old permanent suture or what we call a granuloma causing this recurrent infection. We would be do an exploration to look at the area and cleaning it out/ debriding area to try to remove any infection.

## 2023-05-12 MED ORDER — AMOXICILLIN-POT CLAVULANATE 875-125 MG PO TABS: 1.0000 | ORAL_TABLET | Freq: Two times a day (BID) | ORAL | 0 refills | Status: AC

## 2023-05-28 ENCOUNTER — Ambulatory Visit (INDEPENDENT_AMBULATORY_CARE_PROVIDER_SITE_OTHER): Payer: Medicaid Other | Admitting: Obstetrics & Gynecology

## 2023-05-28 ENCOUNTER — Encounter: Payer: Self-pay | Admitting: Obstetrics & Gynecology

## 2023-05-28 VITALS — BP 108/68 | HR 70 | Ht 61.0 in | Wt 124.0 lb

## 2023-05-28 DIAGNOSIS — A6004 Herpesviral vulvovaginitis: Secondary | ICD-10-CM

## 2023-05-28 MED ORDER — VALACYCLOVIR HCL 1 G PO TABS: 1000.0000 mg | ORAL_TABLET | Freq: Two times a day (BID) | ORAL | 1 refills | Status: DC

## 2023-05-28 NOTE — Progress Notes (Signed)
Chief Complaint  Patient presents with   Follow-up    Right labial pressure "feels like 3 tampons stuck in side pushing"      52 y.o. [id number] No LMP recorded. Patient has had a hysterectomy. The current method of family planning is status post hysterectomy.  Outpatient Encounter Medications as of 05/28/2023  Medication Sig   buPROPion (WELLBUTRIN SR) 150 MG 12 hr tablet Take 1 tablet (150 mg total) by mouth 2 (two) times daily.   pantoprazole (PROTONIX) 40 MG tablet TAKE 1 TABLET(40 MG) BY MOUTH DAILY   silver sulfADIAZINE (SILVADENE) 1 % cream Apply 1 Application topically daily.   valACYclovir (VALTREX) 1000 MG tablet Take 1 tablet (1,000 mg total) by mouth 2 (two) times daily.   No facility-administered encounter medications on file as of 05/28/2023.    Subjective Pt reports tenderness right vulva for about a week "Feels like 3 tampoins stuffed over there"  Past Medical History:  Diagnosis Date   Cancer (HCC)    Diverticulitis    GERD (gastroesophageal reflux disease)    History of asplenia 07/10/2019   Hyperlipidemia 04/03/2022    Past Surgical History:  Procedure Laterality Date   ABDOMINAL HYSTERECTOMY     COLON SURGERY     EXCISION VAGINAL CYST Left 03/11/2022   Procedure: EXCISION SEBACEOUS CYST;  Surgeon: Lazaro Arms, MD;  Location: [facility];  Service: Gynecology;  Laterality: Left;   LAPAROSCOPIC BILATERAL SALPINGO OOPHERECTOMY Bilateral    REMOVAL OF GASTROINTESTINAL STOMATIC  TUMOR OF STOMACH     SMALL INTESTINE SURGERY     VULVAR LESION REMOVAL N/A 03/11/2022   Procedure: VULVAR BIOPSY WITH LASER ABLATION OF VULVAR;  Surgeon: Lazaro Arms, MD;  Location: [facility];  Service: Gynecology;  Laterality: N/A;  pt knows to arrive at 9:00    OB History     Gravida  3   Para  3   Term  2   Preterm  1   AB      Living  2      SAB      IAB      Ectopic      Multiple      Live Births  1           Allergies  Allergen Reactions    Other Hives    Steroids     Social History   Socioeconomic History   Marital status: Media planner    Spouse name: Not on file   Number of children: Not on file   Years of education: Not on file   Highest education level: Not on file  Occupational History   Not on file  Tobacco Use   Smoking status: Every Day    Current packs/day: 0.50    Average packs/day: 0.5 packs/day for 10.1 years (5.1 ttl pk-yrs)    Types: Cigarettes    Start date: 04/06/2013   Smokeless tobacco: Never   Tobacco comments:    Smokes E cig  Vaping Use   Vaping status: Never Used  Substance and Sexual Activity   Alcohol use: No   Drug use: No   Sexual activity: Yes    Birth control/protection: Surgical    Comment: hyst  Other Topics Concern   Not on file  Social History Narrative   Not on file   Social Determinants of Health   Financial Resource Strain: Not on file  Food Insecurity: Not on file  Transportation Needs: Not on  file  Physical Activity: Not on file  Stress: Not on file  Social Connections: Not on file    Family History  Problem Relation Age of Onset   Emphysema Paternal Grandfather    Stroke Paternal Grandmother    Cancer Maternal Grandmother        renal and uterine cancer   Heart attack Maternal Grandmother    Other Maternal Grandfather        accident   Cancer Mother 66       pancreatic cancer   Other Sister        house fire   Heart attack Sister    Heart attack Daughter     Medications:       Current Outpatient Medications:    buPROPion (WELLBUTRIN SR) 150 MG 12 hr tablet, Take 1 tablet (150 mg total) by mouth 2 (two) times daily., Disp: 60 tablet, Rfl: 2   pantoprazole (PROTONIX) 40 MG tablet, TAKE 1 TABLET(40 MG) BY MOUTH DAILY, Disp: 90 tablet, Rfl: 3   silver sulfADIAZINE (SILVADENE) 1 % cream, Apply 1 Application topically daily., Disp: , Rfl:    valACYclovir (VALTREX) 1000 MG tablet, Take 1 tablet (1,000 mg total) by mouth 2 (two) times daily., Disp: 20  tablet, Rfl: 1  Objective Blood pressure 108/68, pulse 70, height 5\' 1"  (1.549 m), weight 124 lb (56.2 kg).  Small ulcer right labia minora (Pt denies previous outbreaks) Consistent with HSV1(sent PCR but pretty dry at this point) quite tender still Vulvar VIN well healed on left no new lesions  Pertinent ROS No burning with urination, frequency or urgency No nausea, vomiting or diarrhea Nor fever chills or other constitutional symptoms   Labs or studies pending    Impression + Management Plan: Diagnoses this Encounter::   ICD-10-CM   1. Herpes, vulvar, right sided, lesion consistent with HSV 1  A60.04    Valtrex 1 gram BID x 10 days        Medications prescribed during  this encounter: Meds ordered this encounter  Medications   valACYclovir (VALTREX) 1000 MG tablet    Sig: Take 1 tablet (1,000 mg total) by mouth 2 (two) times daily.    Dispense:  20 tablet    Refill:  1    Labs or Scans Ordered during this encounter: No orders of the defined types were placed in this encounter.     Follow up Return in about 6 months (around 11/26/2023).

## 2023-05-28 NOTE — Addendum Note (Signed)
Addended by: Moss Mc on: 05/28/2023 10:54 AM   Modules accepted: Orders

## 2023-05-31 LAB — HERPES SIMPLEX VIRUS CULTURE

## 2023-06-17 ENCOUNTER — Ambulatory Visit (HOSPITAL_COMMUNITY)
Admission: RE | Admit: 2023-06-17 | Discharge: 2023-06-17 | Disposition: A | Discharge status: Home / Self Care | Payer: Medicaid Other | Source: Ambulatory Visit | Attending: General Surgery | Admitting: General Surgery

## 2023-06-17 DIAGNOSIS — L03316 Cellulitis of umbilicus: Secondary | ICD-10-CM | POA: Insufficient documentation

## 2023-06-17 MED ORDER — IOHEXOL 300 MG/ML  SOLN
100.0000 mL | Freq: Once | INTRAMUSCULAR | Status: AC | PRN
  Administered 2023-06-17: 100 mL via INTRAVENOUS

## 2023-06-20 ENCOUNTER — Telehealth (INDEPENDENT_AMBULATORY_CARE_PROVIDER_SITE_OTHER): Payer: Self-pay | Admitting: General Surgery

## 2023-06-20 DIAGNOSIS — L03316 Cellulitis of umbilicus: Secondary | ICD-10-CM

## 2023-06-20 DIAGNOSIS — S31105D Unspecified open wound of abdominal wall, periumbilic region without penetration into peritoneal cavity, subsequent encounter: Secondary | ICD-10-CM

## 2023-06-20 DIAGNOSIS — S30851A Superficial foreign body of abdominal wall, initial encounter: Secondary | ICD-10-CM

## 2023-06-20 NOTE — Telephone Encounter (Signed)
[facility]  Patient called ED yesterday and reported drainage from her umbilical area as she has had prior. ED notified me.   CT done 12/26 with no obvious intraabdominal source or signs of any hernia. Area of fluid/inflammation at the umbilical stalk area noted and best seen on Sagittal. This is now draining.   I talked to the patient on 12/28 and she had an antibiotic Augmentin she could start. She has started this. She reported greenish drainage.  I think she has a suture that is causing chronic infections in the area. I told her if any worsening issue to go to the ED.   Called patient today and left VM to tell her that I had reviewed CT and that we should plan for debridement of the area to hopefully remove a suture granuloma /old flexon suture that is likely the source of these chronic infections. Will have to plan to pack this area after debridement.   Will get the Office to get the CT read on Monday. Once official read not noting any intraabdominal source can plan to do this debridement potentially Thursday or Friday.   CPT wound be 10121- incision and removal of foreign body, subcutaneous tissue complicated   Algis Greenhouse, MD [facility] [address] [place], [place] [address] [phone] (office)

## 2023-06-21 NOTE — Telephone Encounter (Signed)
Call placed to [facility] and imaging moved to STAT to be read.

## 2023-06-22 ENCOUNTER — Encounter (HOSPITAL_COMMUNITY): Payer: Self-pay

## 2023-06-22 ENCOUNTER — Other Ambulatory Visit: Payer: Self-pay

## 2023-06-22 ENCOUNTER — Encounter (HOSPITAL_COMMUNITY)
Admission: RE | Admit: 2023-06-22 | Discharge: 2023-06-22 | Disposition: A | Discharge status: Home / Self Care | Payer: Medicaid Other | Source: Ambulatory Visit | Attending: General Surgery | Admitting: General Surgery

## 2023-06-22 NOTE — Telephone Encounter (Signed)
 Imaging resulted and provider reviewed.   Recommended proceeding with exploration of area for removal of foreign body.  Call placed to patient and patient made aware of provider recommendations.

## 2023-06-22 NOTE — Addendum Note (Signed)
 Addended by: Phillips Odor on: 06/22/2023 10:22 AM   Modules accepted: Orders

## 2023-06-22 NOTE — Telephone Encounter (Signed)
 Call placed to [facility] again and imaging moved to STAT to be read.

## 2023-06-22 NOTE — Pre-Procedure Instructions (Signed)
 Attempted pre-op phonecall. Left VM for her to call us back.

## 2023-06-24 ENCOUNTER — Encounter (INDEPENDENT_AMBULATORY_CARE_PROVIDER_SITE_OTHER): Payer: Self-pay | Admitting: *Deleted

## 2023-06-24 ENCOUNTER — Other Ambulatory Visit: Payer: Self-pay

## 2023-06-24 ENCOUNTER — Ambulatory Visit (HOSPITAL_COMMUNITY)
Admission: RE | Admit: 2023-06-24 | Discharge: 2023-06-24 | Disposition: A | Discharge status: Home / Self Care | Payer: Medicaid Other | Attending: General Surgery | Admitting: General Surgery

## 2023-06-24 ENCOUNTER — Ambulatory Visit (HOSPITAL_COMMUNITY): Payer: Medicaid Other | Admitting: Anesthesiology

## 2023-06-24 ENCOUNTER — Encounter (HOSPITAL_COMMUNITY): Payer: Self-pay | Admitting: General Surgery

## 2023-06-24 ENCOUNTER — Encounter (HOSPITAL_COMMUNITY)
Admission: RE | Disposition: A | Discharge status: Home / Self Care | Payer: Self-pay | Source: Home / Self Care | Attending: General Surgery

## 2023-06-24 DIAGNOSIS — L02226 Furuncle of umbilicus: Secondary | ICD-10-CM | POA: Insufficient documentation

## 2023-06-24 DIAGNOSIS — L03316 Cellulitis of umbilicus: Secondary | ICD-10-CM | POA: Diagnosis present

## 2023-06-24 DIAGNOSIS — K219 Gastro-esophageal reflux disease without esophagitis: Secondary | ICD-10-CM | POA: Insufficient documentation

## 2023-06-24 DIAGNOSIS — K5730 Diverticulosis of large intestine without perforation or abscess without bleeding: Secondary | ICD-10-CM | POA: Insufficient documentation

## 2023-06-24 DIAGNOSIS — F17210 Nicotine dependence, cigarettes, uncomplicated: Secondary | ICD-10-CM | POA: Insufficient documentation

## 2023-06-24 DIAGNOSIS — I10 Essential (primary) hypertension: Secondary | ICD-10-CM | POA: Insufficient documentation

## 2023-06-24 DIAGNOSIS — I700 Atherosclerosis of aorta: Secondary | ICD-10-CM | POA: Diagnosis not present

## 2023-06-24 HISTORY — PX: INCISION AND DRAINAGE ABSCESS: SHX5864

## 2023-06-24 SURGERY — INCISION AND DRAINAGE, ABSCESS
Anesthesia: General | Site: Abdomen

## 2023-06-24 MED ORDER — FENTANYL CITRATE PF 50 MCG/ML IJ SOSY: 25.0000 ug | PREFILLED_SYRINGE | INTRAMUSCULAR | Status: DC | PRN

## 2023-06-24 MED ORDER — FENTANYL CITRATE (PF) 100 MCG/2ML IJ SOLN
INTRAMUSCULAR | Status: DC | PRN
  Administered 2023-06-24: 100 ug via INTRAVENOUS

## 2023-06-24 MED ORDER — CEFAZOLIN SODIUM-DEXTROSE 2-4 GM/100ML-% IV SOLN
2.0000 g | INTRAVENOUS | Status: AC
  Administered 2023-06-24: 2 g via INTRAVENOUS

## 2023-06-24 MED ORDER — CHLORHEXIDINE GLUCONATE CLOTH 2 % EX PADS: 6.0000 | MEDICATED_PAD | Freq: Once | CUTANEOUS | Status: DC

## 2023-06-24 MED ORDER — LACTATED RINGERS IV SOLN: INTRAVENOUS | Status: DC

## 2023-06-24 MED ORDER — BACITRACIN 500 UNIT/GM EX OINT
TOPICAL_OINTMENT | CUTANEOUS | Status: DC | PRN
  Administered 2023-06-24: 1 via TOPICAL

## 2023-06-24 MED ORDER — OXYCODONE HCL 5 MG PO TABS: 5.0000 mg | ORAL_TABLET | ORAL | 0 refills | Status: DC | PRN

## 2023-06-24 MED ORDER — ONDANSETRON HCL 4 MG/2ML IJ SOLN: 4.0000 mg | Freq: Once | INTRAMUSCULAR | Status: DC | PRN

## 2023-06-24 MED ORDER — CEFAZOLIN SODIUM-DEXTROSE 2-4 GM/100ML-% IV SOLN
INTRAVENOUS | Status: AC
  Filled 2023-06-24: qty 100

## 2023-06-24 MED ORDER — OXYCODONE HCL 5 MG/5ML PO SOLN: 5.0000 mg | Freq: Once | ORAL | Status: DC | PRN

## 2023-06-24 MED ORDER — BACITRACIN ZINC 500 UNIT/GM EX OINT
TOPICAL_OINTMENT | CUTANEOUS | Status: AC
  Filled 2023-06-24: qty 0.9

## 2023-06-24 MED ORDER — ORAL CARE MOUTH RINSE
15.0000 mL | Freq: Once | OROMUCOSAL | Status: AC
Start: 2023-06-24 — End: 2023-06-24

## 2023-06-24 MED ORDER — OXYCODONE HCL 5 MG PO TABS: 5.0000 mg | ORAL_TABLET | Freq: Once | ORAL | Status: DC | PRN

## 2023-06-24 MED ORDER — ONDANSETRON HCL 4 MG/2ML IJ SOLN
INTRAMUSCULAR | Status: AC
  Filled 2023-06-24: qty 2

## 2023-06-24 MED ORDER — CHLORHEXIDINE GLUCONATE 0.12 % MT SOLN
15.0000 mL | Freq: Once | OROMUCOSAL | Status: AC
  Administered 2023-06-24: 15 mL via OROMUCOSAL

## 2023-06-24 MED ORDER — SODIUM CHLORIDE 0.9 % IR SOLN
Status: DC | PRN
  Administered 2023-06-24: 1000 mL

## 2023-06-24 MED ORDER — ONDANSETRON HCL 4 MG PO TABS: 4.0000 mg | ORAL_TABLET | Freq: Three times a day (TID) | ORAL | 1 refills | Status: DC | PRN

## 2023-06-24 MED ORDER — CHLORHEXIDINE GLUCONATE 0.12 % MT SOLN
OROMUCOSAL | Status: AC
  Filled 2023-06-24: qty 15

## 2023-06-24 MED ORDER — FENTANYL CITRATE (PF) 100 MCG/2ML IJ SOLN
INTRAMUSCULAR | Status: AC
  Filled 2023-06-24: qty 2

## 2023-06-24 MED ORDER — PROPOFOL 10 MG/ML IV BOLUS
INTRAVENOUS | Status: AC
  Filled 2023-06-24: qty 20

## 2023-06-24 MED ORDER — ONDANSETRON HCL 4 MG/2ML IJ SOLN
INTRAMUSCULAR | Status: DC | PRN
  Administered 2023-06-24: 4 mg via INTRAVENOUS

## 2023-06-24 MED ORDER — MIDAZOLAM HCL 2 MG/2ML IJ SOLN
INTRAMUSCULAR | Status: AC
  Filled 2023-06-24: qty 2

## 2023-06-24 MED ORDER — MIDAZOLAM HCL 5 MG/5ML IJ SOLN
INTRAMUSCULAR | Status: DC | PRN
  Administered 2023-06-24: 2 mg via INTRAVENOUS

## 2023-06-24 MED ORDER — PROPOFOL 10 MG/ML IV BOLUS
INTRAVENOUS | Status: DC | PRN
  Administered 2023-06-24: 150 mg via INTRAVENOUS

## 2023-06-24 SURGICAL SUPPLY — 24 items
CLOTH BEACON ORANGE TIMEOUT ST (SAFETY) ×1 IMPLANT
COVER LIGHT HANDLE STERIS (MISCELLANEOUS) ×2 IMPLANT
DRSG TEGADERM 4X4.75 (GAUZE/BANDAGES/DRESSINGS) IMPLANT
ELECT REM PT RETURN 9FT ADLT (ELECTROSURGICAL) ×1
ELECTRODE REM PT RTRN 9FT ADLT (ELECTROSURGICAL) ×1 IMPLANT
GAUZE PACKING IODOFORM 1/2INX (GAUZE/BANDAGES/DRESSINGS) IMPLANT
GAUZE PACKING IODOFORM 1/4X15 (PACKING) IMPLANT
GAUZE SPONGE 4X4 12PLY STRL (GAUZE/BANDAGES/DRESSINGS) ×2 IMPLANT
GLOVE BIO SURGEON STRL SZ 6.5 (GLOVE) ×1 IMPLANT
GLOVE BIOGEL PI IND STRL 6.5 (GLOVE) ×1 IMPLANT
GLOVE BIOGEL PI IND STRL 7.0 (GLOVE) ×2 IMPLANT
GOWN STRL REUS W/TWL LRG LVL3 (GOWN DISPOSABLE) ×2 IMPLANT
KIT TURNOVER KIT A (KITS) ×1 IMPLANT
MANIFOLD NEPTUNE II (INSTRUMENTS) ×1 IMPLANT
NS IRRIG 1000ML POUR BTL (IV SOLUTION) ×1 IMPLANT
PACK MINOR (CUSTOM PROCEDURE TRAY) ×1 IMPLANT
PAD ARMBOARD 7.5X6 YLW CONV (MISCELLANEOUS) ×1 IMPLANT
POSITIONER HEAD 8X9X4 ADT (SOFTGOODS) ×1 IMPLANT
SET BASIN LINEN APH (SET/KITS/TRAYS/PACK) ×1 IMPLANT
SOL PREP PROV IODINE SCRUB 4OZ (MISCELLANEOUS) ×1 IMPLANT
STAPLER VISISTAT (STAPLE) IMPLANT
SUT VIC AB 3-0 SH 27X BRD (SUTURE) IMPLANT
SUT VICRYL 0 UR6 27IN ABS (SUTURE) IMPLANT
SYR BULB IRRIG 60ML STRL (SYRINGE) ×1 IMPLANT

## 2023-06-24 NOTE — Transfer of Care (Signed)
 Immediate Anesthesia Transfer of Care Note  Patient: Peggy Franklin  Procedure(s) Performed: REMOVAL FOREIGN BODY ABDOMINAL  Patient Location: PACU  Anesthesia Type:General  Level of Consciousness: awake, alert , oriented, and patient cooperative  Airway & Oxygen Therapy: Patient Spontanous Breathing and Patient connected to face mask oxygen  Post-op Assessment: Report given to RN, Post -op Vital signs reviewed and stable, and Patient moving all extremities X 4  Post vital signs: Reviewed and stable  Last Vitals:  Vitals Value Taken Time  BP 121/68 06/24/23 0937  Temp 36.7 C 06/24/23 0937  Pulse 55 06/24/23 0941  Resp 14 06/24/23 0941  SpO2 100 % 06/24/23 0941  Vitals shown include unfiled device data.  Last Pain:  Vitals:   06/24/23 0657  PainSc: 0-No pain         Complications: No notable events documented.

## 2023-06-24 NOTE — Progress Notes (Signed)
 Patients surgery time changed to 0830, patient informed of new time.  Patient voiced understanding. Offered to get patients daughter to sit at bedside, patient declined.  Lights dimmed and lowered patients head for comfort at her request.

## 2023-06-24 NOTE — Progress Notes (Signed)
 [facility]  Updated daughter. Appears she has some ingrown hairs and a sinus track. No obvious suture noted. Debridement of the umbilical stalk completed.   Will need to avoid lifting > 20 lbs and excessive reaching over her head for the next 2 weeks.  Will see in the office 07/07/2203.   She will need to start placing bacitracin  into the belly button in addition to a strip of the plain packing gauze, starting 06/26/2023. She will do this daily.  Manuelita Pander, MD [facility] [address] [place], [place] [address] [phone] (office)

## 2023-06-24 NOTE — H&P (Signed)
 [facility] History and Physical      Peggy Franklin is a 53 y.o. female.  HPI: Peggy Franklin is a 53 yo who has had prior SBR for a duodenal tumor. She says that this was in 2009 in [facility] and she has been doing well since that time. She also had a tubal ligation in the past. She says that she has been having drainage from her umbilicus and infections since January 2024. This has occurred once every 2-3 weeks.    She has lost weight over the last 15 years but this has been intentional and gradual.  CT without anything obvious that would be causing drainage like a urachal cyst.   Past Medical History:  Diagnosis Date   Cancer (HCC)    Diverticulitis    GERD (gastroesophageal reflux disease)    History of asplenia 07/10/2019   Hyperlipidemia 04/03/2022    Past Surgical History:  Procedure Laterality Date   ABDOMINAL HYSTERECTOMY     COLON SURGERY     EXCISION VAGINAL CYST Left 03/11/2022   Procedure: EXCISION SEBACEOUS CYST;  Surgeon: Jayne Vonn DEL, MD;  Location: [facility];  Service: Gynecology;  Laterality: Left;   LAPAROSCOPIC BILATERAL SALPINGO OOPHERECTOMY Bilateral    REMOVAL OF GASTROINTESTINAL STOMATIC  TUMOR OF STOMACH     SMALL INTESTINE SURGERY     SPLENECTOMY, TOTAL     VULVAR LESION REMOVAL N/A 03/11/2022   Procedure: VULVAR BIOPSY WITH LASER ABLATION OF VULVAR;  Surgeon: Jayne Vonn DEL, MD;  Location: [facility];  Service: Gynecology;  Laterality: N/A;  pt knows to arrive at 9:00    Family History  Problem Relation Age of Onset   Emphysema Paternal Grandfather    Stroke Paternal Grandmother    Cancer Maternal Grandmother        renal and uterine cancer   Heart attack Maternal Grandmother    Other Maternal Grandfather        accident   Cancer Mother 60       pancreatic cancer   Other Sister        house fire   Heart attack Sister    Heart attack Daughter     Social History   Tobacco Use   Smoking status: Every Day    Current packs/day:  0.50    Average packs/day: 0.5 packs/day for 10.2 years (5.1 ttl pk-yrs)    Types: Cigarettes    Start date: 04/06/2013   Smokeless tobacco: Never   Tobacco comments:    Smokes E cig  Vaping Use   Vaping status: Never Used  Substance Use Topics   Alcohol use: No   Drug use: No    Medications: I have reviewed the patient's current medications. Current Facility-Administered Medications  Medication Dose Route Frequency Provider Last Rate Last Admin   ceFAZolin  (ANCEF ) IVPB 2g/100 mL premix  2 g Intravenous On Call to OR Kallie Manuelita BROCKS, MD       chlorhexidine  (PERIDEX ) 0.12 % solution            Chlorhexidine  Gluconate Cloth 2 % PADS 6 each  6 each Topical Once Kallie Manuelita BROCKS, MD       lactated ringers  infusion   Intravenous Continuous Kendell Brendan J, MD        Medications Prior to Admission  Medication Sig Dispense Refill   buPROPion  (WELLBUTRIN  SR) 150 MG 12 hr tablet Take 1 tablet (150 mg total) by mouth 2 (two) times daily. 60 tablet 2  pantoprazole  (PROTONIX ) 40 MG tablet TAKE 1 TABLET(40 MG) BY MOUTH DAILY 90 tablet 3   silver  sulfADIAZINE  (SILVADENE ) 1 % cream Apply 1 Application topically daily.     valACYclovir  (VALTREX ) 1000 MG tablet Take 1 tablet (1,000 mg total) by mouth 2 (two) times daily. 20 tablet 1    Allergies  Allergen Reactions   Other Hives    Steroids       ROS:  A comprehensive review of systems was negative except for: Gastrointestinal: positive for umbilical drainage, infection  Blood pressure 122/78, pulse 75, temperature 97.8 F (36.6 C), resp. rate 18, height 5' 1 (1.549 m), weight 53.9 kg, SpO2 100%. Physical Exam Vitals reviewed.  HENT:     Head: Normocephalic.     Nose: Nose normal.  Eyes:     Pupils: Pupils are equal, round, and reactive to light.  Cardiovascular:     Rate and Rhythm: Normal rate.  Pulmonary:     Effort: Pulmonary effort is normal.  Abdominal:     General: There is no distension.     Palpations: Abdomen is  soft.     Tenderness: There is abdominal tenderness.     Comments: Minor redness around umbilical region, no active drainage   Musculoskeletal:        General: Normal range of motion.     Cervical back: Normal range of motion.  Skin:    General: Skin is warm.  Neurological:     General: No focal deficit present.     Mental Status: She is alert and oriented to person, place, and time.  Psychiatric:        Mood and Affect: Mood normal.        Behavior: Behavior normal.        Thought Content: Thought content normal.     Results: personally reviewed and no obvious signs of anything intraabdominal, talk looks prominent to me and could have some haziness/ fluid around it in my view  CLINICAL DATA:  Abdominal pain.  Cellulitis in the umbilical region.   EXAM: CT ABDOMEN AND PELVIS WITH CONTRAST   TECHNIQUE: Multidetector CT imaging of the abdomen and pelvis was performed using the standard protocol following bolus administration of intravenous contrast.   RADIATION DOSE REDUCTION: This exam was performed according to the departmental dose-optimization program which includes automated exposure control, adjustment of the mA and/or kV according to patient size and/or use of iterative reconstruction technique.   CONTRAST:  OMNIPAQUE  IOHEXOL  300 MG/ML  SOLN   COMPARISON:  12/29/2021   FINDINGS: Lower chest: No acute findings.   Hepatobiliary: Similar appearance left hepatic cyst. Scattered tiny hypodensities in the liver parenchyma are too small to characterize but are statistically most likely benign. No followup imaging is recommended. Nonvisualization of the gallbladder suggest prior cholecystectomy No intrahepatic or extrahepatic biliary dilation.   Pancreas: No focal mass lesion. No dilatation of the main duct. No intraparenchymal cyst. No peripancreatic edema.   Spleen: No splenomegaly. No suspicious focal mass lesion.   Adrenals/Urinary Tract: No adrenal nodule  or mass. Scattered areas of focal cortical scarring noted right kidney. Tiny hypodensities in the left kidney are too small to characterize but are similar to prior and statistically most likely benign. No followup imaging is recommended. No evidence for hydroureter. The urinary bladder appears normal for the degree of distention.   Stomach/Bowel: Stomach is unremarkable. No gastric wall thickening. No evidence of outlet obstruction. Duodenum is normally positioned as is the ligament of Treitz.  No small bowel wall thickening. No small bowel dilatation. The terminal ileum is normal. The appendix is not well visualized, but there is no edema or inflammation in the region of the cecal tip to suggest appendicitis. No gross colonic mass. No colonic wall thickening. Prominent wall thickening noted in the sigmoid colon, similar to prior on a background of left colonic diverticulosis. Wall thickening in the sigmoid colon likely related to the underlying diverticular disease.   Vascular/Lymphatic: There is moderate atherosclerotic calcification of the abdominal aorta without aneurysm. No abdominal lymphadenopathy. No pelvic sidewall lymphadenopathy.   Reproductive: Hysterectomy.  There is no adnexal mass.   Other: No intraperitoneal free fluid.   Musculoskeletal: No skin thickening or subcutaneous edema in the region of the umbilicus. No features to suggest underlying urachal remnant/cyst. The umbilicus has a somewhat band or cord-like. No worrisome lytic or sclerotic osseous abnormality. Soft tissue configuration but there is no underlying fluid collection to suggest abscess   IMPRESSION: 1. No skin thickening or subcutaneous edema in the region of the umbilicus to suggest cellulitis. No features to suggest underlying urachal remnant/urachal cyst. The umbilicus has a somewhat band or cord-like configuration but there is no underlying fluid collection to suggest abscess. 2. Prominent  sigmoid colon wall thickening , similar to prior and superimposed on a background of advanced left colonic diverticulosis. Wall thickening likely reflects muscular hypertrophy secondary to the diverticulosis. 3.  Aortic Atherosclerosis (ICD10-I70.0).     Electronically Signed   By: Camellia Candle M.D.   On: 06/22/2023 09:17    Assessment & Plan:  Peggy Franklin is a 53 y.o. female with female with recurrent umbilical drainage. This could be from a suture or a cyst. I do not see anything obvious. I am suspicious of suture. I see documentation from 2007 from Dr. Edsel for a small lower abdomen laparotomy that was for her right salpingo oophorectomy and he closed it with 0 Flexon which appears to be a non-absorbable suture.    CT with not obvious.  Will then proceed with exploration looking for a suture granulation or infected suture. Discussed risk of bleeding, infection, hernia formation, not finding anything obvious, continued infections.  All questions were answered to the satisfaction of the patient.   Will need colonoscopy as outpatient as she has never had one.   All questions were answered to the satisfaction of the patient.   Manuelita JAYSON Pander 06/24/2023, 8:30 AM

## 2023-06-24 NOTE — Anesthesia Procedure Notes (Signed)
 Procedure Name: LMA Insertion Date/Time: 06/24/2023 8:46 AM  Performed by: Cordella Elvie HERO, CRNAPre-anesthesia Checklist: Patient identified, Emergency Drugs available, Suction available, Patient being monitored and Timeout performed Patient Re-evaluated:Patient Re-evaluated prior to induction Oxygen Delivery Method: Circle system utilized Preoxygenation: Pre-oxygenation with 100% oxygen Induction Type: IV induction LMA: LMA inserted LMA Size: 4.0 Number of attempts: 1 Placement Confirmation: positive ETCO2, CO2 detector and breath sounds checked- equal and bilateral Tube secured with: Tape Dental Injury: Teeth and Oropharynx as per pre-operative assessment

## 2023-06-24 NOTE — Op Note (Signed)
 [facility] Operative Note  06/24/23  Preoperative Diagnosis: Cellulitis, infection of the umbilicus    Postoperative Diagnosis: Cellulitis, infection of the umbilicus, furuncle of the umbilicus    Procedure(s) Performed: Excision and debridement of base of umbilical stalk; 1.5cm X1.5cm (2.25 sq cm)    Surgeon: Manuelita BROCKS. Kallie, MD   Assistants: No qualified resident was available    Anesthesia: General endotracheal   Anesthesiologist: Kendell Yvonna PARAS, MD    Specimens: Umbilical stalk    Estimated Blood Loss: Minimal   Blood Replacement: None    Complications: None   Wound Class: Dirty/ Infected   Operative Indications: Ms. Rolin is a 53 yo with a chronic draining umbilical region that has been draining green purulence and requiring antibiotics. She has a history of prior surgeries and we thought she could have a suture granuloma from a prior surgery and opted to explore the area. We discussed risk of bleeding, infection, finding nothing, continued infections.   Findings: Embedded hairs deep in the umbilical stalk at the base with sinus track and purulent drainage    Procedure: The patient was taken to the operating room and placed supine. General endotracheal anesthesia was induced. Intravenous antibiotics were  administered per protocol.   The abdomen was prepped and draped in the usual sterile fashion.   I inverted the umbilical stalk and noted two long hairs that I removed. When I squeezed the end, whitish material was expressed. I could not easily retract her umbilical stalk as it was over 2-3cm deep to her fascia. I opted to make a small vertical incision around the umbilicus and carried this down to the fascia. I got around the umbilical stalk. I noted no signs of suture or purulence in this plane.  I opted to excise the base of the umbilical stalk where the issues were arising.  Using a scalpel, I came across the umbilical stalk and excised the base,  noting a sinus  track with white/ purulent like drainage in the talk. I noted embedded hairs deep in the umbilical stalk at the base with sinus track and purulent drainage  I debrided the edges of the stalk with sharp excisional debridement, and cauterized the edges. This was sent for pathology review. The fascia was intact but I did feel a small bump, and I used sharp excisional debridement with scissors to cut the anterior layer of fascia to ensure no suture was present. In total the excisional debridement measured 2.25cm sq.  The fascia was opened by about 0.5cm and was just the anterior fascia. I closed this with a 0 Vicryl suture to re-approximate. The wound was irrigated. I then closed the umbilical stalk with 3-0 Vicryl interrupted sutures, and tacked the stalk back down to the fascia with interrupted 3-0 Vicryl suture. Hemostasis was achieved. The skin was closed with staples. I placed bacitracin  and plain packing 1/2 inch gauze into her new umbilical stalk and a gauze and tegaderm dressing was placed.   Final inspection revealed acceptable hemostasis. All counts were correct at the end of the case. The patient was awakened from anesthesia and extubated without complication.  The patient went to the PACU in stable condition.   Manuelita Kallie, MD [facility] [address] [place], [place] [address] [phone] (office)

## 2023-06-24 NOTE — Discharge Instructions (Signed)
 Discharge Instructions:  Keep your bandage on for 48 hours. After 48 hours replace. Bacitracin  into the deep part of the bellybutton and then place packing strip into the area.  Do this packing daily. You can shower after 48 hours but keep the area covered and dry. Patting everything dry and replacing the bandage after showering.  Do not lift over 20 lbs until seen by me in the office and the staples removed (07/07/2023).   Common Complaints: Pain at the incision site is common.  Some nausea is common and poor appetite after anesthesia.  The main goal is to stay hydrated the first few days after surgery.   Diet/ Activity: Diet as tolerated. You may not have an appetite, but it is important to stay hydrated. Drink 64 ounces of water  a day. Your appetite will return with time.   Limit excessive movement, lifting > 20 lbs that stretches the area too much too soon.    Medication: Take tylenol  and ibuprofen as needed for pain control, alternating every 4-6 hours.  Example:  Tylenol  1000mg  @ 6am, 12noon, 6pm, (Do not exceed 4000mg  of tylenol  a day). Ibuprofen 800mg  @ 9am, 3pm, 9pm, 3am (Do not exceed 3600mg  of ibuprofen a day).  Take Roxicodone  for breakthrough pain every 4 hours.  Take Colace for constipation related to narcotic pain medication. If you do not have a bowel movement in 2 days, take Miralax over the counter.  Drink plenty of water  to also prevent constipation.   Contact Information: If you have questions or concerns, please call our office, [phone], Monday- Thursday 8AM-5PM and Friday 8AM-12Noon.  If it is after hours or on the weekend, please call [facility] Main Number, [phone], [phone], and ask to speak to the surgeon on call for Dr. Kallie at [facility].

## 2023-06-24 NOTE — Anesthesia Preprocedure Evaluation (Signed)
 Anesthesia Evaluation  Patient identified by MRN, date of birth, ID band Patient awake    Reviewed: Allergy & Precautions, H&P , NPO status , Patient's Chart, lab work & pertinent test results, reviewed documented beta blocker date and time   Airway Mallampati: II  TM Distance: >3 FB Neck ROM: full    Dental no notable dental hx.    Pulmonary neg pulmonary ROS, Current Smoker   Pulmonary exam normal breath sounds clear to auscultation       Cardiovascular Exercise Tolerance: Good hypertension, negative cardio ROS  Rhythm:regular Rate:Normal     Neuro/Psych  PSYCHIATRIC DISORDERS Anxiety Depression    negative neurological ROS     GI/Hepatic Neg liver ROS,GERD  ,,  Endo/Other  negative endocrine ROS    Renal/GU negative Renal ROS  negative genitourinary   Musculoskeletal   Abdominal   Peds  Hematology negative hematology ROS (+)   Anesthesia Other Findings   Reproductive/Obstetrics negative OB ROS                             Anesthesia Physical Anesthesia Plan  ASA: 2  Anesthesia Plan: General and General ETT   Post-op Pain Management:    Induction:   PONV Risk Score and Plan: Ondansetron   Airway Management Planned:   Additional Equipment:   Intra-op Plan:   Post-operative Plan:   Informed Consent: I have reviewed the patients History and Physical, chart, labs and discussed the procedure including the risks, benefits and alternatives for the proposed anesthesia with the patient or authorized representative who has indicated his/her understanding and acceptance.     Dental Advisory Given  Plan Discussed with: CRNA  Anesthesia Plan Comments:        Anesthesia Quick Evaluation

## 2023-06-25 ENCOUNTER — Encounter (HOSPITAL_COMMUNITY): Payer: Self-pay | Admitting: General Surgery

## 2023-06-25 LAB — SURGICAL PATHOLOGY

## 2023-06-29 NOTE — Progress Notes (Signed)
 Noted.

## 2023-06-29 NOTE — Progress Notes (Signed)
 I updated patient about the pathology. She is doing well. Will get staples out next week.

## 2023-07-02 NOTE — Anesthesia Postprocedure Evaluation (Signed)
 Anesthesia Post Note  Patient: Peggy Franklin  Procedure(s) Performed: REMOVAL FOREIGN BODY ABDOMINAL (Abdomen)  Patient location during evaluation: Phase II Anesthesia Type: General Level of consciousness: awake Pain management: pain level controlled Vital Signs Assessment: post-procedure vital signs reviewed and stable Respiratory status: spontaneous breathing and respiratory function stable Cardiovascular status: blood pressure returned to baseline and stable Postop Assessment: no headache and no apparent nausea or vomiting Anesthetic complications: no Comments: Late entry   No notable events documented.   Last Vitals:  Vitals:   06/24/23 1000 06/24/23 1012  BP: 126/77 119/75  Pulse: (!) 56   Resp: 17 18  Temp:  36.7 C  SpO2: 99% 98%    Last Pain:  Vitals:   06/24/23 1012  TempSrc: Oral  PainSc: 4                  Yvonna JINNY Bosworth

## 2023-07-06 ENCOUNTER — Telehealth: Payer: Self-pay | Admitting: *Deleted

## 2023-07-06 NOTE — Telephone Encounter (Signed)
 Surgical Date: 06/24/2023 Procedure: REMOVAL FOREIGN BODY, ABDOMEN  Received call from patient [phone]~ telephone.   Patient reports that she was more active today assisting her daughter with her own surgery. States that she did more bending over, but did not lift anything for her daughter.   States that she also had episode of sneezing while driving that she was unable to brace abdomen for.   Reports that when she returned home, she noted clot like substance in umbilicus. States that she also had some bright red blood on her bandage.   Advised that increased movement and bending could have irritated her staples and caused the bright red blood noted on her bandage. Advised that as long as bleeding is not copious amounts like soaking a bandage through, then she can continue to monitor. Advised to wash area with warm soapy water  and to pat dry. Advised to not attempt to remove clot like substance in navel.   Advised patient if bleeding noted to increase to soaking her bandage on the abdomen to go to ER for evaluation.   Reports that she is scheduled to be seen on 07/08/2023 to have her staples removed. Advised that provider will evaluate at that time.

## 2023-07-07 ENCOUNTER — Encounter: Payer: Medicaid Other | Admitting: General Surgery

## 2023-07-08 ENCOUNTER — Encounter: Payer: Self-pay | Admitting: General Surgery

## 2023-07-08 ENCOUNTER — Ambulatory Visit (INDEPENDENT_AMBULATORY_CARE_PROVIDER_SITE_OTHER): Payer: Medicaid Other | Admitting: General Surgery

## 2023-07-08 VITALS — BP 110/71 | HR 60 | Temp 97.9°F | Resp 16 | Ht 61.0 in | Wt 125.0 lb

## 2023-07-08 DIAGNOSIS — T8149XA Infection following a procedure, other surgical site, initial encounter: Secondary | ICD-10-CM

## 2023-07-08 MED ORDER — AMOXICILLIN-POT CLAVULANATE 875-125 MG PO TABS: 1.0000 | ORAL_TABLET | Freq: Two times a day (BID) | ORAL | 0 refills | Status: AC

## 2023-07-08 NOTE — Patient Instructions (Signed)
Irrigate with saline daily and pack with idoform gauze. Will need less and less packing each day. Keep covered with showering.

## 2023-07-08 NOTE — Progress Notes (Signed)
[facility]  Has been having drainage and pain. Felt a pop pressure after helping someone who was post op. Drainage now is purulent.   BP 110/71   Pulse 60   Temp 97.9 F (36.6 C) (Oral)   Resp 16   Ht 5\' 1"  (1.549 m)   Wt 125 lb (56.7 kg)   SpO2 97%   BMI 23.62 kg/m  Staples removed, no erythema or drainage, upper portion of wound probed, purulent drain, saline irrigated, packed with iodoform, area like size of gum ball, no fascial violation   Patient s/p  Excision and debridement of base of umbilical stalk in dirty infected case. Not surprising that she developed an infection with her chronic drainage and inflammation she had preop.   Irrigate with saline daily and pack with idoform gauze. Will need less and less packing each day. Keep covered with showering.  Augmentin ordered  Future Appointments  Date Time Provider Department Center  07/15/2023  2:30 PM Lucretia Roers, MD [facility] None  12/06/2023  2:50 PM Gerda Diss, Jonna Coup, MD [facility]   Algis Greenhouse, MD [facility] [address] [place], [place] [address] [phone] (office)

## 2023-07-15 ENCOUNTER — Ambulatory Visit: Payer: Medicaid Other | Admitting: General Surgery

## 2023-07-15 ENCOUNTER — Encounter: Payer: Self-pay | Admitting: General Surgery

## 2023-07-15 VITALS — BP 109/72 | HR 67 | Temp 97.7°F | Resp 16 | Ht 61.0 in | Wt 127.0 lb

## 2023-07-15 DIAGNOSIS — T8149XA Infection following a procedure, other surgical site, initial encounter: Secondary | ICD-10-CM

## 2023-07-15 NOTE — Patient Instructions (Addendum)
Keep area covered and do peroxide to area and the belly button for next 4-5 days. Ok to go back to work.  Call with issues. If have recurrent infections, may have to re-explore excise more area and leave open to pack. Hopefully this is not the case and the area removed was sufficient to get rid of the infection with the infected ingrown hairs.

## 2023-07-16 NOTE — Progress Notes (Signed)
[facility]  Taking antibiotic and has been packing the area. Can no longer pack. Less sore.   BP 109/72   Pulse 67   Temp 97.7 F (36.5 C) (Oral)   Resp 16   Ht 5\' 1"  (1.549 m)   Wt 127 lb (57.6 kg)   SpO2 94%   BMI 24.00 kg/m  Umbilical incision superior edge probed with silver nitrate stick and did not enter past the superficial skin level, nothing further to pack, umbilicus probed too and nothing in the umbilicus  Patient s/p excision of ingrown hair/ follicle with pathology consistent with chronic granulation and inflammation after having chronically draining and infected area of the umbilicus.  Keep area covered and do peroxide to area and the belly button for next 4-5 days. Ok to go back to work.  Call with issues. If have recurrent infections, may have to re-explore excise more area and leave open to pack. Hopefully this is not the case and the area removed was sufficient to get rid of the infection with the infected ingrown hairs.   Algis Greenhouse, MD [facility] [address] [place], [place] [address] [phone] (office)

## 2023-09-01 ENCOUNTER — Ambulatory Visit
Admission: RE | Admit: 2023-09-01 | Discharge: 2023-09-01 | Disposition: A | Discharge status: Home / Self Care | Source: Ambulatory Visit | Attending: Family Medicine | Admitting: Family Medicine

## 2023-09-01 VITALS — BP 99/67 | HR 102 | Temp 98.6°F | Resp 16

## 2023-09-01 DIAGNOSIS — J101 Influenza due to other identified influenza virus with other respiratory manifestations: Secondary | ICD-10-CM

## 2023-09-01 LAB — POC COVID19/FLU A&B COMBO
Covid Antigen, POC: NEGATIVE
Influenza A Antigen, POC: POSITIVE — AB
Influenza B Antigen, POC: NEGATIVE

## 2023-09-01 MED ORDER — PROMETHAZINE-DM 6.25-15 MG/5ML PO SYRP: 5.0000 mL | ORAL_SOLUTION | Freq: Four times a day (QID) | ORAL | 0 refills | Status: DC | PRN

## 2023-09-01 MED ORDER — OSELTAMIVIR PHOSPHATE 75 MG PO CAPS: 75.0000 mg | ORAL_CAPSULE | Freq: Two times a day (BID) | ORAL | 0 refills | Status: DC

## 2023-09-01 NOTE — ED Triage Notes (Signed)
 Pt reports bilateral knee pain, lower back pain, shoulder pain and cough sinus drainage.

## 2023-09-01 NOTE — ED Provider Notes (Signed)
 [facility]    CSN: [id number] Arrival date & time: 09/01/23  1443      History   Chief Complaint No chief complaint on file.   HPI Peggy Franklin is a 53 y.o. female.   Patient presenting today with bodyaches, cough, congestion, sinus drainage, fatigue.  Denies chest pain, shortness of breath, abdominal pain, vomiting, diarrhea.  So far not tried anything other than Mucinex which is providing only mild relief.  No known sick contacts recently.    Past Medical History:  Diagnosis Date   Cancer (HCC)    Diverticulitis    GERD (gastroesophageal reflux disease)    History of asplenia 07/10/2019   Hyperlipidemia 04/03/2022    Patient Active Problem List   Diagnosis Date Noted   Furuncle of umbilicus 06/24/2023   Cellulitis of umbilicus 05/11/2023   Open wound of umbilical region 04/18/2023   Depression with anxiety 05/08/2022   PTSD (post-traumatic stress disorder) 05/08/2022   Hyperlipidemia 04/03/2022   VIN III (vulvar intraepithelial neoplasia III)    Sebaceous cyst    Lichen planus 01/21/2022   Vesicle of skin 01/21/2022   Aortic atherosclerosis (HCC) 01/01/2022   Smoker 01/01/2022   Sore throat 09/24/2020   Non-recurrent acute suppurative otitis media of right ear without spontaneous rupture of tympanic membrane 09/24/2020   Seasonal allergic rhinitis due to pollen 09/24/2020   COVID-19 virus infection 07/12/2020   Close exposure to 2019 novel coronavirus 07/10/2020   Diarrhea 05/30/2020   Cough 02/05/2020   Head congestion 02/05/2020   History of asplenia 07/10/2019   Genital warts 04/08/2018   Anxiety 04/08/2018   Anal skin tag 03/28/2015   Gastroesophageal reflux disease without esophagitis 03/07/2015   Postsurgical dumping syndrome 01/20/2013    Past Surgical History:  Procedure Laterality Date   ABDOMINAL HYSTERECTOMY     COLON SURGERY     EXCISION VAGINAL CYST Left 03/11/2022   Procedure: EXCISION SEBACEOUS CYST;  Surgeon: Lazaro Arms, MD;  Location: [facility];  Service: Gynecology;  Laterality: Left;   INCISION AND DRAINAGE ABSCESS N/A 06/24/2023   Procedure: REMOVAL FOREIGN BODY ABDOMINAL;  Surgeon: Lucretia Roers, MD;  Location: [facility];  Service: General;  Laterality: N/A;   LAPAROSCOPIC BILATERAL SALPINGO OOPHERECTOMY Bilateral    REMOVAL OF GASTROINTESTINAL STOMATIC  TUMOR OF STOMACH     SMALL INTESTINE SURGERY     SPLENECTOMY, TOTAL     VULVAR LESION REMOVAL N/A 03/11/2022   Procedure: VULVAR BIOPSY WITH LASER ABLATION OF VULVAR;  Surgeon: Lazaro Arms, MD;  Location: [facility];  Service: Gynecology;  Laterality: N/A;  pt knows to arrive at 9:00    OB History     Gravida  3   Para  3   Term  2   Preterm  1   AB      Living  2      SAB      IAB      Ectopic      Multiple      Live Births  1            Home Medications    Prior to Admission medications   Medication Sig Start Date End Date Taking? Authorizing Provider  oseltamivir (TAMIFLU) 75 MG capsule Take 1 capsule (75 mg total) by mouth every 12 (twelve) hours. 09/01/23  Yes Particia Nearing, PA-C  promethazine-dextromethorphan (PROMETHAZINE-DM) 6.25-15 MG/5ML syrup Take 5 mLs by mouth 4 (four) times daily as needed. 09/01/23  Yes Particia Nearing, PA-C  buPROPion [facility] SR) 150 MG 12 hr tablet Take 1 tablet (150 mg total) by mouth 2 (two) times daily. Patient not taking: Reported on 07/15/2023 04/07/23   Babs Sciara, MD  pantoprazole (PROTONIX) 40 MG tablet TAKE 1 TABLET(40 MG) BY MOUTH DAILY 04/07/23   Babs Sciara, MD    Family History Family History  Problem Relation Age of Onset   Emphysema Paternal Grandfather    Stroke Paternal Grandmother    Cancer Maternal Grandmother        renal and uterine cancer   Heart attack Maternal Grandmother    Other Maternal Grandfather        accident   Cancer Mother 31       pancreatic cancer   Other Sister        house fire   Heart attack Sister    Heart  attack Daughter     Social History Social History   Tobacco Use   Smoking status: Every Day    Current packs/day: 0.50    Average packs/day: 0.5 packs/day for 10.4 years (5.2 ttl pk-yrs)    Types: Cigarettes    Start date: 04/06/2013   Smokeless tobacco: Never   Tobacco comments:    Smokes E cig  Vaping Use   Vaping status: Never Used  Substance Use Topics   Alcohol use: No   Drug use: No     Allergies   Other   Review of Systems Review of Systems Per HPI  Physical Exam Triage Vital Signs ED Triage Vitals  Encounter Vitals Group     BP 09/01/23 1444 99/67     Systolic BP Percentile --      Diastolic BP Percentile --      Pulse Rate 09/01/23 1444 (!) 102     Resp 09/01/23 1444 16     Temp 09/01/23 1444 98.6 F (37 C)     Temp Source 09/01/23 1444 Oral     SpO2 09/01/23 1444 92 %     Weight --      Height --      Head Circumference --      Peak Flow --      Pain Score 09/01/23 1448 6     Pain Loc --      Pain Education --      Exclude from Growth Chart --    No data found.  Updated Vital Signs BP 99/67 (BP Location: Right Arm)   Pulse (!) 102   Temp 98.6 F (37 C) (Oral)   Resp 16   SpO2 92%   Visual Acuity Right Eye Distance:   Left Eye Distance:   Bilateral Distance:    Right Eye Near:   Left Eye Near:    Bilateral Near:     Physical Exam Vitals and nursing note reviewed.  Constitutional:      Appearance: Normal appearance.  HENT:     Head: Atraumatic.     Right Ear: Tympanic membrane and external ear normal.     Left Ear: Tympanic membrane and external ear normal.     Nose: Rhinorrhea present.     Mouth/Throat:     Mouth: Mucous membranes are moist.     Pharynx: Posterior oropharyngeal erythema present.  Eyes:     Extraocular Movements: Extraocular movements intact.     Conjunctiva/sclera: Conjunctivae normal.  Cardiovascular:     Rate and Rhythm: Normal rate and regular rhythm.  Heart sounds: Normal heart sounds.   Pulmonary:     Effort: Pulmonary effort is normal.     Breath sounds: Normal breath sounds. No wheezing or rales.  Musculoskeletal:        General: Normal range of motion.     Cervical back: Normal range of motion and neck supple.  Skin:    General: Skin is warm and dry.  Neurological:     Mental Status: She is alert and oriented to person, place, and time.  Psychiatric:        Mood and Affect: Mood normal.        Thought Content: Thought content normal.      UC Treatments / Results  Labs (all labs ordered are listed, but only abnormal results are displayed) Labs Reviewed  POC COVID19/FLU A&B COMBO - Abnormal; Notable for the following components:      Result Value   Influenza A Antigen, POC Positive (*)    All other components within normal limits    EKG   Radiology No results found.  Procedures Procedures (including critical care time)  Medications Ordered in UC Medications - No data to display  Initial Impression / Assessment and Plan / UC Course  I have reviewed the triage vital signs and the nursing notes.  Pertinent labs & imaging results that were available during my care of the patient were reviewed by me and considered in my medical decision making (see chart for details).     Rapid flu positive for influenza A.  Treat with Tamiflu, Phenergan DM, supportive over-the-counter medications and home care.  Return for worsening symptoms.  Final Clinical Impressions(s) / UC Diagnoses   Final diagnoses:  Influenza A   Discharge Instructions   None    ED Prescriptions     Medication Sig Dispense Auth. Provider   oseltamivir (TAMIFLU) 75 MG capsule Take 1 capsule (75 mg total) by mouth every 12 (twelve) hours. 10 capsule Particia Nearing, [place]   promethazine-dextromethorphan (PROMETHAZINE-DM) 6.25-15 MG/5ML syrup Take 5 mLs by mouth 4 (four) times daily as needed. 100 mL Particia Nearing, [place]      PDMP not reviewed this encounter.    Particia Nearing, [place] 09/01/23 1530

## 2023-09-08 ENCOUNTER — Ambulatory Visit: Payer: Medicaid Other | Admitting: Podiatry

## 2023-09-10 ENCOUNTER — Ambulatory Visit: Admitting: Family Medicine

## 2023-09-27 ENCOUNTER — Ambulatory Visit (INDEPENDENT_AMBULATORY_CARE_PROVIDER_SITE_OTHER): Admitting: Podiatry

## 2023-09-27 ENCOUNTER — Encounter: Payer: Self-pay | Admitting: Podiatry

## 2023-09-27 DIAGNOSIS — D2372 Other benign neoplasm of skin of left lower limb, including hip: Secondary | ICD-10-CM | POA: Diagnosis not present

## 2023-09-27 NOTE — Progress Notes (Unsigned)
   Chief Complaint  Patient presents with   Plantar Warts    Patient is here for wart center bottom left foot    Subjective: 53 y.o. female presenting to the office today for evaluation of pain and tenderness associated to a symptomatic skin lesion to the plantar aspect of the left foot.  She has a history of skin lesions to the plantar aspect of the right foot which are currently asymptomatic and tolerable   Past Medical History:  Diagnosis Date   Cancer (HCC)    Diverticulitis    GERD (gastroesophageal reflux disease)    History of asplenia 07/10/2019   Hyperlipidemia 04/03/2022    Past Surgical History:  Procedure Laterality Date   ABDOMINAL HYSTERECTOMY     COLON SURGERY     EXCISION VAGINAL CYST Left 03/11/2022   Procedure: EXCISION SEBACEOUS CYST;  Surgeon: Lazaro Arms, MD;  Location: [facility];  Service: Gynecology;  Laterality: Left;   INCISION AND DRAINAGE ABSCESS N/A 06/24/2023   Procedure: REMOVAL FOREIGN BODY ABDOMINAL;  Surgeon: Lucretia Roers, MD;  Location: [facility];  Service: General;  Laterality: N/A;   LAPAROSCOPIC BILATERAL SALPINGO OOPHERECTOMY Bilateral    REMOVAL OF GASTROINTESTINAL STOMATIC  TUMOR OF STOMACH     SMALL INTESTINE SURGERY     SPLENECTOMY, TOTAL     VULVAR LESION REMOVAL N/A 03/11/2022   Procedure: VULVAR BIOPSY WITH LASER ABLATION OF VULVAR;  Surgeon: Lazaro Arms, MD;  Location: [facility];  Service: Gynecology;  Laterality: N/A;  pt knows to arrive at 9:00    Allergies  Allergen Reactions   Other Hives    Steroids      Objective:  Physical Exam General: Alert and oriented x3 in no acute distress  Dermatology: Hyperkeratotic lesion(s) present on the plantar aspect of the left foot. Pain on palpation with a central nucleated core noted. Skin is warm, dry and supple bilateral lower extremities. Negative for open lesions or macerations.  Vascular: Palpable pedal pulses bilaterally. No edema or erythema noted. Capillary refill within  normal limits.  Neurological: Grossly intact via light touch  Musculoskeletal Exam: Pain on palpation at the keratotic lesion(s) noted. Range of motion within normal limits bilateral. Muscle strength 5/5 in all groups bilateral.  Assessment: 1.  Symptomatic benign skin lesion   Plan of Care:  -Patient evaluated -Excisional debridement of keratoic lesion(s) using a chisel blade was performed without incident.  -Salicylic acid applied with a bandaid -Return to the clinic PRN.   Felecia Shelling, DPM [facility]  Dr. Felecia Shelling, DPM    [address] [place], [place] [address]                Office [phone]  Fax [phone]

## 2023-10-12 ENCOUNTER — Encounter: Payer: Self-pay | Admitting: *Deleted

## 2023-10-14 ENCOUNTER — Telehealth: Payer: Self-pay | Admitting: *Deleted

## 2023-10-14 NOTE — Telephone Encounter (Signed)
 Received call from patient [phone]~ telephone.   Patient reports that she has some subcutaneous growths to her side that she has discussed with Dr.Bridges in the past. States that growths are becoming discolored and requested procedure appointment to have areas removed.   Discussed with patient. Patient reports that she has 2 cyst like masses on the left side of back near waist line. Reports that areas are small and tender, but do not appear inflamed at this time.   Discussed with Dr. Collene Dawson who is agreeable to procedure visit. Recommended that if areas are concerning at time of visit due to size or other issues, procedure will be canceled.   Patient agreeable and appointment scheduled.

## 2023-11-25 ENCOUNTER — Encounter: Payer: Self-pay | Admitting: General Surgery

## 2023-11-25 ENCOUNTER — Ambulatory Visit (INDEPENDENT_AMBULATORY_CARE_PROVIDER_SITE_OTHER): Admitting: General Surgery

## 2023-11-25 ENCOUNTER — Telehealth: Payer: Self-pay

## 2023-11-25 VITALS — BP 108/74 | HR 76 | Temp 98.1°F | Resp 14 | Ht 61.0 in | Wt 123.0 lb

## 2023-11-25 DIAGNOSIS — L723 Sebaceous cyst: Secondary | ICD-10-CM

## 2023-11-25 NOTE — Telephone Encounter (Signed)
 Who is your primary care physician: DR. Charlotta Cook  Reasons for the colonoscopy: SCREENING  Have you had a colonoscopy before?  NO  Do you have family history of colon cancer? NO  Previous colonoscopy with polyps removed? NO  Do you have a history colorectal cancer?   NO  Are you diabetic? If yes, Type 1 or Type 2?    NO  Do you have a prosthetic or mechanical heart valve? NO  Do you have a pacemaker/defibrillator?   NO  Have you had endocarditis/atrial fibrillation? NO  Have you had joint replacement within the last 12 months?  NO  Do you tend to be constipated or have to use laxatives? YES  Do you have any history of drugs or alchohol?  NO  Do you use supplemental oxygen?  NO  Have you had a stroke or heart attack within the last 6 months? NO  Do you take weight loss medication?  NO  For female patients: have you had a hysterectomy?  YES                                     are you post menopausal?       NO                                            do you still have your menstrual cycle? NO      Do you take any blood-thinning medications such as: (aspirin, warfarin, Plavix, Aggrenox)  NO  If yes we need the name, milligram, dosage and who is prescribing doctor  Current Outpatient Medications on File Prior to Visit  Medication Sig Dispense Refill   buPROPion  (WELLBUTRIN  SR) 150 MG 12 hr tablet Take 1 tablet (150 mg total) by mouth 2 (two) times daily. 60 tablet 2   oseltamivir  (TAMIFLU ) 75 MG capsule Take 1 capsule (75 mg total) by mouth every 12 (twelve) hours. 10 capsule 0   pantoprazole  (PROTONIX ) 40 MG tablet TAKE 1 TABLET(40 MG) BY MOUTH DAILY 90 tablet 3   promethazine -dextromethorphan (PROMETHAZINE -DM) 6.25-15 MG/5ML syrup Take 5 mLs by mouth 4 (four) times daily as needed. 100 mL 0   No current facility-administered medications on file prior to visit.    Allergies  Allergen Reactions   Other Hives    Steroids      Pharmacy: [facility] [address] [place]  Primary Insurance Name: [facility] [phone]  Best number where you can be reached: [phone]

## 2023-11-25 NOTE — Progress Notes (Unsigned)
 [facility] Procedure Note  11/25/23  Pre-procedure Diagnosis: ***   Post-procedure Diagnosis: Same***   Procedure(s) Performed: ***   Surgeon: Dixon Fredrickson. Collene Dawson, MD   Assistants: ***No qualified resident was available    Anesthesia: Lidocaine  1%**    Specimens: ***   Estimated Blood Loss: Minimal***  Wound Class:***   Procedure Indications: ***  Findings:***   Procedure: The patient was taken to the procedure room and placed ***. The *** was prepared and draped in the usual sterile fashion. Lidocaine  1% was injected***.   ***  Final inspection revealed acceptable hemostasis. The patient tolerated the procedure well.   Future Appointments  Date Time Provider Department Center  12/06/2023  2:50 PM Bennet Brasil, MD [facility]  12/09/2023  2:45 PM Awilda Bogus, MD [facility] None     Deena Farrier, MD [facility] [address] [place], [place] [address] [phone] (office)

## 2023-11-25 NOTE — Patient Instructions (Signed)
 Remove the bandages on 11/27/23. If you have any bleeding from the site, hold direct firm pressure for 10 minutes (knuckles turning white). This should take care of any bleeding. Place neosporin and bandage on the areas daily and keep covered for the next 5-7 days. It may be best to keep area covered until sutures out given activity and pants.  Shower per your regular routine.

## 2023-11-29 LAB — PATHOLOGY REPORT

## 2023-11-29 LAB — TISSUE PATH REPORT

## 2023-12-01 ENCOUNTER — Ambulatory Visit (INDEPENDENT_AMBULATORY_CARE_PROVIDER_SITE_OTHER): Payer: Self-pay | Admitting: General Surgery

## 2023-12-01 DIAGNOSIS — L723 Sebaceous cyst: Secondary | ICD-10-CM

## 2023-12-06 ENCOUNTER — Ambulatory Visit: Payer: Medicaid Other | Admitting: Family Medicine

## 2023-12-06 ENCOUNTER — Encounter: Payer: Self-pay | Admitting: Family Medicine

## 2023-12-06 VITALS — BP 124/68 | HR 91 | Temp 98.1°F | Ht 61.0 in | Wt 121.0 lb

## 2023-12-06 DIAGNOSIS — Z79899 Other long term (current) drug therapy: Secondary | ICD-10-CM

## 2023-12-06 DIAGNOSIS — K219 Gastro-esophageal reflux disease without esophagitis: Secondary | ICD-10-CM

## 2023-12-06 DIAGNOSIS — E785 Hyperlipidemia, unspecified: Secondary | ICD-10-CM

## 2023-12-06 DIAGNOSIS — I700 Atherosclerosis of aorta: Secondary | ICD-10-CM | POA: Diagnosis not present

## 2023-12-06 DIAGNOSIS — L03211 Cellulitis of face: Secondary | ICD-10-CM | POA: Diagnosis not present

## 2023-12-06 DIAGNOSIS — Z23 Encounter for immunization: Secondary | ICD-10-CM

## 2023-12-06 NOTE — Progress Notes (Signed)
   Subjective:    Patient ID: Peggy Franklin, female    DOB: 05-08-1971, 53 y.o.   MRN: [id number]  HPI 6 month f/u Weight loss/gain Nasal puffiness   Discussed the use of AI scribe software for clinical note transcription with the patient, who gave verbal consent to proceed.  History of Present Illness   Peggy Franklin is a 53 year old female who presents with persistent nasal swelling and pain following a recent flu infection.  She has persistent swelling and tenderness on the left side of her nose following an allergy attack characterized by frequent sneezing. The inside of her nose remains swollen and tender despite overall improvement in her condition. She denies any breathing difficulties or fever during her flu infection.  Approximately a month ago, she experienced significant knee pain, initially suspecting a possible COVID-19 infection. After visiting urgent care, she was diagnosed with influenza A. She had received the flu shot prior to her illness. The knee pain was severe, described as feeling like her knees had been 'run over by a brick'.  She has a history of a surgical procedure in January to address a pus-filled area near her belly button, which involved multiple incisions and stitches. Post-surgery, she experienced a complication where a stitch was popped, leading to bleeding and subsequent infection, which required antibiotics and wound packing. No further issues have occurred following this treatment.  She is awaiting scheduling for a colonoscopy, which has been delayed since January. Additionally, she has been contacted regarding a mammogram but has not yet scheduled it.  She smokes and is attempting to quit. She mentions a recent weight gain, which she attributes to reduced activity following her stomach surgery. She is trying to maintain a healthy diet.       Review of Systems     Objective:   Physical Exam General-in no acute distress Eyes-no  discharge Lungs-respiratory rate normal, CTA CV-no murmurs,RRR Extremities skin warm dry no edema Neuro grossly normal Behavior normal, alert        Assessment & Plan:  Assessment and Plan    Soft tissue infection of the nose Swelling and tenderness on the left side of the nose, likely staphylococcal infection. - Prescribe antibiotics targeting staphylococcal infections. - Advise minimizing sun exposure during antibiotic treatment. - Instruct to contact office if no significant improvement in two weeks.  Smoking Smoker attempting cessation. Discussed increased lung cancer risk and benefits of low-dose CT scan for early detection. - Initiate scheduling of low-dose CT scan.  General Health Maintenance Due for routine screenings: colonoscopy and mammogram. Discussed pneumonia vaccine due to smoking. - Order blood work for cholesterol and blood glucose levels. - Encourage scheduling of colonoscopy and mammogram. - Administer pneumonia vaccine.      Patient was encouraged to quit smoking We will go ahead with doxycycline  for the nose infection If ongoing trouble referral to ENT  1. Hyperlipidemia, unspecified hyperlipidemia type (Primary) Check lipid profile.  Healthy diet.  Calculate ASCVD once that result comes in - Lipid panel  2. Aortic atherosclerosis (HCC) Given aortic atherosclerosis of LDL up will add statin previous LDL look very good - Comprehensive metabolic panel with GFR - CBC  3. Cellulitis of face Doxycycline  twice daily 10 days - Comprehensive metabolic panel with GFR - CBC  4. Gastroesophageal reflux disease without esophagitis Continue pantoprazole .  5. Immunization due Pneumococcal shot today. - Pneumococcal conjugate vaccine 20-valent (Prevnar 20)

## 2023-12-07 ENCOUNTER — Telehealth: Payer: Self-pay | Admitting: Family Medicine

## 2023-12-07 ENCOUNTER — Other Ambulatory Visit: Payer: Self-pay | Admitting: Family Medicine

## 2023-12-07 MED ORDER — DOXYCYCLINE HYCLATE 100 MG PO TABS: 100.0000 mg | ORAL_TABLET | Freq: Two times a day (BID) | ORAL | 0 refills | Status: DC

## 2023-12-07 NOTE — Telephone Encounter (Signed)
 Copied from CRM [id number]. Topic: Clinical - Medication Question >> Dec 07, 2023 10:39 AM Hamp Levine R wrote: Reason for CRM: Patient was seen in the office yesterday and was advised would be prescribed oxycycline for the nose infection. Pharmacy has not received the prescription. Calling to verify medication request was sent.  Patient can be reached at [phone]

## 2023-12-07 NOTE — Telephone Encounter (Signed)
 Nurses-medication was sent to [facility] pharmacy [address] today doxycycline  100 mg twice daily take with a snack and a tall glass of water 

## 2023-12-09 ENCOUNTER — Encounter: Payer: Self-pay | Admitting: General Surgery

## 2023-12-09 ENCOUNTER — Ambulatory Visit (INDEPENDENT_AMBULATORY_CARE_PROVIDER_SITE_OTHER): Admitting: General Surgery

## 2023-12-09 VITALS — BP 108/71 | HR 80 | Temp 98.3°F | Resp 14 | Ht 61.0 in | Wt 124.0 lb

## 2023-12-09 DIAGNOSIS — L723 Sebaceous cyst: Secondary | ICD-10-CM

## 2023-12-09 NOTE — Progress Notes (Signed)
 [facility]  No major issues. Pathology with inclusion cyst.   BP 108/71   Pulse 80   Temp 98.3 F (36.8 C) (Oral)   Resp 14   Ht 5' 1 (1.549 m)   Wt 124 lb (56.2 kg)   SpO2 95%   BMI 23.43 kg/m  Sutures removed, no erythema or drainage Steri strips placed  Patient s/p excision of 2 cysts.   Steristrips will peel off in the next 5-7 days. You can remove them once they are peeling off. It is ok to shower. Pat the area dry.   PRN follow up  Deena Farrier, MD [facility] [address] [place], [place] [address] [phone] (office)

## 2023-12-09 NOTE — Patient Instructions (Signed)
 Steristrips will peel off in the next 5-7 days. You can remove them once they are peeling off. It is ok to shower. Pat the area dry.

## 2024-01-16 ENCOUNTER — Other Ambulatory Visit: Payer: Self-pay

## 2024-01-16 ENCOUNTER — Encounter: Payer: Self-pay | Admitting: Family Medicine

## 2024-01-16 ENCOUNTER — Encounter: Payer: Self-pay | Admitting: Emergency Medicine

## 2024-01-16 ENCOUNTER — Ambulatory Visit
Admission: EM | Admit: 2024-01-16 | Discharge: 2024-01-16 | Disposition: A | Discharge status: Home / Self Care | Attending: Family Medicine | Admitting: Family Medicine

## 2024-01-16 DIAGNOSIS — L02512 Cutaneous abscess of left hand: Secondary | ICD-10-CM | POA: Diagnosis not present

## 2024-01-16 DIAGNOSIS — Z23 Encounter for immunization: Secondary | ICD-10-CM | POA: Diagnosis not present

## 2024-01-16 MED ORDER — CHLORHEXIDINE GLUCONATE 4 % EX SOLN: Freq: Every day | CUTANEOUS | 0 refills | Status: DC | PRN

## 2024-01-16 MED ORDER — DOXYCYCLINE HYCLATE 100 MG PO TABS: 100.0000 mg | ORAL_TABLET | Freq: Two times a day (BID) | ORAL | 0 refills | Status: DC

## 2024-01-16 MED ORDER — MUPIROCIN 2 % EX OINT
1.0000 | TOPICAL_OINTMENT | Freq: Two times a day (BID) | CUTANEOUS | 0 refills | Status: DC
Start: 2024-01-16 — End: 2024-03-15

## 2024-01-16 MED ORDER — TETANUS-DIPHTH-ACELL PERTUSSIS 5-2.5-18.5 LF-MCG/0.5 IM SUSY
0.5000 mL | PREFILLED_SYRINGE | Freq: Once | INTRAMUSCULAR | Status: AC
  Administered 2024-01-16: 0.5 mL via INTRAMUSCULAR

## 2024-01-16 MED ORDER — CEFTRIAXONE SODIUM 500 MG IJ SOLR
500.0000 mg | INTRAMUSCULAR | Status: DC
  Administered 2024-01-16: 500 mg via INTRAMUSCULAR

## 2024-01-16 NOTE — ED Triage Notes (Signed)
 Pt reports cut my finger with a steak knife x2 weeks. Pt reports managed site with antibiotic ointment and cleaning solutions and reports it got better until last night. Reports increased pain/redness and limited ROM in left thumb.

## 2024-01-16 NOTE — Discharge Instructions (Signed)
 Take the full course of antibiotics, clean the area at least once a day with Hibiclens  and apply the mupirocin  ointment and a nonstick dressing until fully resolved.  You may do warm Epsom salt soaks, elevate at rest to help with swelling and follow-up for worsening or unresolving symptoms

## 2024-01-16 NOTE — ED Provider Notes (Signed)
 [facility]    CSN: [id number] Arrival date & time: 01/16/24  0807      History   Chief Complaint Chief Complaint  Patient presents with   Finger Injury    HPI Peggy Franklin is a 53 y.o. female.   Patient presenting today with progressively worsening pain, swelling to the left thumb over the last few days.  She states she cut the thumb in a V shape with a serrated steak knife about 2 weeks ago, had been managing the area with antibiotic ointment and wound cleaner and it was getting better up until she went to the beach and swam in the ocean several days ago.  She since has had worsening redness, swelling and now decreased range of motion in the thumb due to swelling and pain.  Denies fever, chills, bleeding or drainage, vomiting.  Trying over-the-counter pain relievers with minimal relief.  Last tetanus shot was in 2018.    Past Medical History:  Diagnosis Date   Cancer (HCC)    Diverticulitis    GERD (gastroesophageal reflux disease)    History of asplenia 07/10/2019   Hyperlipidemia 04/03/2022    Patient Active Problem List   Diagnosis Date Noted   Furuncle of umbilicus 06/24/2023   Cellulitis of umbilicus 05/11/2023   Open wound of umbilical region 04/18/2023   Depression with anxiety 05/08/2022   PTSD (post-traumatic stress disorder) 05/08/2022   Hyperlipidemia 04/03/2022   VIN III (vulvar intraepithelial neoplasia III)    Sebaceous cyst    Lichen planus 01/21/2022   Vesicle of skin 01/21/2022   Aortic atherosclerosis (HCC) 01/01/2022   Smoker 01/01/2022   Sore throat 09/24/2020   Non-recurrent acute suppurative otitis media of right ear without spontaneous rupture of tympanic membrane 09/24/2020   Seasonal allergic rhinitis due to pollen 09/24/2020   COVID-19 virus infection 07/12/2020   Close exposure to 2019 novel coronavirus 07/10/2020   Diarrhea 05/30/2020   Cough 02/05/2020   Head congestion 02/05/2020   History of asplenia 07/10/2019    Genital warts 04/08/2018   Anxiety 04/08/2018   Anal skin tag 03/28/2015   Gastroesophageal reflux disease without esophagitis 03/07/2015   Postsurgical dumping syndrome 01/20/2013    Past Surgical History:  Procedure Laterality Date   ABDOMINAL HYSTERECTOMY     COLON SURGERY     EXCISION VAGINAL CYST Left 03/11/2022   Procedure: EXCISION SEBACEOUS CYST;  Surgeon: Jayne Vonn DEL, MD;  Location: [facility];  Service: Gynecology;  Laterality: Left;   INCISION AND DRAINAGE ABSCESS N/A 06/24/2023   Procedure: REMOVAL FOREIGN BODY ABDOMINAL;  Surgeon: Kallie Manuelita BROCKS, MD;  Location: [facility];  Service: General;  Laterality: N/A;   LAPAROSCOPIC BILATERAL SALPINGO OOPHERECTOMY Bilateral    REMOVAL OF GASTROINTESTINAL STOMATIC  TUMOR OF STOMACH     SMALL INTESTINE SURGERY     SPLENECTOMY, TOTAL     VULVAR LESION REMOVAL N/A 03/11/2022   Procedure: VULVAR BIOPSY WITH LASER ABLATION OF VULVAR;  Surgeon: Jayne Vonn DEL, MD;  Location: [facility];  Service: Gynecology;  Laterality: N/A;  pt knows to arrive at 9:00    OB History     Gravida  3   Para  3   Term  2   Preterm  1   AB      Living  2      SAB      IAB      Ectopic      Multiple      Live Births  1            Home Medications    Prior to Admission medications   Medication Sig Start Date End Date Taking? Authorizing Provider  chlorhexidine  (HIBICLENS ) 4 % external liquid Apply topically daily as needed. 01/16/24  Yes Stuart Vernell Norris, PA-C  mupirocin  ointment (BACTROBAN ) 2 % Apply 1 Application topically 2 (two) times daily. 01/16/24  Yes Stuart Vernell Norris, PA-C  doxycycline  (VIBRA -TABS) 100 MG tablet Take 1 tablet (100 mg total) by mouth 2 (two) times daily. 01/16/24   Stuart Vernell Norris, PA-C  pantoprazole  (PROTONIX ) 40 MG tablet TAKE 1 TABLET(40 MG) BY MOUTH DAILY 04/07/23   Alphonsa Glendia LABOR, MD    Family History Family History  Problem Relation Age of Onset   Emphysema Paternal Grandfather     Stroke Paternal Grandmother    Cancer Maternal Grandmother        renal and uterine cancer   Heart attack Maternal Grandmother    Other Maternal Grandfather        accident   Cancer Mother 63       pancreatic cancer   Other Sister        house fire   Heart attack Sister    Heart attack Daughter     Social History Social History   Tobacco Use   Smoking status: Every Day    Current packs/day: 0.50    Average packs/day: 0.5 packs/day for 10.8 years (5.4 ttl pk-yrs)    Types: Cigarettes    Start date: 04/06/2013   Smokeless tobacco: Never   Tobacco comments:    Smokes E cig  Vaping Use   Vaping status: Never Used  Substance Use Topics   Alcohol use: No   Drug use: No     Allergies   Other   Review of Systems Review of Systems Per HPI  Physical Exam Triage Vital Signs ED Triage Vitals [01/16/24 0816]  Encounter Vitals Group     BP 129/83     Girls Systolic BP Percentile      Girls Diastolic BP Percentile      Boys Systolic BP Percentile      Boys Diastolic BP Percentile      Pulse Rate 69     Resp 20     Temp 98.2 F (36.8 C)     Temp Source Oral     SpO2 96 %     Weight      Height      Head Circumference      Peak Flow      Pain Score 7     Pain Loc      Pain Education      Exclude from Growth Chart    No data found.  Updated Vital Signs BP 129/83 (BP Location: Right Arm)   Pulse 69   Temp 98.2 F (36.8 C) (Oral)   Resp 20   SpO2 96%   Visual Acuity Right Eye Distance:   Left Eye Distance:   Bilateral Distance:    Right Eye Near:   Left Eye Near:    Bilateral Near:     Physical Exam Vitals and nursing note reviewed.  Constitutional:      Appearance: Normal appearance. She is not ill-appearing.  HENT:     Head: Atraumatic.  Eyes:     Extraocular Movements: Extraocular movements intact.     Conjunctiva/sclera: Conjunctivae normal.  Cardiovascular:     Rate and Rhythm: Normal rate and regular  rhythm.     Heart sounds: Normal  heart sounds.  Pulmonary:     Effort: Pulmonary effort is normal.     Breath sounds: Normal breath sounds.  Musculoskeletal:        General: Normal range of motion.     Cervical back: Normal range of motion and neck supple.  Skin:    General: Skin is warm and dry.     Comments: 1 cm area of purulent fluctuant abscess present to the palmar aspect of the left thumb over interphalangeal joint.  No active bleeding or drainage.  Trace edema throughout the finger.  Range of motion limited due to pain and swelling  Neurological:     Mental Status: She is alert and oriented to person, place, and time.     Motor: No weakness.     Gait: Gait normal.     Comments: Left upper extremity neurovascularly intact  Psychiatric:        Mood and Affect: Mood normal.        Thought Content: Thought content normal.        Judgment: Judgment normal.      UC Treatments / Results  Labs (all labs ordered are listed, but only abnormal results are displayed) Labs Reviewed - No data to display  EKG   Radiology No results found.  Procedures Procedures (including critical care time)  Medications Ordered in UC Medications  cefTRIAXone  (ROCEPHIN ) injection 500 mg (500 mg Intramuscular Given 01/16/24 0857)  Tdap (BOOSTRIX) injection 0.5 mL (0.5 mLs Intramuscular Given 01/16/24 0857)    Initial Impression / Assessment and Plan / UC Course  I have reviewed the triage vital signs and the nursing notes.  Pertinent labs & imaging results that were available during my care of the patient were reviewed by me and considered in my medical decision making (see chart for details).     Abscess secondary to infected laceration.  Area was thoroughly cleaned with Hibiclens , cold spray placed for anesthetic and 18-gauge needle was utilized to drain is much purulent drainage as possible from the area.  Patient tolerated procedure well.  Will start IM Rocephin , update Tdap and treat with doxycycline , Hibiclens ,  mupirocin  and good home wound care.  Return for worsening symptoms.  Final Clinical Impressions(s) / UC Diagnoses   Final diagnoses:  Abscess of left thumb     Discharge Instructions      Take the full course of antibiotics, clean the area at least once a day with Hibiclens  and apply the mupirocin  ointment and a nonstick dressing until fully resolved.  You may do warm Epsom salt soaks, elevate at rest to help with swelling and follow-up for worsening or unresolving symptoms    ED Prescriptions     Medication Sig Dispense Auth. Provider   doxycycline  (VIBRA -TABS) 100 MG tablet Take 1 tablet (100 mg total) by mouth 2 (two) times daily. 20 tablet Stuart Vernell Norris, PA-C   chlorhexidine  (HIBICLENS ) 4 % external liquid Apply topically daily as needed. 236 mL Stuart Vernell Norris, PA-C   mupirocin  ointment (BACTROBAN ) 2 % Apply 1 Application topically 2 (two) times daily. 60 g Stuart Vernell Norris, [place]      PDMP not reviewed this encounter.   Stuart Vernell Blox[place], [place] 01/16/24 [phone]

## 2024-01-17 ENCOUNTER — Ambulatory Visit (INDEPENDENT_AMBULATORY_CARE_PROVIDER_SITE_OTHER): Admitting: Podiatry

## 2024-01-17 ENCOUNTER — Encounter: Payer: Self-pay | Admitting: Podiatry

## 2024-01-17 VITALS — Ht 61.0 in | Wt 124.0 lb

## 2024-01-17 DIAGNOSIS — D2372 Other benign neoplasm of skin of left lower limb, including hip: Secondary | ICD-10-CM | POA: Diagnosis not present

## 2024-01-17 NOTE — Progress Notes (Signed)
   Chief Complaint  Patient presents with   Plantar Warts    Pt is here due to wart on the right foot, she states she has been using cream prescribe and that at first it was working well and now the spot has harden back up.    Subjective: 53 y.o. female presenting to the office today for evaluation of pain and tenderness associated to a symptomatic skin lesion to the plantar aspect of the left foot.   Past Medical History:  Diagnosis Date   Cancer [facility])    Diverticulitis    GERD (gastroesophageal reflux disease)    History of asplenia 07/10/2019   Hyperlipidemia 04/03/2022    Past Surgical History:  Procedure Laterality Date   ABDOMINAL HYSTERECTOMY     COLON SURGERY     EXCISION VAGINAL CYST Left 03/11/2022   Procedure: EXCISION SEBACEOUS CYST;  Surgeon: Jayne Vonn DEL, MD;  Location: [facility];  Service: Gynecology;  Laterality: Left;   INCISION AND DRAINAGE ABSCESS N/A 06/24/2023   Procedure: REMOVAL FOREIGN BODY ABDOMINAL;  Surgeon: Kallie Manuelita BROCKS, MD;  Location: [facility];  Service: General;  Laterality: N/A;   LAPAROSCOPIC BILATERAL SALPINGO OOPHERECTOMY Bilateral    REMOVAL OF GASTROINTESTINAL STOMATIC  TUMOR OF STOMACH     SMALL INTESTINE SURGERY     SPLENECTOMY, TOTAL     VULVAR LESION REMOVAL N/A 03/11/2022   Procedure: VULVAR BIOPSY WITH LASER ABLATION OF VULVAR;  Surgeon: Jayne Vonn DEL, MD;  Location: [facility];  Service: Gynecology;  Laterality: N/A;  pt knows to arrive at 9:00    Allergies  Allergen Reactions   Other Hives    Steroids      Objective:  Physical Exam General: Alert and oriented x3 in no acute distress  Dermatology: Hyperkeratotic lesion(s) present on the plantar aspect of the left foot. Pain on palpation with a central nucleated core noted. Skin is warm, dry and supple bilateral lower extremities. Negative for open lesions or macerations.  Vascular: Palpable pedal pulses bilaterally. No edema or erythema noted. Capillary refill within normal  limits.  Neurological: Grossly intact via light touch  Musculoskeletal Exam: Pain on palpation at the keratotic lesion(s) noted. Range of motion within normal limits bilateral. Muscle strength 5/5 in all groups bilateral.  Assessment: 1.  Symptomatic benign skin lesion   Plan of Care:  -Patient evaluated -Excisional debridement of keratoic lesion(s) using a chisel blade was performed without incident.  -Salicylic acid applied with a bandaid -Return to the clinic PRN.   Thresa EMERSON Sar, DPM [facility]  Dr. Thresa EMERSON Sar, DPM    [address] [place], [place] [address]                Office [phone]  Fax [phone]

## 2024-01-17 NOTE — Telephone Encounter (Signed)
 Recommend office visit this week with 1 of us  providers

## 2024-01-25 NOTE — Telephone Encounter (Signed)
 Needs office visit due to constipation.

## 2024-02-01 ENCOUNTER — Telehealth: Payer: Self-pay | Admitting: Internal Medicine

## 2024-02-01 NOTE — Telephone Encounter (Signed)
 Left a message that we received colonoscopy questionnaire and due to constipation the providers want her to have an OV before scheduling procedure.

## 2024-02-01 NOTE — Telephone Encounter (Signed)
 Left patient a message that I was trying to schedule an OV before procedure due to constipation.

## 2024-02-11 ENCOUNTER — Ambulatory Visit: Payer: Self-pay | Admitting: *Deleted

## 2024-02-11 ENCOUNTER — Other Ambulatory Visit: Payer: Self-pay

## 2024-02-11 DIAGNOSIS — J3489 Other specified disorders of nose and nasal sinuses: Secondary | ICD-10-CM

## 2024-02-11 NOTE — Telephone Encounter (Signed)
 Pt states she is not having any symptoms like fever or chills, some pressure on the left side of the nose.

## 2024-02-11 NOTE — Telephone Encounter (Signed)
 As long as it is not causing fever chills or any worrisome symptoms that would need to be seen today she may have a referral to ENT

## 2024-02-11 NOTE — Telephone Encounter (Signed)
 Nurses Need to find out what is going on with the patient Where is the mass?  Typically we have to be able to see the patient in order to do a referral Please get the details thank you

## 2024-02-11 NOTE — Telephone Encounter (Signed)
 FYI Only or Action Required?: Action required by provider: requesting referral .  Patient was last seen in primary care on 12/06/2023 by Alphonsa Glendia LABOR, MD.  Called Nurse Triage reporting Mass.  Symptoms began several days ago.  Interventions attempted: Nothing.  Symptoms are: rapidly worsening.  Triage Disposition: See Physician Within 24 Hours  Patient/caregiver understands and will follow disposition?: No, wishes to speak with PCP               Copied from CRM #[id number]. Topic: Clinical - Red Word Triage >> Feb 11, 2024  9:45 AM Charlet HERO wrote: Red Word that prompted transfer to Nurse Triage: Patient is calling about nose on the left side it is tender to touch and swollen on the inside feels like she got hit in the face with a ball. She is stating that the knot on the inside of her nose is the size of pencil eraser. Reason for Disposition  [1] Swelling is painful to touch AND [2] no fever  Answer Assessment - Initial Assessment Questions Patient requesting referral to ENT in  her location. Offered appt for Monday and patient declined only want ENT referral to check in nose. Please advise.      1. APPEARANCE of SWELLING: What does it look like?     Red mass in left nostril 2. SIZE: How large is the swelling? (e.g., inches, cm; or compare to size of pinhead, tip of pen, eraser, coin, pea, grape, ping pong ball)      Size of eraser 3. LOCATION: Where is the swelling located?     Inside left nostril 4. ONSET: When did the swelling start?     4-5 days ago  tenderness started yesterday  5. COLOR: What color is it? Is there more than one color?     red 6. PAIN: Is there any pain? If Yes, ask: How bad is the pain? (Scale 1-10; or mild, moderate, severe)       Yes pain tenderness, feels like got hit in the face with a ball.  7. ITCH: Does it itch? If Yes, ask: How bad is the itch?      na 8. CAUSE: What do you think caused the swelling?      Not sure  9 OTHER SYMPTOMS: Do you have any other symptoms? (e.g., fever)     Pain inside nose left side.  Protocols used: Skin Lump or Localized Swelling-A-AH

## 2024-02-24 ENCOUNTER — Encounter: Payer: Self-pay | Admitting: Obstetrics & Gynecology

## 2024-02-24 ENCOUNTER — Ambulatory Visit: Admitting: Obstetrics & Gynecology

## 2024-02-24 VITALS — BP 107/66 | HR 77 | Ht 61.0 in | Wt 125.0 lb

## 2024-02-24 DIAGNOSIS — D071 Carcinoma in situ of vulva: Secondary | ICD-10-CM | POA: Diagnosis not present

## 2024-02-24 NOTE — Progress Notes (Signed)
 Chief Complaint  Patient presents with   Follow-up      52 y.o. [id number] No LMP recorded. Patient has had a hysterectomy. The current method of family planning is status post hysterectomy.  Outpatient Encounter Medications as of 02/24/2024  Medication Sig   pantoprazole  (PROTONIX ) 40 MG tablet TAKE 1 TABLET(40 MG) BY MOUTH DAILY   chlorhexidine  (HIBICLENS ) 4 % external liquid Apply topically daily as needed.   doxycycline  (VIBRA -TABS) 100 MG tablet Take 1 tablet (100 mg total) by mouth 2 (two) times daily.   mupirocin  ointment (BACTROBAN ) 2 % Apply 1 Application topically 2 (two) times daily.   No facility-administered encounter medications on file as of 02/24/2024.    Subjective Pt in for re evaluation post op from her laser ablation left VINIII No complaints Past Medical History:  Diagnosis Date   Cancer (HCC)    Diverticulitis    GERD (gastroesophageal reflux disease)    History of asplenia 07/10/2019   Hyperlipidemia 04/03/2022    Past Surgical History:  Procedure Laterality Date   ABDOMINAL HYSTERECTOMY     COLON SURGERY     EXCISION VAGINAL CYST Left 03/11/2022   Procedure: EXCISION SEBACEOUS CYST;  Surgeon: Jayne Vonn DEL, MD;  Location: [facility];  Service: Gynecology;  Laterality: Left;   INCISION AND DRAINAGE ABSCESS N/A 06/24/2023   Procedure: REMOVAL FOREIGN BODY ABDOMINAL;  Surgeon: Kallie Manuelita BROCKS, MD;  Location: [facility];  Service: General;  Laterality: N/A;   LAPAROSCOPIC BILATERAL SALPINGO OOPHERECTOMY Bilateral    REMOVAL OF GASTROINTESTINAL STOMATIC  TUMOR OF STOMACH     SMALL INTESTINE SURGERY     SPLENECTOMY, TOTAL     VULVAR LESION REMOVAL N/A 03/11/2022   Procedure: VULVAR BIOPSY WITH LASER ABLATION OF VULVAR;  Surgeon: Jayne Vonn DEL, MD;  Location: [facility];  Service: Gynecology;  Laterality: N/A;  pt knows to arrive at 9:00    OB History     Gravida  3   Para  3   Term  2   Preterm  1   AB      Living  2      SAB      IAB       Ectopic      Multiple      Live Births  1           Allergies  Allergen Reactions   Other Hives    Steroids     Social History   Socioeconomic History   Marital status: Media planner    Spouse name: Not on file   Number of children: Not on file   Years of education: Not on file   Highest education level: Not on file  Occupational History   Not on file  Tobacco Use   Smoking status: Every Day    Current packs/day: 0.50    Average packs/day: 0.5 packs/day for 10.9 years (5.4 ttl pk-yrs)    Types: Cigarettes    Start date: 04/06/2013   Smokeless tobacco: Never   Tobacco comments:    Smokes E cig  Vaping Use   Vaping status: Never Used  Substance and Sexual Activity   Alcohol use: No   Drug use: No   Sexual activity: Yes    Birth control/protection: Surgical    Comment: hyst  Other Topics Concern   Not on file  Social History Narrative   Not on file   Social Drivers of Health   Financial Resource Strain:  Not on file  Food Insecurity: Not on file  Transportation Needs: Not on file  Physical Activity: Not on file  Stress: Not on file  Social Connections: Not on file    Family History  Problem Relation Age of Onset   Emphysema Paternal Grandfather    Stroke Paternal Grandmother    Cancer Maternal Grandmother        renal and uterine cancer   Heart attack Maternal Grandmother    Other Maternal Grandfather        accident   Cancer Mother 58       pancreatic cancer   Other Sister        house fire   Heart attack Sister    Heart attack Daughter     Medications:       Current Outpatient Medications:    pantoprazole  (PROTONIX ) 40 MG tablet, TAKE 1 TABLET(40 MG) BY MOUTH DAILY, Disp: 90 tablet, Rfl: 3   chlorhexidine  (HIBICLENS ) 4 % external liquid, Apply topically daily as needed., Disp: 236 mL, Rfl: 0   doxycycline  (VIBRA -TABS) 100 MG tablet, Take 1 tablet (100 mg total) by mouth 2 (two) times daily., Disp: 20 tablet, Rfl: 0   mupirocin   ointment (BACTROBAN ) 2 %, Apply 1 Application topically 2 (two) times daily., Disp: 60 g, Rfl: 0  Objective Blood pressure 107/66, pulse 77, height 5' 1 (1.549 m), weight 125 lb (56.7 kg).  Left vulva is well healed No evidence of any other lesions are seen today  Pertinent ROS No burning with urination, frequency or urgency No nausea, vomiting or diarrhea Nor fever chills or other constitutional symptoms   Labs or studies     Impression + Management Plan: Diagnoses this Encounter::   ICD-10-CM   1. VIN III (vulvar intraepithelial neoplasia III), multifocal s/p laser ablative surgery: NED  D07.1    follow up 1 year        Medications prescribed during  this encounter: No orders of the defined types were placed in this encounter.   Labs or Scans Ordered during this encounter: No orders of the defined types were placed in this encounter.     Follow up Return in about 1 year (around 02/23/2025).

## 2024-03-04 ENCOUNTER — Encounter: Payer: Self-pay | Admitting: Nurse Practitioner

## 2024-03-06 NOTE — Telephone Encounter (Signed)
 Message put in patient's chart and sent to provider

## 2024-03-08 ENCOUNTER — Ambulatory Visit: Admitting: Internal Medicine

## 2024-03-15 ENCOUNTER — Ambulatory Visit: Admitting: Family Medicine

## 2024-03-15 ENCOUNTER — Ambulatory Visit (HOSPITAL_COMMUNITY)
Admission: RE | Admit: 2024-03-15 | Discharge: 2024-03-15 | Disposition: A | Discharge status: Home / Self Care | Source: Ambulatory Visit | Attending: Internal Medicine | Admitting: Internal Medicine

## 2024-03-15 ENCOUNTER — Encounter: Payer: Self-pay | Admitting: Internal Medicine

## 2024-03-15 ENCOUNTER — Other Ambulatory Visit: Payer: Self-pay | Admitting: *Deleted

## 2024-03-15 ENCOUNTER — Ambulatory Visit (INDEPENDENT_AMBULATORY_CARE_PROVIDER_SITE_OTHER): Admitting: Internal Medicine

## 2024-03-15 VITALS — BP 104/58 | HR 79 | Temp 97.1°F | Ht 61.0 in | Wt 122.8 lb

## 2024-03-15 VITALS — BP 121/77 | HR 78 | Temp 97.5°F | Ht 61.0 in | Wt 121.0 lb

## 2024-03-15 DIAGNOSIS — K5909 Other constipation: Secondary | ICD-10-CM | POA: Diagnosis not present

## 2024-03-15 DIAGNOSIS — K219 Gastro-esophageal reflux disease without esophagitis: Secondary | ICD-10-CM | POA: Diagnosis not present

## 2024-03-15 DIAGNOSIS — R10824 Left lower quadrant rebound abdominal tenderness: Secondary | ICD-10-CM

## 2024-03-15 DIAGNOSIS — R1032 Left lower quadrant pain: Secondary | ICD-10-CM | POA: Insufficient documentation

## 2024-03-15 DIAGNOSIS — K5792 Diverticulitis of intestine, part unspecified, without perforation or abscess without bleeding: Secondary | ICD-10-CM | POA: Diagnosis not present

## 2024-03-15 DIAGNOSIS — K5904 Chronic idiopathic constipation: Secondary | ICD-10-CM

## 2024-03-15 MED ORDER — IOHEXOL 9 MG/ML PO SOLN
ORAL | Status: AC
  Filled 2024-03-15: qty 1000

## 2024-03-15 MED ORDER — HYDROCODONE-ACETAMINOPHEN 5-325 MG PO TABS: 1.0000 | ORAL_TABLET | Freq: Four times a day (QID) | ORAL | 0 refills | Status: DC | PRN

## 2024-03-15 MED ORDER — IOHEXOL 300 MG/ML  SOLN
80.0000 mL | Freq: Once | INTRAMUSCULAR | Status: AC | PRN
  Administered 2024-03-15: 80 mL via INTRAVENOUS

## 2024-03-15 MED ORDER — METRONIDAZOLE 500 MG PO TABS: 500.0000 mg | ORAL_TABLET | Freq: Three times a day (TID) | ORAL | 0 refills | Status: AC

## 2024-03-15 MED ORDER — IOHEXOL 9 MG/ML PO SOLN
500.0000 mL | ORAL | Status: AC
  Administered 2024-03-15: 500 mL via ORAL

## 2024-03-15 MED ORDER — CIPROFLOXACIN HCL 500 MG PO TABS: 500.0000 mg | ORAL_TABLET | Freq: Two times a day (BID) | ORAL | 0 refills | Status: AC

## 2024-03-15 MED ORDER — FLUCONAZOLE 150 MG PO TABS
150.0000 mg | ORAL_TABLET | Freq: Once | ORAL | 4 refills | Status: AC
Start: 2024-03-15 — End: 2024-03-15

## 2024-03-15 MED ORDER — HYDROCODONE-ACETAMINOPHEN 5-325 MG PO TABS: 1.0000 | ORAL_TABLET | Freq: Four times a day (QID) | ORAL | 0 refills | Status: AC | PRN

## 2024-03-15 NOTE — Progress Notes (Signed)
 CPT Code 25822 Description: CT ABDOMEN & PELVIS W/ Authorization Number: [id number] Case Number: [id number] Review Date: 03/15/2024 11:45:20 AM Expiration Date: 04/29/2024 Status: Your case has been Approved.

## 2024-03-15 NOTE — Progress Notes (Signed)
   Subjective:    Patient ID: Peggy Franklin, female    DOB: 1970/12/04, 53 y.o.   MRN: [id number]  HPI  CT results are in  LLQ abd pain Several days of lower abdominal pain now worse Saw gastroenterology for different issue and they ordered a CT scan She is here today to discuss CAT scan recently completed shows diverticulitis Patient denies high fever chills sweats vomiting relates significant pain discomfort left lower quadrant Review of Systems     Objective:   Physical Exam Lungs are clear hearts regular abdomen is soft with tenderness in the left lower quadrant       Assessment & Plan:  Diverticulitis We talked in detail about importance of taking the antibiotics as directed if she feels she has any setbacks to let us  know Hydrocodone  for severe pain caution drowsiness home use only Warning signs regarding abscess worsening condition ER visits and hospitalization discussed in detail

## 2024-03-15 NOTE — Progress Notes (Signed)
 Primary Care Physician:  Alphonsa Glendia LABOR, MD Primary Gastroenterologist:  Dr. Cindie  Chief Complaint  Patient presents with   New Patient (Initial Visit)    Patient here today as a new patient in need of a colonoscopy. Patient is having some issues with constipation. Patient has a history of Constipation, and says this has been an ongoing issue for a while. 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 Surgery was done at [facility]. Patient does not take any stools softener or anti constipation medication. She is having some issues with left lower abdominal pain and radiates to the left. She says the pain is a 9 out of 10.    HPI:   Peggy Franklin is a 53 y.o. female who presents to the clinic by referral from her PCP Dr. Alphonsa for evaluation.  Left lower quadrant abdominal pain: Has had intermittent flares of abdominal pain.  Most recent flare started 2 days ago.  9 out of 10 abdominal pain.  Primarily left lower quadrant radiating to the right lower quadrant.  No melena hematochezia.  Similar episode 06/17/2023, CT abdomen pelvis which I personally reviewed showed thickening of the sigmoid colon, significant diverticulosis.  Was treated with antibiotics.  No prior colonoscopy.  Does note significant constipation having a bowel movement every 1 to 2 days.  Endorses hard stools with associated straining.  Has tried over-the-counter stool softeners which help though she stopped taking these as she did not want to get addicted to them.  Has chronic acid reflux which is well-controlled on pantoprazole .  No dysphagia odynophagia.  No epigastric or chest pain.  No chronic NSAID use.   Past Medical History:  Diagnosis Date   Cancer [facility])    Diverticulitis    GERD (gastroesophageal reflux disease)    History of asplenia 07/10/2019   Hyperlipidemia 04/03/2022    Past Surgical History:  Procedure Laterality Date   ABDOMINAL HYSTERECTOMY     COLON  SURGERY     EXCISION VAGINAL CYST Left 03/11/2022   Procedure: EXCISION SEBACEOUS CYST;  Surgeon: Jayne Vonn DEL, MD;  Location: [facility];  Service: Gynecology;  Laterality: Left;   INCISION AND DRAINAGE ABSCESS N/A 06/24/2023   Procedure: REMOVAL FOREIGN BODY ABDOMINAL;  Surgeon: Kallie Manuelita BROCKS, MD;  Location: [facility];  Service: General;  Laterality: N/A;   LAPAROSCOPIC BILATERAL SALPINGO OOPHERECTOMY Bilateral    REMOVAL OF GASTROINTESTINAL STOMATIC  TUMOR OF STOMACH     SMALL INTESTINE SURGERY     SPLENECTOMY, TOTAL     VULVAR LESION REMOVAL N/A 03/11/2022   Procedure: VULVAR BIOPSY WITH LASER ABLATION OF VULVAR;  Surgeon: Jayne Vonn DEL, MD;  Location: [facility];  Service: Gynecology;  Laterality: N/A;  pt knows to arrive at 9:00    Current Outpatient Medications  Medication Sig Dispense Refill   pantoprazole  (PROTONIX ) 40 MG tablet TAKE 1 TABLET(40 MG) BY MOUTH DAILY 90 tablet 3   No current facility-administered medications for this visit.    Allergies as of 03/15/2024 - Review Complete 03/15/2024  Allergen Reaction Noted   Other Hives 01/20/2013    Family History  Problem Relation Age of Onset   Emphysema Paternal Grandfather    Stroke Paternal Grandmother    Cancer Maternal Grandmother        renal and uterine cancer   Heart attack Maternal Grandmother    Other Maternal Grandfather        accident  Cancer Mother 80       pancreatic cancer   Other Sister        house fire   Heart attack Sister    Heart attack Daughter     Social History   Socioeconomic History   Marital status: Media planner    Spouse name: Not on file   Number of children: Not on file   Years of education: Not on file   Highest education level: Not on file  Occupational History   Not on file  Tobacco Use   Smoking status: Every Day    Current packs/day: 0.50    Average packs/day: 0.5 packs/day for 10.9 years (5.5 ttl pk-yrs)    Types: Cigarettes    Start date: 04/06/2013   Smokeless  tobacco: Never   Tobacco comments:    Smokes E cig  Vaping Use   Vaping status: Never Used  Substance and Sexual Activity   Alcohol use: No   Drug use: No   Sexual activity: Yes    Birth control/protection: Surgical    Comment: hyst  Other Topics Concern   Not on file  Social History Narrative   Not on file   Social Drivers of Health   Financial Resource Strain: Not on file  Food Insecurity: Not on file  Transportation Needs: Not on file  Physical Activity: Not on file  Stress: Not on file  Social Connections: Not on file  Intimate Partner Violence: Not on file    Subjective: Review of Systems  Constitutional:  Negative for chills and fever.  HENT:  Negative for congestion and hearing loss.   Eyes:  Negative for blurred vision and double vision.  Respiratory:  Negative for cough and shortness of breath.   Cardiovascular:  Negative for chest pain and palpitations.  Gastrointestinal:  Positive for abdominal pain. Negative for blood in stool, constipation, diarrhea, heartburn, melena and vomiting.  Genitourinary:  Negative for dysuria and urgency.  Musculoskeletal:  Negative for joint pain and myalgias.  Skin:  Negative for itching and rash.  Neurological:  Negative for dizziness and headaches.  Psychiatric/Behavioral:  Negative for depression. The patient is not nervous/anxious.        Objective: BP (!) 104/58 (BP Location: Left Arm, Patient Position: Sitting, Cuff Size: Normal)   Pulse 79   Temp (!) 97.1 F (36.2 C) (Temporal)   Ht 5' 1 (1.549 m)   Wt 122 lb 12.8 oz (55.7 kg)   BMI 23.20 kg/m  Physical Exam Constitutional:      Appearance: Normal appearance.  HENT:     Head: Normocephalic and atraumatic.  Eyes:     Extraocular Movements: Extraocular movements intact.     Conjunctiva/sclera: Conjunctivae normal.  Cardiovascular:     Rate and Rhythm: Normal rate and regular rhythm.  Pulmonary:     Effort: Pulmonary effort is normal.     Breath sounds:  Normal breath sounds.  Abdominal:     General: Bowel sounds are normal.     Palpations: Abdomen is soft.     Tenderness: There is abdominal tenderness.  Musculoskeletal:        General: No swelling. Normal range of motion.     Cervical back: Normal range of motion and neck supple.  Skin:    General: Skin is warm and dry.     Coloration: Skin is not jaundiced.  Neurological:     General: No focal deficit present.     Mental Status: She is alert and oriented  to person, place, and time.  Psychiatric:        Mood and Affect: Mood normal.        Behavior: Behavior normal.      Assessment: *Left lower quadrant abdominal pain *Left lower quadrant abdominal tenderness *Abnormal CT sigmoid colon (05/2023) *Chronic constipation *Chronic GERD  Plan: Patient quite tender on exam today.  Will order stat CT abdomen pelvis with contrast to be performed today.  Call with results.  She certainly warrants colonoscopy, timing to be determined based on CT results.  Likely 3 to 4 weeks which we will schedule.  The risks including infection, bleed, or perforation as well as benefits, limitations, alternatives and imponderables have been reviewed with the patient. Questions have been answered. All parties agreeable.  Chronic GERD well-controlled on pantoprazole , will continue.  Further recommendations to follow.  03/15/2024 11:28 AM   Disclaimer: This note was dictated with voice recognition software. Similar sounding words can inadvertently be transcribed and may not be corrected upon review.

## 2024-04-12 ENCOUNTER — Telehealth: Payer: Self-pay | Admitting: *Deleted

## 2024-04-12 MED ORDER — SUTAB 1479-225-188 MG PO TABS: ORAL_TABLET | ORAL | 0 refills | Status: DC

## 2024-04-12 NOTE — Telephone Encounter (Signed)
 Per Dr. Cindie okay to send sutab. Instructions sent   PA submitted via [employer]. Pending review  The prior authorization/notification reference number is: [id number].

## 2024-04-12 NOTE — Telephone Encounter (Signed)
 Pt called in to schedule colonoscopy wit Dr. Cindie. Spoke with Dr. Cindie, ok to schedule, ASA 2.  Called pt and she has been scheduled for 11/21. She also requested sutabs as prep. Advised will ask Dr. Cindie and if approved, will send in.

## 2024-04-17 ENCOUNTER — Ambulatory Visit: Admitting: Podiatry

## 2024-04-17 NOTE — Telephone Encounter (Signed)
 PA APPROVED [id number] DOS: 05/12/24-08/10/24

## 2024-04-27 ENCOUNTER — Encounter (INDEPENDENT_AMBULATORY_CARE_PROVIDER_SITE_OTHER): Payer: Self-pay

## 2024-05-04 ENCOUNTER — Other Ambulatory Visit: Payer: Self-pay

## 2024-05-04 ENCOUNTER — Emergency Department (HOSPITAL_COMMUNITY)
Admission: EM | Admit: 2024-05-04 | Discharge: 2024-05-05 | Disposition: A | Discharge status: Home / Self Care | Attending: Emergency Medicine | Admitting: Emergency Medicine

## 2024-05-04 ENCOUNTER — Emergency Department (HOSPITAL_COMMUNITY)

## 2024-05-04 DIAGNOSIS — W25XXXA Contact with sharp glass, initial encounter: Secondary | ICD-10-CM | POA: Insufficient documentation

## 2024-05-04 DIAGNOSIS — S61210A Laceration without foreign body of right index finger without damage to nail, initial encounter: Secondary | ICD-10-CM | POA: Insufficient documentation

## 2024-05-04 DIAGNOSIS — Y93G1 Activity, food preparation and clean up: Secondary | ICD-10-CM | POA: Diagnosis not present

## 2024-05-04 DIAGNOSIS — S6991XA Unspecified injury of right wrist, hand and finger(s), initial encounter: Secondary | ICD-10-CM | POA: Diagnosis present

## 2024-05-04 NOTE — ED Triage Notes (Signed)
 Pt was cleaning sink when broken glass cut right pointer finger 1 hour ago. Pt states she tried to use sugar, bleach and liquid bandaid and laceration would not stop bleeding. Tetanus updated 3 years ago.

## 2024-05-04 NOTE — ED Notes (Signed)
 Pt has laceration to right pointer finger, pt has removed coban and guaze that she initially presented with per pt feels like finger is getting cold. On assessment pt has minimal bleeding, will continue to monitor.

## 2024-05-05 MED ORDER — LIDOCAINE HCL (PF) 1 % IJ SOLN
5.0000 mL | Freq: Once | INTRAMUSCULAR | Status: AC
  Administered 2024-05-05: 5 mL
  Filled 2024-05-05: qty 5

## 2024-05-05 MED ORDER — LIDOCAINE HCL (PF) 2 % IJ SOLN
10.0000 mL | Freq: Once | INTRAMUSCULAR | Status: AC
  Administered 2024-05-05: 10 mL

## 2024-05-05 NOTE — ED Provider Notes (Signed)
 [place] EMERGENCY DEPARTMENT AT [facility] Provider Note   CSN: [id number] Arrival date & time: 05/04/24  2032     Patient presents with: Laceration   Peggy Franklin is a 53 y.o. female.   The history is provided by the patient.  Laceration  She has history of hyperlipidemia, GERD, diverticulitis (chart says patient has asplenia but CT scan of abdomen and pelvis on 03/15/2024 showed normal spleen) and comes in after suffering laceration to her right index finger.  She was washing dishes and cut her finger on a piece of glass.  She denies other injury.  Last tetanus immunization was within the last year.    Prior to Admission medications   Medication Sig Start Date End Date Taking? Authorizing Provider  pantoprazole  (PROTONIX ) 40 MG tablet TAKE 1 TABLET(40 MG) BY MOUTH DAILY 04/07/23   Alphonsa Glendia LABOR, MD  Sodium Sulfate-Mag Sulfate-KCl (SUTAB) [phone] MG TABS As directed 04/12/24   Carver, Charles K, DO    Allergies: Other    Review of Systems  All other systems reviewed and are negative.   Updated Vital Signs BP 126/81 (BP Location: Right Arm)   Pulse 70   Temp 98.2 F (36.8 C)   Resp 20   Ht 5' 1 (1.549 m)   Wt 54.9 kg   SpO2 98%   BMI 22.87 kg/m   Physical Exam Vitals and nursing note reviewed.   53 year old female, resting comfortably and in no acute distress. Vital signs are normal. Oxygen saturation is 98%, which is normal. Head is normocephalic and atraumatic. PERRLA, EOMI.  Lungs are clear without rales, wheezes, or rhonchi. Heart has regular rate and rhythm without murmur. Extremities: Laceration is present on the tip of the right second finger. Skin is warm and dry without rash. Neurologic: Awake and alert, moves all extremities equally.    Radiology: DG Finger Index Right Result Date: 05/04/2024 CLINICAL DATA:  Laceration EXAM: RIGHT INDEX FINGER 2+V COMPARISON:  None Available. FINDINGS: There is no evidence of fracture or  dislocation. There is no evidence of arthropathy or other focal bone abnormality. Soft tissues are unremarkable. No foreign body identified. IMPRESSION: Negative. Electronically Signed   By: Greig Pique M.D.   On: 05/04/2024 21:37     .Laceration Repair  Date/Time: 05/05/2024 1:45 AM  Performed by: Raford Lenis, MD Authorized by: Raford Lenis, MD   Consent:    Consent obtained:  Verbal   Consent given by:  Patient   Risks, benefits, and alternatives were discussed: yes     Risks discussed:  Infection, pain, retained foreign body and poor wound healing   Alternatives discussed:  No treatment Universal protocol:    Procedure explained and questions answered to patient or proxy's satisfaction: yes     Relevant documents present and verified: yes     Test results available: yes     Imaging studies available: yes     Required blood products, implants, devices, and special equipment available: yes     Site/side marked: yes     Immediately prior to procedure, a time out was called: yes     Patient identity confirmed:  Verbally with patient and arm band Anesthesia:    Anesthesia method:  Nerve block and local infiltration   Local anesthetic:  Lidocaine  1% w/o epi   Block location:  Right hand   Block needle gauge:  25 G   Block anesthetic:  Lidocaine  2% w/o epi   Block technique:  Digital block   Block injection procedure:  Anatomic landmarks identified, introduced needle, incremental injection, negative aspiration for blood and anatomic landmarks palpated   Block outcome:  Incomplete block Laceration details:    Location:  Finger   Finger location:  R index finger   Length (cm):  1.5   Depth (mm):  2 Pre-procedure details:    Preparation:  Patient was prepped and draped in usual sterile fashion and imaging obtained to evaluate for foreign bodies Exploration:    Limited defect created (wound extended): no     Hemostasis achieved with:  Direct pressure   Imaging obtained: x-ray      Imaging outcome: foreign body not noted     Wound exploration: entire depth of wound visualized     Wound extent: no foreign body   Treatment:    Area cleansed with:  Saline   Amount of cleaning:  Standard   Debridement:  None   Undermining:  None Skin repair:    Repair method:  Sutures   Suture size:  5-0   Suture material:  Prolene   Suture technique:  Simple interrupted   Number of sutures:  3 Approximation:    Approximation:  Close Repair type:    Repair type:  Simple Post-procedure details:    Dressing:  Adhesive bandage   Procedure completion:  Tolerated well, no immediate complications    Medications Ordered in the ED  lidocaine  HCl (PF) (XYLOCAINE ) 2 % injection 10 mL (10 mLs Infiltration Given 05/05/24 0028)  lidocaine  (PF) (XYLOCAINE ) 1 % injection 5 mL (5 mLs Other Given 05/05/24 0106)                                    Medical Decision Making Amount and/or Complexity of Data Reviewed Radiology: ordered.  Risk Prescription drug management.   Laceration of right second finger.  X-ray showed no evidence of radiopaque foreign body.  Have independently viewed the images, and agree with radiologist's interpretation.  After regional anesthesia is obtained with a digital block, I have closed the laceration with sutures.  I have reviewed her past records, and last tetanus immunization was 01/16/2024.  I am discharging her with instructions to have sutures removed in 7 days.     Final diagnoses:  Laceration of right index finger, initial encounter    ED Discharge Orders     None          Raford Lenis, MD 05/05/24 [phone]

## 2024-05-05 NOTE — ED Notes (Signed)
 Pt left without discharge paperwork instructions/education/vital signs.

## 2024-05-08 ENCOUNTER — Other Ambulatory Visit: Payer: Self-pay

## 2024-05-08 ENCOUNTER — Encounter (HOSPITAL_COMMUNITY)
Admission: RE | Admit: 2024-05-08 | Discharge: 2024-05-08 | Disposition: A | Discharge status: Home / Self Care | Source: Ambulatory Visit | Attending: Internal Medicine | Admitting: Internal Medicine

## 2024-05-08 ENCOUNTER — Telehealth (INDEPENDENT_AMBULATORY_CARE_PROVIDER_SITE_OTHER): Payer: Self-pay | Admitting: *Deleted

## 2024-05-08 ENCOUNTER — Encounter (HOSPITAL_COMMUNITY): Payer: Self-pay

## 2024-05-08 MED ORDER — NA SULFATE-K SULFATE-MG SULF 17.5-3.13-1.6 GM/177ML PO SOLN: 1.0000 | Freq: Once | ORAL | 0 refills | Status: AC

## 2024-05-08 NOTE — Progress Notes (Signed)
 Attempted pre-op call.  Left voicemail for patient to call back.

## 2024-05-08 NOTE — Telephone Encounter (Signed)
 Pt called in. Her sutabs not covered by insurance and needed different prep. Did not want the large volume. Will try to send suprep. Advised will also send instructions via mychart.

## 2024-05-12 ENCOUNTER — Inpatient Hospital Stay: Payer: Self-pay | Admitting: Physician Assistant

## 2024-05-12 ENCOUNTER — Encounter (HOSPITAL_COMMUNITY): Payer: Self-pay | Admitting: Internal Medicine

## 2024-05-12 ENCOUNTER — Encounter (HOSPITAL_COMMUNITY)
Admission: RE | Disposition: A | Discharge status: Home / Self Care | Payer: Self-pay | Source: Home / Self Care | Attending: Internal Medicine

## 2024-05-12 ENCOUNTER — Ambulatory Visit (HOSPITAL_COMMUNITY): Admitting: Certified Registered Nurse Anesthetist

## 2024-05-12 ENCOUNTER — Inpatient Hospital Stay: Payer: Self-pay | Admitting: Family Medicine

## 2024-05-12 ENCOUNTER — Ambulatory Visit (HOSPITAL_COMMUNITY)
Admission: RE | Admit: 2024-05-12 | Discharge: 2024-05-12 | Disposition: A | Discharge status: Home / Self Care | Attending: Internal Medicine | Admitting: Internal Medicine

## 2024-05-12 DIAGNOSIS — K5732 Diverticulitis of large intestine without perforation or abscess without bleeding: Secondary | ICD-10-CM

## 2024-05-12 DIAGNOSIS — K5790 Diverticulosis of intestine, part unspecified, without perforation or abscess without bleeding: Secondary | ICD-10-CM | POA: Insufficient documentation

## 2024-05-12 DIAGNOSIS — K5730 Diverticulosis of large intestine without perforation or abscess without bleeding: Secondary | ICD-10-CM | POA: Diagnosis not present

## 2024-05-12 DIAGNOSIS — D127 Benign neoplasm of rectosigmoid junction: Secondary | ICD-10-CM | POA: Diagnosis not present

## 2024-05-12 DIAGNOSIS — K219 Gastro-esophageal reflux disease without esophagitis: Secondary | ICD-10-CM | POA: Insufficient documentation

## 2024-05-12 DIAGNOSIS — F17210 Nicotine dependence, cigarettes, uncomplicated: Secondary | ICD-10-CM | POA: Insufficient documentation

## 2024-05-12 DIAGNOSIS — K649 Unspecified hemorrhoids: Secondary | ICD-10-CM | POA: Insufficient documentation

## 2024-05-12 DIAGNOSIS — K6389 Other specified diseases of intestine: Secondary | ICD-10-CM

## 2024-05-12 DIAGNOSIS — K644 Residual hemorrhoidal skin tags: Secondary | ICD-10-CM | POA: Diagnosis not present

## 2024-05-12 DIAGNOSIS — D120 Benign neoplasm of cecum: Secondary | ICD-10-CM | POA: Diagnosis not present

## 2024-05-12 DIAGNOSIS — R933 Abnormal findings on diagnostic imaging of other parts of digestive tract: Secondary | ICD-10-CM | POA: Diagnosis present

## 2024-05-12 HISTORY — PX: COLONOSCOPY: SHX5424

## 2024-05-12 SURGERY — COLONOSCOPY
Anesthesia: General

## 2024-05-12 MED ORDER — PROPOFOL 10 MG/ML IV BOLUS
INTRAVENOUS | Status: DC | PRN
  Administered 2024-05-12: 100 mg via INTRAVENOUS
  Administered 2024-05-12: 150 ug/kg/min via INTRAVENOUS

## 2024-05-12 MED ORDER — LACTATED RINGERS IV SOLN
INTRAVENOUS | Status: DC
  Administered 2024-05-12: 1000 mL via INTRAVENOUS

## 2024-05-12 NOTE — Telephone Encounter (Unsigned)
 Copied from CRM #[id number]. Topic: Appointments - Scheduling Inquiry for Clinic >> May 12, 2024  2:06 PM Roselie BROCKS wrote: Reason for CRM: Patient was told by the clinic on a phone call to come in Monday November 24th for a hospital follow up and to remove stitches, I am not able to schedule an hospital follow up for Monday, first available is Dec 3rd. Patient requests a call back to work her in for Monday

## 2024-05-12 NOTE — Discharge Instructions (Addendum)
  Colonoscopy Discharge Instructions  Read the instructions outlined below and refer to this sheet in the next few weeks. These discharge instructions provide you with general information on caring for yourself after you leave the hospital. Your doctor may also give you specific instructions. While your treatment has been planned according to the most current medical practices available, unavoidable complications occasionally occur.   ACTIVITY You may resume your regular activity, but move at a slower pace for the next 24 hours.  Take frequent rest periods for the next 24 hours.  Walking will help get rid of the air and reduce the bloated feeling in your belly (abdomen).  No driving for 24 hours (because of the medicine (anesthesia) used during the test).   Do not sign any important legal documents or operate any machinery for 24 hours (because of the anesthesia used during the test).  NUTRITION Drink plenty of fluids.  You may resume your normal diet as instructed by your doctor.  Begin with a light meal and progress to your normal diet. Heavy or fried foods are harder to digest and may make you feel sick to your stomach (nauseated).  Avoid alcoholic beverages for 24 hours or as instructed.  MEDICATIONS You may resume your normal medications unless your doctor tells you otherwise.  WHAT YOU CAN EXPECT TODAY Some feelings of bloating in the abdomen.  Passage of more gas than usual.  Spotting of blood in your stool or on the toilet paper.  IF YOU HAD POLYPS REMOVED DURING THE COLONOSCOPY: No aspirin products for 7 days or as instructed.  No alcohol for 7 days or as instructed.  Eat a soft diet for the next 24 hours.  FINDING OUT THE RESULTS OF YOUR TEST Not all test results are available during your visit. If your test results are not back during the visit, make an appointment with your caregiver to find out the results. Do not assume everything is normal if you have not heard from your  caregiver or the medical facility. It is important for you to follow up on all of your test results.  SEEK IMMEDIATE MEDICAL ATTENTION IF: You have more than a spotting of blood in your stool.  Your belly is swollen (abdominal distention).  You are nauseated or vomiting.  You have a temperature over 101.  You have abdominal pain or discomfort that is severe or gets worse throughout the day.   .Your colonoscopy revealed 2 polyp(s) which I removed successfully.   You also had diverticulosis in the left side your colon.  There was inflammation in this region.  I took samples. We will call with results.   Recommend high fiber diet. Limit red meat consumption.   Unfortunately, your colon was not adequately prepped today for colonoscopy.  I did not see any evidence of colon cancer or large polyps, but certainly could have missed smaller polyps due to poor visualization.    Would recommend repeat colonoscopy in 6 months with a different colon prep.  Follow up in 2-3 months.    I hope you have a great rest of your week!  Carlin POUR. Cindie, D.O. Gastroenterology and Hepatology [facility]

## 2024-05-12 NOTE — Anesthesia Preprocedure Evaluation (Addendum)
 Anesthesia Evaluation  Patient identified by MRN, date of birth, ID band Patient awake    Reviewed: Allergy & Precautions, H&P , NPO status , Patient's Chart, lab work & pertinent test results, reviewed documented beta blocker date and time   Airway Mallampati: II  TM Distance: >3 FB Neck ROM: full    Dental  (+) Dental Advisory Given, Missing, Poor Dentition,    Pulmonary Current Smoker and Patient abstained from smoking.   Pulmonary exam normal breath sounds clear to auscultation       Cardiovascular hypertension, Normal cardiovascular exam Rhythm:regular Rate:Normal     Neuro/Psych  PSYCHIATRIC DISORDERS Anxiety Depression    negative neurological ROS     GI/Hepatic Neg liver ROS,GERD  ,,  Endo/Other  negative endocrine ROS    Renal/GU negative Renal ROS  negative genitourinary   Musculoskeletal   Abdominal   Peds  Hematology negative hematology ROS (+)   Anesthesia Other Findings cancer  Reproductive/Obstetrics negative OB ROS                              Anesthesia Physical Anesthesia Plan  ASA: 2  Anesthesia Plan: General and General ETT   Post-op Pain Management: Minimal or no pain anticipated   Induction: Intravenous  PONV Risk Score and Plan: Propofol  infusion  Airway Management Planned: Natural Airway and Nasal Cannula  Additional Equipment: None  Intra-op Plan:   Post-operative Plan:   Informed Consent: I have reviewed the patients History and Physical, chart, labs and discussed the procedure including the risks, benefits and alternatives for the proposed anesthesia with the patient or authorized representative who has indicated his/her understanding and acceptance.     Dental Advisory Given  Plan Discussed with: CRNA  Anesthesia Plan Comments:         Anesthesia Quick Evaluation

## 2024-05-12 NOTE — Transfer of Care (Signed)
 Immediate Anesthesia Transfer of Care Note  Patient: Peggy Franklin  Procedure(s) Performed: COLONOSCOPY  Patient Location: Short Stay  Anesthesia Type:General  Level of Consciousness: drowsy  Airway & Oxygen Therapy: Patient Spontanous Breathing  Post-op Assessment: Report given to RN and Post -op Vital signs reviewed and stable  Post vital signs: Reviewed and stable  Last Vitals:  Vitals Value Taken Time  BP 100/54   Temp    Pulse 75 `  Resp 19   SpO2 100%     Last Pain:  Vitals:   05/12/24 0839  TempSrc:   PainSc: 0-No pain      Patients Stated Pain Goal: 8 (05/12/24 0759)  Complications: No notable events documented.

## 2024-05-12 NOTE — Anesthesia Postprocedure Evaluation (Signed)
 Anesthesia Post Note  Patient: Peggy Franklin  Procedure(s) Performed: COLONOSCOPY  Patient location during evaluation: Endoscopy Anesthesia Type: General Level of consciousness: awake and alert Pain management: pain level controlled Vital Signs Assessment: post-procedure vital signs reviewed and stable Respiratory status: spontaneous breathing, nonlabored ventilation and respiratory function stable Cardiovascular status: stable Anesthetic complications: no   There were no known notable events for this encounter.   Last Vitals:  Vitals:   05/12/24 0759 05/12/24 0910  BP: 111/75 (!) 100/54  Pulse:  74  Resp: 20 18  Temp: 36.9 C (!) 36.4 C  SpO2: 98% 100%    Last Pain:  Vitals:   05/12/24 0910  TempSrc: Oral  PainSc: 0-No pain                 Kahlan Engebretson L Anslie Spadafora

## 2024-05-12 NOTE — Op Note (Signed)
 [facility] Patient Name: Peggy Franklin Procedure Date: 05/12/2024 8:31 AM MRN: [id number] Date of Birth: [birth year] Attending MD: Carlin POUR. Cindie , [place], [id number] CSN: [id number] Age: 53 Admit Type: Outpatient Procedure:                Colonoscopy Indications:              Abnormal CT of the GI tract, Follow-up of                            diverticulitis Providers:                Carlin POUR. Cindie, DO, Tammy Vaught, RN, Leandrew Edelman RN, RN Referring MD:              Medicines:                See the Anesthesia note for documentation of the                            administered medications Complications:            No immediate complications. Estimated Blood Loss:     Estimated blood loss was minimal. Procedure:                Pre-Anesthesia Assessment:                           - The anesthesia plan was to use monitored                            anesthesia care (MAC).                           After obtaining informed consent, the colonoscope                            was passed under direct vision. Throughout the                            procedure, the patient's blood pressure, pulse, and                            oxygen saturations were monitored continuously. The                            PCF-HQ190L ([id number]) Peds Colon was introduced                            through the anus and advanced to the the cecum,                            identified by appendiceal orifice and ileocecal                            valve. The colonoscopy was performed without  difficulty. The patient tolerated the procedure                            well. The quality of the bowel preparation was                            evaluated using the BBPS [facility] Bowel Preparation                            Scale) with scores of: Right Colon = 1 (portion of                            mucosa seen, but other areas not well seen due to                             staining, residual stool and/or opaque liquid),                            Transverse Colon = 1 (portion of mucosa seen, but                            other areas not well seen due to staining, residual                            stool and/or opaque liquid) and Left Colon = 2                            (minor amount of residual staining, small fragments                            of stool and/or opaque liquid, but mucosa seen                            well). The total BBPS score equals 4. The quality                            of the bowel preparation was inadequate. Scope In: 8:43:24 AM Scope Out: 9:05:39 AM Scope Withdrawal Time: 0 hours 18 minutes 0 seconds  Total Procedure Duration: 0 hours 22 minutes 15 seconds  Findings:      Hemorrhoids were found on perianal exam.      Multiple large-mouthed and small-mouthed diverticula were found in the       sigmoid colon. Erythema and congestion was seen from 15-23 cm from the       anal verge. Biopsies were taken with a cold forceps for histology.      A 5 mm polyp was found in the cecum. The polyp was sessile. The polyp       was removed with a cold snare. Resection and retrieval were complete.      An 8 mm polyp was found in the recto-sigmoid colon. The polyp was       sessile. The polyp was removed with a cold snare. Resection and       retrieval were complete.  Extensive amounts of semi-solid stool was found in the descending colon,       in the transverse colon, in the ascending colon and in the cecum,       precluding visualization. Lavage of the area was performed using copious       amounts of sterile water , resulting in incomplete clearance with       continued poor visualization. Impression:               - Preparation of the colon was inadequate.                           - Hemorrhoids found on perianal exam.                           - Diverticulosis in the sigmoid colon. Erythema was                             seen in association with the diverticular opening.                            Biopsied.                           - One 5 mm polyp in the cecum, removed with a cold                            snare. Resected and retrieved.                           - One 8 mm polyp at the recto-sigmoid colon,                            removed with a cold snare. Resected and retrieved.                           - Stool in the descending colon, in the transverse                            colon, in the ascending colon and in the cecum. Moderate Sedation:      Per Anesthesia Care Recommendation:           - Patient has a contact number available for                            emergencies. The signs and symptoms of potential                            delayed complications were discussed with the                            patient. Return to normal activities tomorrow.                            Written discharge instructions were provided to the  patient.                           - High fiber diet.                           - Continue present medications.                           - Await pathology results.                           - Repeat colonoscopy in 4-6 months because the                            bowel preparation was poor.                           - Return to GI office in 3 months. Procedure Code(s):        --- Professional ---                           [phone], Colonoscopy, flexible; with removal of                            tumor(s), polyp(s), or other lesion(s) by snare                            technique                           45380, 59, Colonoscopy, flexible; with biopsy,                            single or multiple Diagnosis Code(s):        --- Professional ---                           K64.9, Unspecified hemorrhoids                           D12.0, Benign neoplasm of cecum                           D12.7, Benign neoplasm of rectosigmoid junction                            K57.32, Diverticulitis of large intestine without                            perforation or abscess without bleeding                           K57.30, Diverticulosis of large intestine without                            perforation or abscess without bleeding  R93.3, Abnormal findings on diagnostic imaging of                            other parts of digestive tract CPT copyright 2022 American Medical Association. All rights reserved. The codes documented in this report are preliminary and upon coder review may  be revised to meet current compliance requirements. Carlin POUR. Cindie, DO Carlin POUR. Cindie, DO 05/12/2024 9:12:23 AM This report has been signed electronically. Number of Addenda: 0

## 2024-05-12 NOTE — H&P (Signed)
 Primary Care Physician:  Alphonsa Glendia LABOR, MD Primary Gastroenterologist:  Dr. Cindie  Pre-Procedure History & Physical: HPI:  Peggy Franklin is a 53 y.o. female is here for a colonoscopy to be performed for follow-up of sigmoid diverticulitis, left lower quadrant abdominal pain, abnormal CT colon.  Past Medical History:  Diagnosis Date   Cancer [facility])    Diverticulitis    GERD (gastroesophageal reflux disease)    History of asplenia 07/10/2019   Hyperlipidemia 04/03/2022    Past Surgical History:  Procedure Laterality Date   ABDOMINAL HYSTERECTOMY     COLON SURGERY     EXCISION VAGINAL CYST Left 03/11/2022   Procedure: EXCISION SEBACEOUS CYST;  Surgeon: Jayne Vonn DEL, MD;  Location: [facility];  Service: Gynecology;  Laterality: Left;   INCISION AND DRAINAGE ABSCESS N/A 06/24/2023   Procedure: REMOVAL FOREIGN BODY ABDOMINAL;  Surgeon: Kallie Manuelita BROCKS, MD;  Location: [facility];  Service: General;  Laterality: N/A;   LAPAROSCOPIC BILATERAL SALPINGO OOPHERECTOMY Bilateral    REMOVAL OF GASTROINTESTINAL STOMATIC  TUMOR OF STOMACH     SMALL INTESTINE SURGERY     SPLENECTOMY, TOTAL     VULVAR LESION REMOVAL N/A 03/11/2022   Procedure: VULVAR BIOPSY WITH LASER ABLATION OF VULVAR;  Surgeon: Jayne Vonn DEL, MD;  Location: [facility];  Service: Gynecology;  Laterality: N/A;  pt knows to arrive at 9:00    Prior to Admission medications   Medication Sig Start Date End Date Taking? Authorizing Provider  pantoprazole  (PROTONIX ) 40 MG tablet TAKE 1 TABLET(40 MG) BY MOUTH DAILY 04/07/23  Yes Alphonsa Glendia LABOR, MD  Sodium Sulfate-Mag Sulfate-KCl (SUTAB ) [phone] MG TABS As directed 04/12/24   Cindie Carlin POUR, DO    Allergies as of 04/12/2024 - Review Complete 03/15/2024  Allergen Reaction Noted   Other Hives 01/20/2013    Family History  Problem Relation Age of Onset   Emphysema Paternal Grandfather    Stroke Paternal Grandmother    Cancer Maternal Grandmother        renal and uterine cancer    Heart attack Maternal Grandmother    Other Maternal Grandfather        accident   Cancer Mother 48       pancreatic cancer   Other Sister        house fire   Heart attack Sister    Heart attack Daughter     Social History   Socioeconomic History   Marital status: Media Planner    Spouse name: Not on file   Number of children: Not on file   Years of education: Not on file   Highest education level: Not on file  Occupational History   Not on file  Tobacco Use   Smoking status: Every Day    Current packs/day: 0.50    Average packs/day: 0.5 packs/day for 11.1 years (5.5 ttl pk-yrs)    Types: Cigarettes    Start date: 04/06/2013   Smokeless tobacco: Never   Tobacco comments:    Smokes E cig  Vaping Use   Vaping status: Never Used  Substance and Sexual Activity   Alcohol use: No   Drug use: No   Sexual activity: Yes    Birth control/protection: Surgical    Comment: hyst  Other Topics Concern   Not on file  Social History Narrative   Not on file   Social Drivers of Health   Financial Resource Strain: Not on file  Food Insecurity: Not on file  Transportation Needs:  Not on file  Physical Activity: Not on file  Stress: Not on file  Social Connections: Not on file  Intimate Partner Violence: Not on file    Review of Systems: See HPI, otherwise negative ROS  Physical Exam: Vital signs in last 24 hours: Temp:  [98.4 F (36.9 C)] 98.4 F (36.9 C) (11/21 0759) Resp:  [20] 20 (11/21 0759) BP: (111)/(75) 111/75 (11/21 0759) SpO2:  [98 %] 98 % (11/21 0759) Weight:  [55 kg] 55 kg (11/21 0759)   General:   Alert,  Well-developed, well-nourished, pleasant and cooperative in NAD Head:  Normocephalic and atraumatic. Eyes:  Sclera clear, no icterus.   Conjunctiva pink. Ears:  Normal auditory acuity. Nose:  No deformity, discharge,  or lesions. Msk:  Symmetrical without gross deformities. Normal posture. Extremities:  Without clubbing or edema. Neurologic:   Alert and  oriented x4;  grossly normal neurologically. Skin:  Intact without significant lesions or rashes. Psych:  Alert and cooperative. Normal mood and affect.  Impression/Plan: Peggy Franklin is here for a colonoscopy to be performed for follow-up of sigmoid diverticulitis, left lower quadrant abdominal pain, abnormal CT colon.  The risks of the procedure including infection, bleed, or perforation as well as benefits, limitations, alternatives and imponderables have been reviewed with the patient. Questions have been answered. All parties agreeable.

## 2024-05-15 LAB — SURGICAL PATHOLOGY

## 2024-05-16 ENCOUNTER — Encounter (HOSPITAL_COMMUNITY): Payer: Self-pay | Admitting: Internal Medicine

## 2024-05-24 ENCOUNTER — Encounter: Payer: Self-pay | Admitting: Emergency Medicine

## 2024-05-24 ENCOUNTER — Ambulatory Visit
Admission: EM | Admit: 2024-05-24 | Discharge: 2024-05-24 | Disposition: A | Discharge status: Home / Self Care | Attending: Family Medicine | Admitting: Family Medicine

## 2024-05-24 DIAGNOSIS — J069 Acute upper respiratory infection, unspecified: Secondary | ICD-10-CM

## 2024-05-24 MED ORDER — ALBUTEROL SULFATE HFA 108 (90 BASE) MCG/ACT IN AERS: 2.0000 | INHALATION_SPRAY | RESPIRATORY_TRACT | 0 refills | Status: DC | PRN

## 2024-05-24 MED ORDER — PROMETHAZINE-DM 6.25-15 MG/5ML PO SYRP: 5.0000 mL | ORAL_SOLUTION | Freq: Four times a day (QID) | ORAL | 0 refills | Status: DC | PRN

## 2024-05-24 MED ORDER — AZELASTINE HCL 0.1 % NA SOLN: 1.0000 | Freq: Two times a day (BID) | NASAL | 0 refills | Status: DC

## 2024-05-24 NOTE — ED Provider Notes (Signed)
 [facility]    CSN: [id number] Arrival date & time: 05/24/24  9188      History   Chief Complaint No chief complaint on file.   HPI Peggy Franklin is a 53 y.o. female.   Patient presenting today with dry hacking cough worse with laying down, sinus drainage, nasal congestion, scratchy throat, fatigue x 3 to 4 days.  Denies fever, chills, chest pain, shortness of breath, abdominal pain, vomiting, diarrhea.  So far trying Mucinex and DayQuil with only mild temporary benefit.  No known diagnosed chronic pulmonary disease, current smoker.    Past Medical History:  Diagnosis Date   Cancer (HCC)    Diverticulitis    GERD (gastroesophageal reflux disease)    History of asplenia 07/10/2019   Hyperlipidemia 04/03/2022    Patient Active Problem List   Diagnosis Date Noted   Furuncle of umbilicus 06/24/2023   Cellulitis of umbilicus 05/11/2023   Open wound of umbilical region 04/18/2023   Depression with anxiety 05/08/2022   PTSD (post-traumatic stress disorder) 05/08/2022   Hyperlipidemia 04/03/2022   VIN III (vulvar intraepithelial neoplasia III)    Sebaceous cyst    Lichen planus 01/21/2022   Vesicle of skin 01/21/2022   Aortic atherosclerosis 01/01/2022   Smoker 01/01/2022   Sore throat 09/24/2020   Non-recurrent acute suppurative otitis media of right ear without spontaneous rupture of tympanic membrane 09/24/2020   Seasonal allergic rhinitis due to pollen 09/24/2020   COVID-19 virus infection 07/12/2020   Close exposure to 2019 novel coronavirus 07/10/2020   Diarrhea 05/30/2020   Cough 02/05/2020   Head congestion 02/05/2020   History of asplenia 07/10/2019   Genital warts 04/08/2018   Anxiety 04/08/2018   Anal skin tag 03/28/2015   Gastroesophageal reflux disease without esophagitis 03/07/2015   Postsurgical dumping syndrome 01/20/2013    Past Surgical History:  Procedure Laterality Date   ABDOMINAL HYSTERECTOMY     COLON SURGERY     COLONOSCOPY  N/A 05/12/2024   Procedure: COLONOSCOPY;  Surgeon: Cindie Carlin POUR, DO;  Location: [facility];  Service: Endoscopy;  Laterality: N/A;  900am, asa 2   EXCISION VAGINAL CYST Left 03/11/2022   Procedure: EXCISION SEBACEOUS CYST;  Surgeon: Jayne Vonn DEL, MD;  Location: [facility];  Service: Gynecology;  Laterality: Left;   INCISION AND DRAINAGE ABSCESS N/A 06/24/2023   Procedure: REMOVAL FOREIGN BODY ABDOMINAL;  Surgeon: Kallie Manuelita BROCKS, MD;  Location: [facility];  Service: General;  Laterality: N/A;   LAPAROSCOPIC BILATERAL SALPINGO OOPHERECTOMY Bilateral    REMOVAL OF GASTROINTESTINAL STOMATIC  TUMOR OF STOMACH     SMALL INTESTINE SURGERY     SPLENECTOMY, TOTAL     VULVAR LESION REMOVAL N/A 03/11/2022   Procedure: VULVAR BIOPSY WITH LASER ABLATION OF VULVAR;  Surgeon: Jayne Vonn DEL, MD;  Location: [facility];  Service: Gynecology;  Laterality: N/A;  pt knows to arrive at 9:00    OB History     Gravida  3   Para  3   Term  2   Preterm  1   AB      Living  2      SAB      IAB      Ectopic      Multiple      Live Births  1            Home Medications    Prior to Admission medications   Medication Sig Start Date End Date Taking? Authorizing Provider  albuterol  (VENTOLIN  HFA) 108 (90 Base) MCG/ACT inhaler Inhale 2 puffs into the lungs every 4 (four) hours as needed. 05/24/24  Yes Stuart Vernell Norris, PA-C  azelastine (ASTELIN) 0.1 % nasal spray Place 1 spray into both nostrils 2 (two) times daily. Use in each nostril as directed 05/24/24  Yes Stuart Vernell Norris, PA-C  promethazine -dextromethorphan (PROMETHAZINE -DM) 6.25-15 MG/5ML syrup Take 5 mLs by mouth 4 (four) times daily as needed. 05/24/24  Yes Stuart Vernell Norris, PA-C  pantoprazole  (PROTONIX ) 40 MG tablet TAKE 1 TABLET(40 MG) BY MOUTH DAILY 04/07/23   Alphonsa Glendia LABOR, MD    Family History Family History  Problem Relation Age of Onset   Emphysema Paternal Grandfather    Stroke Paternal Grandmother     Cancer Maternal Grandmother        renal and uterine cancer   Heart attack Maternal Grandmother    Other Maternal Grandfather        accident   Cancer Mother 37       pancreatic cancer   Other Sister        house fire   Heart attack Sister    Heart attack Daughter     Social History Social History   Tobacco Use   Smoking status: Every Day    Current packs/day: 0.50    Average packs/day: 0.5 packs/day for 11.1 years (5.6 ttl pk-yrs)    Types: Cigarettes    Start date: 04/06/2013   Smokeless tobacco: Never   Tobacco comments:    Smokes E cig  Vaping Use   Vaping status: Never Used  Substance Use Topics   Alcohol use: No   Drug use: No     Allergies   Other   Review of Systems Review of Systems Per HPI  Physical Exam Triage Vital Signs ED Triage Vitals  Encounter Vitals Group     BP 05/24/24 0817 113/70     Girls Systolic BP Percentile --      Girls Diastolic BP Percentile --      Boys Systolic BP Percentile --      Boys Diastolic BP Percentile --      Pulse Rate 05/24/24 0817 82     Resp 05/24/24 0817 18     Temp 05/24/24 0817 98.1 F (36.7 C)     Temp Source 05/24/24 0817 Oral     SpO2 05/24/24 0817 96 %     Weight --      Height --      Head Circumference --      Peak Flow --      Pain Score 05/24/24 0818 0     Pain Loc --      Pain Education --      Exclude from Growth Chart --    No data found.  Updated Vital Signs BP 113/70 (BP Location: Right Arm)   Pulse 82   Temp 98.1 F (36.7 C) (Oral)   Resp 18   SpO2 96%   Visual Acuity Right Eye Distance:   Left Eye Distance:   Bilateral Distance:    Right Eye Near:   Left Eye Near:    Bilateral Near:     Physical Exam Vitals and nursing note reviewed.  Constitutional:      Appearance: Normal appearance.  HENT:     Head: Atraumatic.     Right Ear: Tympanic membrane and external ear normal.     Left Ear: Tympanic membrane and external ear normal.  Nose: Rhinorrhea present.      Mouth/Throat:     Mouth: Mucous membranes are moist.     Pharynx: Posterior oropharyngeal erythema present.  Eyes:     Extraocular Movements: Extraocular movements intact.     Conjunctiva/sclera: Conjunctivae normal.  Cardiovascular:     Rate and Rhythm: Normal rate and regular rhythm.     Heart sounds: Normal heart sounds.  Pulmonary:     Effort: Pulmonary effort is normal.     Breath sounds: Normal breath sounds. No wheezing or rales.  Musculoskeletal:        General: Normal range of motion.     Cervical back: Normal range of motion and neck supple.  Skin:    General: Skin is warm and dry.  Neurological:     Mental Status: She is alert and oriented to person, place, and time.  Psychiatric:        Mood and Affect: Mood normal.        Thought Content: Thought content normal.      UC Treatments / Results  Labs (all labs ordered are listed, but only abnormal results are displayed) Labs Reviewed - No data to display  EKG   Radiology No results found.  Procedures Procedures (including critical care time)  Medications Ordered in UC Medications - No data to display  Initial Impression / Assessment and Plan / UC Course  I have reviewed the triage vital signs and the nursing notes.  Pertinent labs & imaging results that were available during my care of the patient were reviewed by me and considered in my medical decision making (see chart for details).     Vital signs and exam reassuring today, suspect viral respiratory infection.  Will treat with Phenergan  DM, Astelin, albuterol , supportive over-the-counter medications and home care.  Return for worsening or unresolving symptoms.  Final Clinical Impressions(s) / UC Diagnoses   Final diagnoses:  Viral URI with cough   Discharge Instructions   None    ED Prescriptions     Medication Sig Dispense Auth. Provider   albuterol  (VENTOLIN  HFA) 108 (90 Base) MCG/ACT inhaler Inhale 2 puffs into the lungs every 4 (four)  hours as needed. 18 g Stuart Vernell Norris, PA-C   promethazine -dextromethorphan (PROMETHAZINE -DM) 6.25-15 MG/5ML syrup Take 5 mLs by mouth 4 (four) times daily as needed. 100 mL Stuart Vernell Norris, PA-C   azelastine (ASTELIN) 0.1 % nasal spray Place 1 spray into both nostrils 2 (two) times daily. Use in each nostril as directed 30 mL Stuart Vernell Norris, PA-C      PDMP not reviewed this encounter.   Stuart Vernell Norris, [place] 05/24/24 [phone]

## 2024-05-24 NOTE — ED Triage Notes (Signed)
 Dry cough since Sunday.  States nose has been running.  Has been taking mucinex and dayquil.

## 2024-05-30 ENCOUNTER — Ambulatory Visit: Admitting: Nurse Practitioner

## 2024-05-30 VITALS — BP 111/75 | HR 81 | Temp 89.1°F | Ht 61.0 in | Wt 128.1 lb

## 2024-05-30 DIAGNOSIS — R490 Dysphonia: Secondary | ICD-10-CM

## 2024-05-30 DIAGNOSIS — B9689 Other specified bacterial agents as the cause of diseases classified elsewhere: Secondary | ICD-10-CM

## 2024-05-30 MED ORDER — PROMETHAZINE-DM 6.25-15 MG/5ML PO SYRP: 5.0000 mL | ORAL_SOLUTION | Freq: Four times a day (QID) | ORAL | 0 refills | Status: AC | PRN

## 2024-05-30 MED ORDER — AZITHROMYCIN 250 MG PO TABS: ORAL_TABLET | ORAL | 0 refills | Status: AC

## 2024-06-01 ENCOUNTER — Telehealth: Payer: Self-pay | Admitting: Family Medicine

## 2024-06-01 MED ORDER — PANTOPRAZOLE SODIUM 40 MG PO TBEC: DELAYED_RELEASE_TABLET | ORAL | 3 refills | Status: AC

## 2024-06-01 NOTE — Progress Notes (Unsigned)
 Subjective:    Patient ID: Peggy Franklin, female    DOB: 1970/12/09, 53 y.o.   MRN: [id number]  HPI Discussed the use of AI scribe software for clinical note transcription with the patient, who gave verbal consent to proceed.  History of Present Illness Peggy Franklin is a 53 year old female who presents with persistent cough and hoarseness.  She began feeling unwell on Monday and Tuesday of last week, with symptoms worsening by Wednesday, prompting a visit to urgent care. She was prescribed cough syrup but no antibiotics, and no tests for strep, flu, or COVID were conducted. Her cough is severe enough to cause choking and dry heaving, though the cough syrup has helped with nausea and sleep.  She experienced a fever of 100F on Thursday and Friday last week, but it has since resolved. Her cough has improved, and she denies any mucus production. No ear pain, but had a mild sore throat last week, which is now primarily hoarseness. Her throat feels scratchy if she doesn't drink fluids for a couple of hours. No chest pain, shortness of breath, or wheezing, and has not needed to use her inhaler.  She mentions a lack of appetite but is maintaining fluid intake. Nausea was severe on Sunday but has improved since. She is a smoker, typically half a pack a day, but has not been smoking recently. No recent vomiting, ear pain, or significant sore throat. She has experienced chills and feeling cold at times.  She is currently taking Protonix  daily, which she reports is controlling her GERD symptoms well.        Objective:   Physical Exam Vitals and nursing note reviewed.  Constitutional:      General: She is not in acute distress. HENT:     Ears:     Comments: TMs clear effusion. No erythema.     Mouth/Throat:     Mouth: Mucous membranes are moist.     Pharynx: Oropharynx is clear.     Comments: Obvious hoarseness noted.  Neck:     Comments: Mild anterior cervical adenopathy noted.   Cardiovascular:     Rate and Rhythm: Normal rate.  Pulmonary:     Effort: Pulmonary effort is normal.     Breath sounds: Normal breath sounds.  Abdominal:     General: There is no distension.     Palpations: Abdomen is soft.     Tenderness: There is no abdominal tenderness.  Musculoskeletal:     Cervical back: Neck supple.  Lymphadenopathy:     Cervical: Cervical adenopathy present.  Neurological:     Mental Status: She is alert and oriented to person, place, and time.  Psychiatric:        Mood and Affect: Mood normal.        Behavior: Behavior normal.        Thought Content: Thought content normal.           Assessment & Plan:  1. Bacterial URI (Primary) Suspected viral etiology with consideration of bacterial infection due to persistent symptoms and hoarseness. - Prescribed antibiotics for throat and sinus infection. - Advised warm salt water  gargles. - Recommended menthol cough drops. - Refilled promethazine  cough syrup. - Instructed to monitor for worsening symptoms. - azithromycin  (ZITHROMAX  Z-PAK) 250 MG tablet; Take 2 tablets (500 mg) on  Day 1,  followed by 1 tablet (250 mg) once daily on Days 2 through 5.  Dispense: 6 each; Refill: 0 - promethazine -dextromethorphan (PROMETHAZINE -DM) 6.25-15 MG/5ML  syrup; Take 5 mLs by mouth 4 (four) times daily as needed.  Dispense: 100 mL; Refill: 0  2. Laryngitis, acute Suspected viral etiology with consideration of bacterial infection due to persistent symptoms and hoarseness. - Prescribed antibiotics for throat and sinus infection. - Advised warm salt water  gargles. - Recommended menthol cough drops. - Refilled promethazine  cough syrup. - Instructed to monitor for worsening symptoms. Recheck if worsens or persists.   3. Gastroesophageal reflux disease without esophagitis Symptoms well-controlled with Protonix . - Refilled Protonix  prescription. - pantoprazole  (PROTONIX ) 40 MG tablet; TAKE 1 TABLET(40 MG) BY MOUTH DAILY   Dispense: 90 tablet; Refill: 3  Return if symptoms worsen or fail to improve.

## 2024-06-01 NOTE — Telephone Encounter (Signed)
Refill Pantoprazole 40 mg

## 2024-06-03 ENCOUNTER — Encounter: Payer: Self-pay | Admitting: Nurse Practitioner

## 2024-06-05 ENCOUNTER — Ambulatory Visit: Admitting: Podiatry

## 2024-06-05 ENCOUNTER — Encounter: Payer: Self-pay | Admitting: Podiatry

## 2024-06-05 VITALS — Ht 61.0 in | Wt 128.1 lb

## 2024-06-05 DIAGNOSIS — D2372 Other benign neoplasm of skin of left lower limb, including hip: Secondary | ICD-10-CM

## 2024-06-05 NOTE — Progress Notes (Signed)
° °  Chief Complaint  Patient presents with   Plantar Warts    Pt is here due to spots on the bottom of both feet that are causing pain, wants to have them shaved off.    Subjective: 53 y.o. female presenting to the office today for evaluation of pain and tenderness associated to a symptomatic skin lesion to the plantar aspect of the left foot.   Past Medical History:  Diagnosis Date   Cancer [facility])    Diverticulitis    GERD (gastroesophageal reflux disease)    History of asplenia 07/10/2019   Hyperlipidemia 04/03/2022    Past Surgical History:  Procedure Laterality Date   ABDOMINAL HYSTERECTOMY     COLON SURGERY     COLONOSCOPY N/A 05/12/2024   Procedure: COLONOSCOPY;  Surgeon: Cindie Carlin POUR, DO;  Location: [facility];  Service: Endoscopy;  Laterality: N/A;  900am, asa 2   EXCISION VAGINAL CYST Left 03/11/2022   Procedure: EXCISION SEBACEOUS CYST;  Surgeon: Jayne Vonn DEL, MD;  Location: [facility];  Service: Gynecology;  Laterality: Left;   INCISION AND DRAINAGE ABSCESS N/A 06/24/2023   Procedure: REMOVAL FOREIGN BODY ABDOMINAL;  Surgeon: Kallie Manuelita BROCKS, MD;  Location: [facility];  Service: General;  Laterality: N/A;   LAPAROSCOPIC BILATERAL SALPINGO OOPHERECTOMY Bilateral    REMOVAL OF GASTROINTESTINAL STOMATIC  TUMOR OF STOMACH     SMALL INTESTINE SURGERY     SPLENECTOMY, TOTAL     VULVAR LESION REMOVAL N/A 03/11/2022   Procedure: VULVAR BIOPSY WITH LASER ABLATION OF VULVAR;  Surgeon: Jayne Vonn DEL, MD;  Location: [facility];  Service: Gynecology;  Laterality: N/A;  pt knows to arrive at 9:00    Allergies  Allergen Reactions   Other Hives    Steroids      Objective:  Physical Exam General: Alert and oriented x3 in no acute distress  Dermatology: Hyperkeratotic lesion(s) present on the plantar aspect of the bilateral foot. Pain on palpation with a central nucleated core noted. Skin is warm, dry and supple bilateral lower extremities. Negative for open lesions or  macerations.  Vascular: Palpable pedal pulses bilaterally. No edema or erythema noted. Capillary refill within normal limits.  Neurological: Grossly intact via light touch  Musculoskeletal Exam: Pain on palpation at the keratotic lesion(s) noted. Range of motion within normal limits bilateral. Muscle strength 5/5 in all groups bilateral.  Assessment: 1.  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  Plantar aspect of the left fifth MTP   Plan of Care:  -Patient evaluated -Excisional debridement of keratoic lesion(s) using a chisel blade was performed without incident.  -Salicylic acid applied with a bandaid -Return to the clinic PRN.   Thresa EMERSON Sar, DPM [facility]  Dr. Thresa EMERSON Sar, DPM    [address] [place], [place] [address]                Office [phone]  Fax [phone]

## 2024-06-06 ENCOUNTER — Ambulatory Visit: Admitting: Family Medicine

## 2024-06-28 ENCOUNTER — Ambulatory Visit: Payer: Self-pay | Admitting: Internal Medicine

## 2024-07-19 ENCOUNTER — Encounter: Payer: Self-pay | Admitting: Family Medicine

## 2024-07-28 ENCOUNTER — Encounter (INDEPENDENT_AMBULATORY_CARE_PROVIDER_SITE_OTHER): Payer: Self-pay | Admitting: *Deleted
